# Patient Record
Sex: Male | Born: 1950 | Race: Black or African American | Hispanic: No | Marital: Married | Smoking: Never smoker
Health system: Southern US, Community
[De-identification: ages and names within clinical notes are randomized; demographics above are authoritative.]

## PROBLEM LIST (undated history)

## (undated) DIAGNOSIS — I10 Essential (primary) hypertension: Secondary | ICD-10-CM

## (undated) DIAGNOSIS — E7800 Pure hypercholesterolemia, unspecified: Secondary | ICD-10-CM

## (undated) DIAGNOSIS — R339 Retention of urine, unspecified: Secondary | ICD-10-CM

## (undated) DIAGNOSIS — Z978 Presence of other specified devices: Secondary | ICD-10-CM

---

## 2019-07-21 ENCOUNTER — Other Ambulatory Visit: Payer: Self-pay | Admitting: Family

## 2019-07-21 ENCOUNTER — Ambulatory Visit
Admission: RE | Admit: 2019-07-21 | Discharge: 2019-07-21 | Disposition: A | Discharge status: Home / Self Care | Payer: Medicare Other | Source: Ambulatory Visit | Attending: Family | Admitting: Family

## 2019-07-21 ENCOUNTER — Emergency Department (HOSPITAL_COMMUNITY)
Admission: EM | Admit: 2019-07-21 | Discharge: 2019-07-21 | Discharge status: Absent without leave | Payer: Medicare Other

## 2019-07-21 ENCOUNTER — Other Ambulatory Visit: Payer: Self-pay

## 2019-07-21 DIAGNOSIS — R29898 Other symptoms and signs involving the musculoskeletal system: Secondary | ICD-10-CM

## 2019-07-21 DIAGNOSIS — M545 Low back pain, unspecified: Secondary | ICD-10-CM

## 2019-08-01 ENCOUNTER — Other Ambulatory Visit: Payer: Self-pay

## 2019-11-04 ENCOUNTER — Inpatient Hospital Stay (HOSPITAL_COMMUNITY)
Admission: EM | Admit: 2019-11-04 | Discharge: 2019-11-10 | DRG: 948 | Disposition: A | Discharge status: Home / Self Care | Payer: Medicare Other | Attending: Internal Medicine | Admitting: Internal Medicine

## 2019-11-04 ENCOUNTER — Emergency Department (HOSPITAL_COMMUNITY): Payer: Medicare Other

## 2019-11-04 ENCOUNTER — Encounter (HOSPITAL_COMMUNITY): Payer: Self-pay | Admitting: Emergency Medicine

## 2019-11-04 ENCOUNTER — Other Ambulatory Visit: Payer: Self-pay

## 2019-11-04 DIAGNOSIS — E785 Hyperlipidemia, unspecified: Secondary | ICD-10-CM

## 2019-11-04 DIAGNOSIS — R531 Weakness: Secondary | ICD-10-CM

## 2019-11-04 DIAGNOSIS — G379 Demyelinating disease of central nervous system, unspecified: Secondary | ICD-10-CM | POA: Diagnosis present

## 2019-11-04 DIAGNOSIS — Z79899 Other long term (current) drug therapy: Secondary | ICD-10-CM

## 2019-11-04 DIAGNOSIS — N400 Enlarged prostate without lower urinary tract symptoms: Secondary | ICD-10-CM

## 2019-11-04 DIAGNOSIS — R296 Repeated falls: Secondary | ICD-10-CM | POA: Diagnosis present

## 2019-11-04 DIAGNOSIS — Z20822 Contact with and (suspected) exposure to covid-19: Secondary | ICD-10-CM | POA: Diagnosis present

## 2019-11-04 DIAGNOSIS — R29898 Other symptoms and signs involving the musculoskeletal system: Secondary | ICD-10-CM

## 2019-11-04 DIAGNOSIS — I10 Essential (primary) hypertension: Secondary | ICD-10-CM

## 2019-11-04 DIAGNOSIS — C799 Secondary malignant neoplasm of unspecified site: Secondary | ICD-10-CM

## 2019-11-04 DIAGNOSIS — R269 Unspecified abnormalities of gait and mobility: Secondary | ICD-10-CM | POA: Diagnosis present

## 2019-11-04 DIAGNOSIS — E7800 Pure hypercholesterolemia, unspecified: Secondary | ICD-10-CM | POA: Diagnosis present

## 2019-11-04 DIAGNOSIS — G35 Multiple sclerosis: Secondary | ICD-10-CM | POA: Diagnosis not present

## 2019-11-04 HISTORY — DX: Pure hypercholesterolemia, unspecified: E78.00

## 2019-11-04 HISTORY — DX: Essential (primary) hypertension: I10

## 2019-11-04 LAB — URINALYSIS, ROUTINE W REFLEX MICROSCOPIC
Bacteria, UA: NONE SEEN
Bilirubin Urine: NEGATIVE
Glucose, UA: NEGATIVE mg/dL
Hgb urine dipstick: NEGATIVE
Ketones, ur: NEGATIVE mg/dL
Leukocytes,Ua: NEGATIVE
Nitrite: NEGATIVE
Protein, ur: 30 mg/dL — AB
Specific Gravity, Urine: 1.026 (ref 1.005–1.030)
pH: 5 (ref 5.0–8.0)

## 2019-11-04 LAB — BASIC METABOLIC PANEL
Anion gap: 10 (ref 5–15)
BUN: 15 mg/dL (ref 8–23)
CO2: 27 mmol/L (ref 22–32)
Calcium: 9.8 mg/dL (ref 8.9–10.3)
Chloride: 104 mmol/L (ref 98–111)
Creatinine, Ser: 1.14 mg/dL (ref 0.61–1.24)
GFR calc Af Amer: >60 mL/min (ref >60)
GFR calc non Af Amer: >60 mL/min (ref >60)
Glucose, Bld: 89 mg/dL (ref 70–99)
Potassium: 4.9 mmol/L (ref 3.5–5.1)
Sodium: 141 mmol/L (ref 135–145)

## 2019-11-04 LAB — CBC WITH DIFFERENTIAL/PLATELET
Abs Immature Granulocytes: 0.02 10*3/uL (ref 0.00–0.07)
Basophils Absolute: 0 10*3/uL (ref 0.0–0.1)
Basophils Relative: 0 %
Eosinophils Absolute: 0.1 10*3/uL (ref 0.0–0.5)
Eosinophils Relative: 1 %
HCT: 50.9 % (ref 39.0–52.0)
Hemoglobin: 16.6 g/dL (ref 13.0–17.0)
Immature Granulocytes: 0 %
Lymphocytes Relative: 37 %
Lymphs Abs: 2.6 10*3/uL (ref 0.7–4.0)
MCH: 29 pg (ref 26.0–34.0)
MCHC: 32.6 g/dL (ref 30.0–36.0)
MCV: 88.8 fL (ref 80.0–100.0)
Monocytes Absolute: 0.4 10*3/uL (ref 0.1–1.0)
Monocytes Relative: 6 %
Neutro Abs: 4 10*3/uL (ref 1.7–7.7)
Neutrophils Relative %: 56 %
Platelets: 194 10*3/uL (ref 150–400)
RBC: 5.73 MIL/uL (ref 4.22–5.81)
RDW: 12.3 % (ref 11.5–15.5)
WBC: 7.1 10*3/uL (ref 4.0–10.5)
nRBC: 0 % (ref 0.0–0.2)

## 2019-11-04 MED ORDER — SODIUM CHLORIDE 0.9 % IV SOLN
1000.0000 mg | Freq: Once | INTRAVENOUS | Status: DC
  Filled 2019-11-04: qty 8

## 2019-11-04 MED ORDER — SODIUM CHLORIDE 0.9 % IV SOLN
2.0000 g | Freq: Once | INTRAVENOUS | Status: AC
  Administered 2019-11-04: 2 g via INTRAVENOUS
  Filled 2019-11-04: qty 20

## 2019-11-04 MED ORDER — GADOBUTROL 1 MMOL/ML IV SOLN
10.0000 mL | Freq: Once | INTRAVENOUS | Status: AC | PRN
  Administered 2019-11-04: 9 mL via INTRAVENOUS

## 2019-11-04 NOTE — H&P (Signed)
History and Physical    Bryan Robertson ZDG:[id number] DOB: 08-10-1950 DOA: 11/04/2019  PCP: Assunta Curtis, FNP Patient coming from: Home  Chief Complaint: Right leg weakness  HPI: Bryan Robertson is a 69 y.o. 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  Patient states during his hospitalization at [facility] he was treated with a steroid and felt better afterwards.  States for the past 1 week his symptoms have started again.  He is experiencing weakness in his right leg and numbness in his right hand.  States the numbness has now improved.  He is able to move his right leg but it continues to be weak and he is using a walker to ambulate.  Denies any changes in vision or difficulty with his speech.  He has been vaccinated against Covid.  Denies cough, shortness of breath, nausea, vomiting, abdominal pain, or diarrhea.  Per review of notes under Care Everywhere: Patient was admitted to [facility] in June with complaints of ataxia and difficulty walking.  He had a tick bite a month prior, was initially treated with doxycycline which he was not able to tolerate, later transitioned to amoxicillin.  MRI of the brain was concerning for wide differential including Lyme neuroborreliosis, acute demyelinating disease, vasculitis, sarcoidosis, and less likely neoplasm.  MRI of cervical and thoracic spine without lesions. Spinal tap was positive for oligoclonal bands and it showed high CSF protein 109.  CSF MD panel negative.  CMV, EBV, varicella, HSV CSF PCR is negative.  CSF bacterial and fungal cultures with no growth and Gram stain negative.  RPR negative, HIV negative.  Lyme disease antibody panel negative.  Lyme CSF serology negative.  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  He was seen by neurology clinic and his presentation was felt to be very atypical for MS.  He had further work-up scheduled with brain and cervical MRI, CT chest/abdomen/pelvis, NMO Ab, and MOG Ab.   Brain MRI done at [facility] on 07/22/2019: "1. Multiple rounded lesions within the periventricular and subcortical white matter, with the largest measuring up to 3.5 cm within the right temporal lobe as described above. Several of the lesions demonstrate peripheral restricted diffusion and heterogeneous enhancement in the bilateral frontal lobes. 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 Additional differential considerations include acute demyelinating disease, vasculitis, or sarcoidosis. Neoplasm such as lymphoma would be in the differential although the imaging appearance is not typical. Metastatic disease is also in the differential but considered less likely given that some of the lesions do not enhance and given lack of substantial surrounding edema.  2. Query slightly asymmetric smooth enhancement of the left facial nerve in the temporal bone as it courses towards the stylomastoid foramen."  ED Course: Blood pressure slightly elevated, remainder of vital signs stable.  WBC 7.1, hemoglobin 16.6, hematocrit 50.9, platelet 194K.  Sodium 141, potassium 4.9, chloride 104, bicarb 27, BUN 15, creatinine 1.1, glucose 89.  UA not suggestive of infection.  SARS-CoV-2 PCR test pending.  MRI brain and C-spine showing findings concerning for demyelinating disease. Neurology consulted. Patient was given ceftriaxone.  Review of Systems:  All systems reviewed and apart from history of presenting illness, are negative.  Past Medical History:  Diagnosis Date  . High cholesterol   . Hypertension      History reviewed. No pertinent surgical history.   reports that he has never smoked. He has never used smokeless tobacco. He reports previous alcohol use. He reports previous drug use.  Allergies  Allergen Reactions  . Doxycycline Nausea Only    History reviewed. No pertinent family history.  Prior to Admission medications   Medication Sig Start Date End Date Taking? Authorizing Provider  atorvastatin (LIPITOR) 20 MG tablet Take 20 mg by mouth daily. 10/21/19   [provider]  bethanechol (URECHOLINE) 25 MG tablet Take 25 mg by mouth 4 (four) times daily. 08/04/19   [provider]  brimonidine (ALPHAGAN) 0.2 % ophthalmic solution 1 drop 3 (three) times daily. 09/04/19   [provider]  [employer] ASPIRIN 325 MG tablet Take 325 mg by mouth daily. 10/03/19   [provider]  dorzolamide-timolol (COSOPT) 22.3-6.8 MG/ML ophthalmic solution Place 1 drop into both eyes 2 (two) times daily.  09/21/19   [provider]  hydrALAZINE (APRESOLINE) 25 MG tablet Take 25 mg by mouth 2 (two) times daily. 07/09/19   [provider]  hydrochlorothiazide (HYDRODIURIL) 12.5 MG tablet Take 12.5 mg by mouth every morning. 10/10/19   [provider]  isosorbide dinitrate (ISORDIL) 20 MG tablet Take 20 mg by mouth 2 (two) times daily. 08/04/19   [provider]  KLOR-CON M20 20 MEQ tablet Take 20 mEq by mouth daily. 10/18/19   [provider]  lisinopril (ZESTRIL) 5 MG tablet Take 5 mg by mouth daily. 10/03/19   [provider]  metoprolol succinate (TOPROL-XL) 25 MG 24 hr tablet Take 25 mg by mouth daily. 08/26/19   [provider]  RHOPRESSA 0.02 % SOLN SMARTSIG:1 Drop(s) In Eye(s) Every Evening 08/04/19   [provider]  tamsulosin (FLOMAX) 0.4 MG CAPS capsule Take 0.8 mg by mouth at bedtime. 09/29/19   [provider]    Physical Exam: Vitals:   11/04/19 1053 11/04/19 1251 11/04/19 2345  BP: (!) 162/115  (!) 144/82 (!) 149/62  Pulse: 78 63 66  Resp: 18 16 18   Temp: 98.3 F (36.8 C)    TempSrc: Oral    SpO2: 100% 98% 96%    Physical Exam Constitutional:      General: He is not in acute distress. HENT:     Head: Normocephalic and atraumatic.  Eyes:     Extraocular Movements: Extraocular movements intact.     Conjunctiva/sclera: Conjunctivae normal.  Cardiovascular:     Rate and Rhythm: Normal rate and regular rhythm.     Pulses: Normal pulses.  Pulmonary:     Effort: Pulmonary effort is normal. No respiratory distress.     Breath sounds: No wheezing or rales.  Abdominal:     General: Bowel sounds are normal. There is no distension.     Palpations: Abdomen is soft.     Tenderness: There is no abdominal tenderness.  Musculoskeletal:        General: No swelling or tenderness.     Cervical back: Normal range of motion and neck supple.  Skin:    General: Skin is warm and dry.  Neurological:     Mental Status: He is alert and oriented to person, place, and time.     Comments: Speech fluent, tongue midline, no obvious facial droop Strength 5 out of  5 in bilateral upper and lower extremities. Sensation to light touch intact throughout.     Labs on Admission: I have personally reviewed following labs and imaging studies  CBC: Recent Labs  Lab 11/04/19 1436  WBC 7.1  NEUTROABS 4.0  HGB 16.6  HCT 50.9  MCV 88.8  PLT 800   Basic Metabolic Panel: Recent Labs  Lab 11/04/19 1436  NA 141  K 4.9  CL 104  CO2 27  GLUCOSE 89  BUN 15  CREATININE 1.14  CALCIUM 9.8   GFR: CrCl cannot be calculated (Unknown ideal weight.). Liver Function Tests: No results for input(s): AST, ALT, ALKPHOS, BILITOT, PROT, ALBUMIN in the last 168 hours. No results for input(s): LIPASE, AMYLASE in the last 168 hours. No results for input(s): AMMONIA in the last 168 hours. Coagulation Profile: No results for input(s): INR, PROTIME in the last 168 hours. Cardiac Enzymes: No results for  input(s): CKTOTAL, CKMB, CKMBINDEX, TROPONINI in the last 168 hours. BNP (last 3 results) No results for input(s): PROBNP in the last 8760 hours. HbA1C: No results for input(s): HGBA1C in the last 72 hours. CBG: No results for input(s): GLUCAP in the last 168 hours. Lipid Profile: No results for input(s): CHOL, HDL, LDLCALC, TRIG, CHOLHDL, LDLDIRECT in the last 72 hours. Thyroid Function Tests: No results for input(s): TSH, T4TOTAL, FREET4, T3FREE, THYROIDAB in the last 72 hours. Anemia Panel: Recent Labs    11/04/19 2234  VITAMINB12 357   Urine analysis:    Component Value Date/Time   COLORURINE YELLOW 11/04/2019 2246   APPEARANCEUR CLEAR 11/04/2019 2246   LABSPEC 1.026 11/04/2019 2246   PHURINE 5.0 11/04/2019 2246   GLUCOSEU NEGATIVE 11/04/2019 2246   HGBUR NEGATIVE 11/04/2019 2246   [place] NEGATIVE 11/04/2019 2246   [place] 11/04/2019 2246   PROTEINUR 30 (A) 11/04/2019 2246   NITRITE NEGATIVE 11/04/2019 2246   LEUKOCYTESUR NEGATIVE 11/04/2019 2246    Radiological Exams on Admission: CT Head Wo Contrast  Result Date: 11/04/2019 CLINICAL DATA:  Right leg weakness x2 weeks EXAM: CT HEAD WITHOUT CONTRAST TECHNIQUE: Contiguous axial images were obtained from the base of the skull through the vertex without intravenous contrast. COMPARISON:  07/21/2019 FINDINGS: Brain: More conspicuous hypoattenuation in the right frontal white matter, right periventricular white matter, and a small focus in posterior left frontal white matter suggesting subacute evolving infarcts. No acute hemorrhage, midline shift, mass, mass effect, or hydrocephalus. Vascular: No hyperdense vessel or unexpected calcification. Skull: Normal. Negative for fracture or focal lesion. Sinuses/Orbits: No acute finding. Other: None IMPRESSION: Progressive cerebral white matter changes as above. No hemorrhage or other acute finding. Electronically Signed   By: Lucrezia Europe M.D.   On: 11/04/2019 14:26   MR  ANGIO HEAD WO CONTRAST  Result Date: 11/05/2019 CLINICAL DATA:  Right leg weakness and right hand numbness EXAM: MRA HEAD WITHOUT CONTRAST TECHNIQUE: Angiographic images of the Circle of Willis were obtained using MRA technique without intravenous contrast. COMPARISON:  None. FINDINGS: POSTERIOR CIRCULATION: --Vertebral arteries: Normal V4 segments. --Inferior cerebellar arteries: Normal. --Basilar artery: Normal. --Superior cerebellar arteries: Normal. --Posterior cerebral arteries: Normal. The right PCA is predominantly supplied by the posterior communicating artery. ANTERIOR CIRCULATION: --Intracranial internal carotid arteries: Normal. --Anterior cerebral arteries (ACA): Normal. Both A1 segments are present. Patent anterior communicating artery (a-comm). --Middle cerebral arteries (MCA): Normal. 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 Electronically Signed   By: Ulyses Jarred M.D.   On: 11/05/2019 00:33   MR Brain W and Wo  Contrast  Result Date: 11/04/2019 CLINICAL DATA:  Myelopathy. Acute or progressive weakness. Concern for multiple sclerosis. EXAM: MRI HEAD WITHOUT AND WITH CONTRAST TECHNIQUE: Multiplanar, multiecho pulse sequences of the brain and surrounding structures were obtained without and with intravenous contrast. CONTRAST:  39mL GADAVIST GADOBUTROL 1 MMOL/ML IV SOLN COMPARISON:  None. FINDINGS: Brain: No acute infarct. There are multiple round/ovoid T2/FLAIR hyperintense lesions in bilateral frontoparietal, right temporal, and left basal ganglia white matter. Most of these lesions are periventricular in location and many of the lesions are along the callososeptal interface. One of the larger lesions along the right periventricular white matter demonstrates restricted diffusion and irregular peripheral enhancement. An additional smaller lesion in the left corona radiata demonstrates peripheral restricted diffusion and mild peripheral enhancement. A lesion in  the right frontal periventricular white matter extends to the subcortical white matter. There are small lesions within the right middle cerebellar peduncle (for example see series 6 and 5, image 8). No substantial mass effect associated with these lesions. No midline shift. No acute hemorrhage. No hydrocephalus. Vascular: Flow voids are maintained at the skull base. Skull and upper cervical spine: Characterized on concurrent MRI of the cervical spine Sinuses/Orbits: Retention versus cyst versus polyp within the right frontal sinus. Scattered ethmoid air cell opacification. Left maxillary sinus and right sphenoid sinus retention cysts. No air-fluid levels. Other: No mastoid effusions IMPRESSION: There are numerous T2/FLAIR hyperintense infratentorial and supratentorial lesions, which are detailed above and compatible with demyelinating disease. A few of the periventricular lesions restrict diffusion and peripherally enhance, compatible with active demyelination. Electronically Signed   By: Margaretha Sheffield MD   On: 11/04/2019 16:56   MR Cervical Spine W or Wo Contrast  Result Date: 11/04/2019 CLINICAL DATA:  Myelopathy, acute or progressive. Concern for multiple sclerosis. EXAM: MRI CERVICAL SPINE WITHOUT AND WITH CONTRAST TECHNIQUE: Multiplanar and multiecho pulse sequences of the cervical spine, to include the craniocervical junction and cervicothoracic junction, were obtained without and with intravenous contrast. CONTRAST:  11mL GADAVIST GADOBUTROL 1 MMOL/ML IV SOLN COMPARISON:  None. FINDINGS: Alignment: Physiologic. Vertebrae: Vertebral body heights are maintained. No focal marrow edema to suggest acute fracture, osteomyelitis, or abnormal bone lesion. Edema associated with right C5-C6 facet arthropathy. Cord: There is short segment, focal, non expansile wT2 hyperintensity within the lateral and dorsal cord at C2-C3 and C3-C4 (see series 14, image 13 and 14) Posterior Fossa, vertebral arteries, paraspinal  tissues: No acute abnormality. Disc levels: C2-C3: No significant disc protrusion, foraminal stenosis, or canal stenosis. C3-C4: Small posterior disc osteophyte complex and left greater than right facet and uncovertebral hypertrophy. Mild left foraminal stenosis. No significant canal stenosis. C4-C5: Small posterior disc osteophyte complex and bilateral facet and uncovertebral hypertrophy. Mild canal stenosis and mild bilateral foraminal stenosis. C5-C6: Small posterior disc osteophyte complex and right greater than left facet and uncovertebral hypertrophy. Moderate right and mild left foraminal stenosis. C6-C7: Right greater than left facet and uncovertebral hypertrophy with mild bilateral foraminal stenosis. No significant canal stenosis. C7-T1: No significant canal or foraminal stenosis. Visualized upper thoracic spine: Incompletely characterized without evidence of significant canal or foraminal stenosis. IMPRESSION: 1. Focal short-segment T2 hyperintensity within the dorsolateral right cord at C2-C3 and C3-C4, which is compatible with demyelinating disease given findings on concurrent MRI head. No clear enhancement in these regions to suggest active demyelination. 2. 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 Electronically Signed   By: Margaretha Sheffield MD  On: 11/04/2019 17:07    Assessment/Plan Principal Problem:   Right leg weakness Active Problems:   Essential hypertension   Hyperlipidemia   BPH (benign prostatic hyperplasia)   Right leg weakness, right hand numbness -Patient presenting with complaints of right lower extremity weakness and right hand numbness.  Exam done by neurology notable for left hand grip weakness and left hip flexion weakness.  MRI brain and C-spine with findings concerning for a demyelinating disease. -Patient was seen by neurology and his presentation was felt to be atypical for MS.  His  presentation was felt to be either due to vasculitis or atypical infection or a paraneoplastic process.  Recommended getting infectious disease involved given borderline RMSF IgG IFA and positive Ehrlichia IgG IFA testing during infectious disease office visit in August to see if that is contributing.  If ID is not concerned about a CNS infection, recommended treating with IV methylprednisolone 1000 mg daily x5 days.  Neurology has ordered MRI head without contrast, CT chest/abdomen/pelvis for malignancy screen, Lyme C6 antibody test and serum, serum paraneoplastic antibody panel, and vitamin B12 level.  Appreciate recommendations. -Patient received ceftriaxone in the ED -discontinue.  Recommended first-line treatment for Ehrlichiosis is doxycycline.  Discussed starting the patient on doxycycline but he refuses stating he was not able to tolerate it in the past due to severe nausea.  Denies any other reaction from doxycycline such as rash or symptoms of anaphylaxis.  Explained to the patient that doxycycline could be prescribed with an antiemetic in case if he experiences nausea but he again adamantly refuses to take this drug under any circumstance.  As such, will start rifampin 300 mg every 12 hours. -Please consult ID in a.m.  Hypertension: Systolic currently in the 140s. -Resume home medications after pharmacy med rec is done  Hyperlipidemia -Resume home statin after pharmacy med rec is done  BPH -Resume home Flomax after pharmacy med rec is done  DVT prophylaxis: Lovenox Code Status: Full code Family Communication: No family available at this time. Disposition Plan: Status is: Inpatient  Remains inpatient appropriate because:IV treatments appropriate due to intensity of illness or inability to take PO and Inpatient level of care appropriate due to severity of illness   Dispo: The patient is from: Home              Anticipated d/c is to: Home              Anticipated d/c date is: 3 days               Patient currently is not medically stable to d/c.  The medical decision making on this patient was of high complexity and the patient is at high risk for clinical deterioration, therefore this is a level 3 visit.  Shela Leff MD [facility]  If 7PM-7AM, please contact night-coverage [email or web address]  11/05/2019, 1:16 AM

## 2019-11-04 NOTE — Consult Note (Addendum)
NEUROLOGY CONSULTATION NOTE   Date of service: November 04, 2019 Patient Name: Bryan Robertson MRN:  [id number] DOB:  1950-03-27 Reason for consult: "MS exacerbation"  History of Present Illness  Bryan Robertson is a 69 y.o. 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  He reports R leg weakness, gait difficult and R hand numbness. Symptom started about a week ago. He was using a walker and had to switch to a cane. Reports that ths felt very similar to prior episode in June, he was admitted ot [facility] for that. Workup with multiple periventricular and subcortical white matter lesions, several of these lesions demonstrated restricted diffusion and heterogenous enhancement in the bilateral frontal lobes. It also showed slight asymmetric smooth enhancement in the left facial nerve. There were no lesions in the cervical and in the thoracic spinal cord. The spinal tap was positive for oligoclonal bands and it showed high CSF protein 109. RPR negative, HIV negative, Lyme Ab panels on serum negative. Lyme CSF serology negative. He was on Doxycycline which he was not able to tolerate and therefore switched to Amoxicillin.  The patient received IV solumedrol for 3 days during hospitalization with some improvement of his symptoms.  I do see that ID saw him in office back in august 2021. He had IFA testing for Ehlichia and for [facility] mountain spotted fever. 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 I do not see any follow up from ID afterwards or any further testing.  He was seen by Neurology clinic too and thought that the presentation is very atypical for MS given his age, the characteristics of the noted lesions including restricted diffusion and heterogenous enhancement in BL frontal lobes and enhancement of the Left facial nerve.  He had further workup scheduled with CT of his Chest, Abdomen and Pelvis as well as a repeat MRI  Brain and C spine and serum NMO panel and MOG Ab.  [facility] given the persistence of his symptoms.     ROS   Constitutional Denies weight loss, fever and chills.  HEENT Denies changes in vision and hearing.  Respiratory Denies SOB and cough.   CV Denies palpitations and CP   GI Denies abdominal pain, nausea, vomiting and diarrhea.   GU Denies dysuria and urinary frequency.   MSK Denies myalgia and joint pain.   Skin Denies rash and pruritus.   Neurological Denies headache and syncope.   Psychiatric Denies recent changes in mood. Denies anxiety and depression.    Past History   Past Medical History:  Diagnosis Date  . High cholesterol   . Hypertension    History reviewed. No pertinent surgical history. No family history on file. Social History   Socioeconomic History  . Marital status: Married    Spouse name: Not on file  . Number of children: Not on file  . Years of education: Not on file  . Highest education level: Not on file  Occupational History  . Not on file  Tobacco Use  . Smoking status: Never Smoker  . Smokeless tobacco: Never Used  Substance and Sexual Activity  . Alcohol use: Not Currently  . Drug use: Not Currently  . Sexual activity: Not on file  Other Topics Concern  . Not on file  Social History Narrative  . Not on file   Social Determinants of Health   Financial Resource Strain:   . Difficulty  of Paying Living Expenses: Not on file  Food Insecurity:   . Worried About Charity fundraiser in the Last Year: Not on file  . Ran Out of Food in the Last Year: Not on file  Transportation Needs:   . Lack of Transportation (Medical): Not on file  . Lack of Transportation (Non-Medical): Not on file  Physical Activity:   . Days of Exercise per Week: Not on file  . Minutes of Exercise per Session: Not on file  Stress:   . Feeling of Stress : Not on file  Social Connections:   . Frequency of Communication with Friends and Family: Not on  file  . Frequency of Social Gatherings with Friends and Family: Not on file  . Attends Religious Services: Not on file  . Active Member of Clubs or Organizations: Not on file  . Attends Archivist Meetings: Not on file  . Marital Status: Not on file   Not on File  Medications  (Not in a hospital admission)    Vitals   Vitals:   11/04/19 1053 11/04/19 1251  BP: (!) 162/115 (!) 144/82  Pulse: 78 63  Resp: 18 16  Temp: 98.3 F (36.8 C)   TempSrc: Oral   SpO2: 100% 98%     There is no height or weight on file to calculate BMI.  Physical Exam   General: Laying comfortably in bed; in no acute distress.  HENT: Normal oropharynx and mucosa. Normal external appearance of ears and nose.  Neck: Supple, no pain or tenderness  CV: No JVD. No peripheral edema.  Pulmonary: Symmetric Chest rise. Normal respiratory effort.  Abdomen: Soft to touch, non-tender.  Ext: No cyanosis, edema, or deformity  Skin: No rash. Normal palpation of skin.   Musculoskeletal: Normal digits and nails by inspection. No clubbing.   Neurologic Examination  Mental status/Cognition: Alert, oriented to self, place, month and year, good attention. Speech/language: Fluent, comprehension intact, object naming intact, repetition intact. Cranial nerves:   CN II Pupils equal and reactive to light, no VF deficits   CN III,IV,VI EOM intact, no gaze preference or deviation, no nystagmus   CN V normal sensation in V1, V2, and V3 segments bilaterally   CN VII no asymmetry, no nasolabial fold flattening   CN VIII normal hearing to speech   CN IX & X normal palatal elevation, no uvular deviation   CN XI 5/5 head turn and 5/5 shoulder shrug bilaterally   CN XII midline tongue protrusion   Motor:  Muscle bulk: normal, tone increased, pronator drift LUE Mvmt Root Nerve  Muscle Right Left Comments  SA C5/6 Ax Deltoid 5 5   EF C5/6 Mc Biceps 5 5   EE C6/7/8 Rad Triceps 5 5   WF C6/7 Med FCR 5 4   WE C7/8  PIN ECU 5 4   F Ab C8/T1 U ADM/FDI 5 4   HF L1/2/3 Fem Illopsoas 5 4   KE L2/3/4 Fem Quad 5 5   DF L4/5 D Peron Tib Ant 5 5   PF S1/2 Tibial Grc/Sol 5 5    Reflexes:  Right Left Comments  Pectoralis      Biceps (C5/6) 1 1   Brachioradialis (C5/6) 1 1    Triceps (C6/7) 1 1    Patellar (L3/4) 1 1    Achilles (S1) 1 1    Hoffman      Plantar mute mute   Jaw jerk    Sensation:  Light touch Subjective decrease in sensation in L hand   Pin prick    Temperature    Vibration   Proprioception    Coordination/Complex Motor:  - Finger to Nose with mild ataxia on the left - Heel to shin unable to assess  Labs   CBC:  Recent Labs  Lab 11/04/19 1436  WBC 7.1  NEUTROABS 4.0  HGB 16.6  HCT 50.9  MCV 88.8  PLT 496    Basic Metabolic Panel:  Lab Results  Component Value Date   NA 141 11/04/2019   K 4.9 11/04/2019   CO2 27 11/04/2019   GLUCOSE 89 11/04/2019   BUN 15 11/04/2019   CREATININE 1.14 11/04/2019   CALCIUM 9.8 11/04/2019   GFRNONAA >60 11/04/2019   GFRAA >60 11/04/2019   Lipid Panel: No results found for: LDLCALC HgbA1c: No results found for: HGBA1C Urine Drug Screen: No results found for: LABOPIA, COCAINSCRNUR, LABBENZ, AMPHETMU, THCU, LABBARB  Alcohol Level No results found for: [facility]   Imaging and Diagnostic studies  CT Head Wo Contrast: Progressive cerebral white matter changes as above. No hemorrhage or other acute finding.  MR Brain W and Wo Contrast: There are numerous T2/FLAIR hyperintense infratentorial and supratentorial lesions, which are detailed above and compatible with demyelinating disease. A few of the periventricular lesions restrict diffusion and peripherally enhance, compatible with active demyelination.  MR Cervical Spine W or Wo Contrast 1. Focal short-segment T2 hyperintensity within the dorsolateral right cord at C2-C3 and C3-C4, which is compatible with demyelinating disease given findings on concurrent MRI head. No clear enhancement  in these regions to suggest active demyelination. 2. 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   Impression   Bryan Robertson is a 69 y.o. 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 His neurologic examination is notable for Left hand grip weakness and Left hip flexion weakness. MRI Brain with and without contrast and MRI C spine with peripheral enhancement of a few periventricular lesions with restricted diffusion concerning for demyelination. His overall presentation is concerning for a demyelinating process or its mimicer.  There are several atypical features on his overall presentation. He is an above 75 male and the incidence of new onset MS is very low in this population. He has elevated CSF protein which can be seen on MS relapses but it is not typically this high and there is slight asymmetric smooth enhancement of the left facial nerve which is not consistent with multiple sclerosis. Also the enhancement in some of the lesions is heterogenous which again is somewhat atypical for MS. My primary suspicion is either a vasculitis or atypical infection vs paraneoplastic process.  Recommendations  - recommend having ID weigh in on his positive Ehrlichia and RMSF IgG IFA testing to see if that could be contributory. - If ID is not particularly concerned about a CNS infection, would recommend IV Methylprednisone 1000mg  daily x 5 days. - I ordered MR Angio head without contrast - I ordered CT Chest, Abdomen and Pelvis for malignancy screen - I ordered Lyme C6 Ab test on serum and serum paraneoplastic Ab panel. - Vit B12 deficiency in a small number of cases can present with inflammatory appearing CNS disease, given his old age, I ordered Vit B12  levels. ______________________________________________________________________   Thank you for the opportunity to take part in  the care of this patient. If you have any further questions, please contact the neurology consultation attending.  Signed,  [place] Pager Number [phone]

## 2019-11-04 NOTE — ED Triage Notes (Signed)
Pt. Stated, Bryan Robertson not been able to be mobile cause of my rt. Leg being weak. This started almost 2 weeks ago.

## 2019-11-04 NOTE — ED Notes (Signed)
Bryan Robertson, daughter, [phone] would like an update when available

## 2019-11-04 NOTE — ED Provider Notes (Signed)
[facility] Provider Note   CSN: [id number] Arrival date & time: 11/04/19  1046     History Chief Complaint  Patient presents with  . Extremity Weakness    Bryan Robertson is a 69 y.o. male.  HPI    Patient presents with his daughter who assists with the HPI. Patient presents with concern of right leg weakness, gait difficulty, right hand numbness. In addition to obtaining details from the patient, his daughter, and I reviewed his chart, including documentation from hospitalization at [facility], with plan for upcoming MRI. He notes that after hospitalization in June, he was improved, following a course of steroids. However, now over the past 2 weeks patient has developed new right leg weakness, gait difficulty, right hand numbness. Patient saw his neurologist as an outpatient 1 week ago and is scheduled for MRI in 2 weeks. With worsening symptoms over the past days he presents for evaluation. No other weakness, no difficulty speaking, thinking, seeing. No recent medication change, diet change. Past Medical History:  Diagnosis Date  . High cholesterol   . Hypertension     There are no problems to display for this patient.   History reviewed. No pertinent surgical history.     No family history on file.  Social History   Tobacco Use  . Smoking status: Never Smoker  . Smokeless tobacco: Never Used  Substance Use Topics  . Alcohol use: Not Currently  . Drug use: Not Currently    Home Medications Prior to Admission medications   Not on File    Allergies    Patient has no allergy information on record.  Review of Systems   Review of Systems  Constitutional:       Per HPI, otherwise negative  HENT:       Per HPI, otherwise negative  Respiratory:       Per HPI, otherwise negative  Cardiovascular:       Per HPI, otherwise negative  Gastrointestinal: Negative for vomiting.  Endocrine:       Negative  aside from HPI  Genitourinary:       Neg aside from HPI   Musculoskeletal:       Per HPI, otherwise negative  Skin: Negative.   Neurological: Positive for weakness and numbness. Negative for syncope.    Physical Exam Updated Vital Signs BP (!) 144/82 (BP Location: Left Arm)   Pulse 63   Temp 98.3 F (36.8 C) (Oral)   Resp 16   SpO2 98%   Physical Exam Vitals and nursing note reviewed.  Constitutional:      General: He is not in acute distress.    Appearance: He is well-developed.  HENT:     Head: Normocephalic and atraumatic.  Eyes:     Conjunctiva/sclera: Conjunctivae normal.  Cardiovascular:     Rate and Rhythm: Normal rate and regular rhythm.  Pulmonary:     Effort: Pulmonary effort is normal. No respiratory distress.     Breath sounds: No stridor.  Abdominal:     General: There is no distension.  Skin:    General: Skin is warm and dry.  Neurological:     Mental Status: He is alert and oriented to person, place, and time.     Comments: Right lower extremity weakness proximal, distal, 4/5.  Right hand sensory loss, subjective only.  No facial asymmetry, speech is clear, brief, appropriate.     ED Results / Procedures / Treatments   Labs (  all labs ordered are listed, but only abnormal results are displayed) Labs Reviewed  CBC WITH DIFFERENTIAL/PLATELET  BASIC METABOLIC PANEL  URINALYSIS, ROUTINE W REFLEX MICROSCOPIC    Radiology CT Head Wo Contrast  Result Date: 11/04/2019 CLINICAL DATA:  Right leg weakness x2 weeks EXAM: CT HEAD WITHOUT CONTRAST TECHNIQUE: Contiguous axial images were obtained from the base of the skull through the vertex without intravenous contrast. COMPARISON:  07/21/2019 FINDINGS: Brain: More conspicuous hypoattenuation in the right frontal white matter, right periventricular white matter, and a small focus in posterior left frontal white matter suggesting subacute evolving infarcts. No acute hemorrhage, midline shift, mass, mass effect, or  hydrocephalus. Vascular: No hyperdense vessel or unexpected calcification. Skull: Normal. Negative for fracture or focal lesion. Sinuses/Orbits: No acute finding. Other: None IMPRESSION: Progressive cerebral white matter changes as above. No hemorrhage or other acute finding. Electronically Signed   By: Lucrezia Europe M.D.   On: 11/04/2019 14:26    Procedures Procedures (including critical care time)  Medications Ordered in ED Medications - No data to display   EMR REVIEWED, neuro note from last month below:  Assessment: 69 year old male with hx of HTN and hyperlipidemia who comes to the clinic to be evaluated for possible multiple sclerosis. The patient reports that on 07/2019 he developed weakness in the right leg and problems with ambulation. He was evaluated at the hospital and he was found to have multiple periventricular and subcortical white matter lesions, several of these lesions demonstrated restricted diffusion and heterogenous enhancement in the bilateral frontal lobes. It also showed slight asymmetric smooth enhancement in the left facial nerve. There were no lesions in the cervical and in the thoracic spinal cord. The spinal tap was positive for oligoclonal bands and it showed high CSF protein 109. The CSF cytology was negative.  The patient received IV solumedrol for 3 days during hospitalization with some improvement of his symptoms, however in the last 3 weeks he has noticed worsening weakness in the right arm and leg and new numbness in the left hand.  The patient has seen ID and they have ruled out and infectious etiology. Although the lesions on the brain MRI look demyelinating there are atypical features on his overall presentation. He has elevated CSF protein which can be seen on MS relapses but it is not typically this high and there is slight asymmetric smooth enhancement of the left facial nerve which is not consistent with multiple sclerosis.  Since he has new neurological  symptoms I would like to obtain follow up imaging: brain and cervical MRI w/wo.     ED Course  I have reviewed the triage vital signs and the nursing notes.  Pertinent labs & imaging results that were available during my care of the patient were reviewed by me and considered in my medical decision making (see chart for details).    MDM Rules/Calculators/A&P  Adult male with recent neurologic phenomenon now presents with likely recurrence, right leg weakness, right hand numbness. Patient's initial head CT was abnormal suggesting CNS lesion. Some suggestion of demyelination versus other phenomena such as MS, with stroke considered. Patient is awake, alert, hemodynamically unremarkable. Patient's evaluation started, as above, with broad differential, requiring MR, labs, CT performed in triage, notable as above. Patient will require repeat evaluation, possible neuro consult pending results.   Dr. Kathrynn Humble is aware of the patient.   Final Clinical Impression(s) / ED Diagnoses Final diagnoses:  Weakness     Carmin Muskrat, MD 11/04/19 1540

## 2019-11-05 ENCOUNTER — Other Ambulatory Visit: Payer: Self-pay

## 2019-11-05 ENCOUNTER — Inpatient Hospital Stay (HOSPITAL_COMMUNITY): Payer: Medicare Other

## 2019-11-05 DIAGNOSIS — R29898 Other symptoms and signs involving the musculoskeletal system: Secondary | ICD-10-CM

## 2019-11-05 DIAGNOSIS — I10 Essential (primary) hypertension: Secondary | ICD-10-CM

## 2019-11-05 DIAGNOSIS — N400 Enlarged prostate without lower urinary tract symptoms: Secondary | ICD-10-CM

## 2019-11-05 DIAGNOSIS — E785 Hyperlipidemia, unspecified: Secondary | ICD-10-CM

## 2019-11-05 LAB — VITAMIN B12: Vitamin B-12: 357 pg/mL (ref 180–914)

## 2019-11-05 LAB — RESPIRATORY PANEL BY RT PCR (FLU A&B, COVID)
Influenza A by PCR: NEGATIVE
Influenza B by PCR: NEGATIVE
SARS Coronavirus 2 by RT PCR: NEGATIVE

## 2019-11-05 LAB — HIV ANTIBODY (ROUTINE TESTING W REFLEX): HIV Screen 4th Generation wRfx: NONREACTIVE

## 2019-11-05 MED ORDER — POLYETHYLENE GLYCOL 3350 17 G PO PACK
17.0000 g | PACK | Freq: Every day | ORAL | Status: DC | PRN
  Administered 2019-11-05 – 2019-11-06 (×2): 17 g via ORAL
  Filled 2019-11-05 (×2): qty 1

## 2019-11-05 MED ORDER — DORZOLAMIDE HCL-TIMOLOL MAL 2-0.5 % OP SOLN
1.0000 [drp] | Freq: Two times a day (BID) | OPHTHALMIC | Status: DC
  Administered 2019-11-05 – 2019-11-10 (×10): 1 [drp] via OPHTHALMIC
  Filled 2019-11-05: qty 10

## 2019-11-05 MED ORDER — ENOXAPARIN SODIUM 40 MG/0.4ML ~~LOC~~ SOLN
40.0000 mg | SUBCUTANEOUS | Status: DC
  Administered 2019-11-06 – 2019-11-10 (×5): 40 mg via SUBCUTANEOUS
  Filled 2019-11-05 (×9): qty 0.4

## 2019-11-05 MED ORDER — IOHEXOL 300 MG/ML  SOLN
100.0000 mL | Freq: Once | INTRAMUSCULAR | Status: AC | PRN
  Administered 2019-11-05: 100 mL via INTRAVENOUS

## 2019-11-05 MED ORDER — METOPROLOL SUCCINATE ER 25 MG PO TB24
25.0000 mg | ORAL_TABLET | Freq: Every day | ORAL | Status: DC
  Administered 2019-11-06 – 2019-11-10 (×5): 25 mg via ORAL
  Filled 2019-11-05 (×6): qty 1

## 2019-11-05 MED ORDER — ATORVASTATIN CALCIUM 10 MG PO TABS
20.0000 mg | ORAL_TABLET | Freq: Every day | ORAL | Status: DC
  Administered 2019-11-05 – 2019-11-09 (×5): 20 mg via ORAL
  Filled 2019-11-05 (×5): qty 2

## 2019-11-05 MED ORDER — CHLORHEXIDINE GLUCONATE CLOTH 2 % EX PADS
6.0000 | MEDICATED_PAD | Freq: Every day | CUTANEOUS | Status: DC
  Administered 2019-11-07 – 2019-11-10 (×2): 6 via TOPICAL

## 2019-11-05 MED ORDER — LISINOPRIL 10 MG PO TABS
10.0000 mg | ORAL_TABLET | Freq: Every day | ORAL | Status: DC
  Administered 2019-11-06 – 2019-11-10 (×5): 10 mg via ORAL
  Filled 2019-11-05 (×6): qty 1

## 2019-11-05 MED ORDER — SODIUM CHLORIDE 0.9 % IV SOLN
300.0000 mg | Freq: Two times a day (BID) | INTRAVENOUS | Status: DC
  Administered 2019-11-05 (×2): 300 mg via INTRAVENOUS
  Filled 2019-11-05 (×3): qty 300

## 2019-11-05 MED ORDER — TAMSULOSIN HCL 0.4 MG PO CAPS
0.8000 mg | ORAL_CAPSULE | Freq: Every day | ORAL | Status: DC
  Administered 2019-11-05 – 2019-11-09 (×5): 0.8 mg via ORAL
  Filled 2019-11-05 (×5): qty 2

## 2019-11-05 MED ORDER — ASPIRIN 325 MG PO TABS
325.0000 mg | ORAL_TABLET | Freq: Every day | ORAL | Status: DC
  Administered 2019-11-05 – 2019-11-09 (×5): 325 mg via ORAL
  Filled 2019-11-05 (×5): qty 1

## 2019-11-05 MED ORDER — IOHEXOL 9 MG/ML PO SOLN
500.0000 mL | ORAL | Status: AC
  Administered 2019-11-05: 500 mL via ORAL

## 2019-11-05 MED ORDER — BRIMONIDINE TARTRATE 0.2 % OP SOLN
1.0000 [drp] | Freq: Three times a day (TID) | OPHTHALMIC | Status: DC
  Administered 2019-11-05 – 2019-11-10 (×14): 1 [drp] via OPHTHALMIC
  Filled 2019-11-05: qty 5

## 2019-11-05 MED ORDER — NETARSUDIL DIMESYLATE 0.02 % OP SOLN
1.0000 [drp] | Freq: Every day | OPHTHALMIC | Status: DC
  Administered 2019-11-07 – 2019-11-09 (×3): 1 [drp] via OPHTHALMIC

## 2019-11-05 NOTE — ED Notes (Signed)
  Patient called out and went to see what was needed.  Patient upset saying that he is uncomfortable in the hospital bed and wants to sit in the chair.  I explained to the patient that its not safe for him to sit in the chair because he is a high fall risk and hooked up to the cardiac monitor.  Patient was irritated and stated he wanted to go home.  Tried to find a solution to appease the patient but he told me to leave him alone.

## 2019-11-05 NOTE — ED Notes (Signed)
Tele  Breakfast Ordered 

## 2019-11-05 NOTE — Progress Notes (Signed)
Patient c/o unable to void, pt attempted to in bathroom without success, bladder scan of 500cc. MD paged. MD [place]on Gully with verbal order to place foley catheter. Camera operator, [place], witness.

## 2019-11-05 NOTE — Consult Note (Signed)
[facility]  Total days of antibiotics 2               Reason for Consult: weakness    Referring Physician: [place]  Principal Problem:   Right leg weakness Active Problems:   Essential hypertension   Hyperlipidemia   BPH (benign prostatic hyperplasia)    HPI: Bryan Robertson is a 69 y.o. male with hx of HTN, HLD, BPH, who reports having difficulty with his gait in June 2021, found to have right leg weakness. 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 MRI of brain showing lesions with LP with WBC 9, gram stain negative, protein 109, glu 66. Kappa +, lyme CSF serology negative as well as negative CMV, EBV, varicella, HSV, RPR negative, and HIV negative. He was treated with both abtx and steroids over the summer but had more relief with steroids. abtx discontinued due to negative tests. He has followed up at [facility] to reinsure that this was not tickborne illness. Serologies negative in care everywhere  Patient returns with worsening right leg weakness though exam appears normal. ID asked to weigh in on need for antibiotics   Past Medical History:  Diagnosis Date  . High cholesterol   . Hypertension     Allergies:  Allergies  Allergen Reactions  . Doxycycline Nausea Only    Current antibiotics:   MEDICATIONS: . enoxaparin (LOVENOX) injection  40 mg Subcutaneous Q24H    Social History   Tobacco Use  . Smoking status: Never Smoker  . Smokeless tobacco: Never Used  Substance Use Topics  . Alcohol use: Not Currently  . Drug use: Not Currently    History reviewed. No pertinent family history.  Review of Systems  Constitutional: Negative for fever, chills, diaphoresis, activity change, appetite change, fatigue and unexpected weight change.  HENT: Negative for congestion, sore throat, rhinorrhea, sneezing, trouble swallowing and sinus pressure.  Eyes:  Negative for photophobia and visual disturbance.  Respiratory: Negative for cough, chest tightness, shortness of breath, wheezing and stridor.  Cardiovascular: Negative for chest pain, palpitations and leg swelling.  Gastrointestinal: Negative for nausea, vomiting, abdominal pain, diarrhea, constipation, blood in stool, abdominal distention and anal bleeding.  Genitourinary: Negative for dysuria, hematuria, flank pain and difficulty urinating.  Musculoskeletal: +right leg weakness Negative for myalgias, back pain, joint swelling, arthralgias and gait problem.  Skin: Negative for color change, pallor, rash and wound.  Neurological: Negative for dizziness, tremors, weakness and light-headedness.  Hematological: Negative for adenopathy. Does not bruise/bleed easily.  Psychiatric/Behavioral: Negative for behavioral problems, confusion, sleep disturbance, dysphoric mood, decreased concentration and agitation.      OBJECTIVE: Pulse Rate:  [55-85] 72 (10/03 0700) Resp:  [11-21] 20 (10/03 1400) BP: (127-180)/(62-136) 170/98 (10/03 1400) SpO2:  [92 %-99 %] 99 % (10/03 0700) Weight:  [93 kg] 93 kg (10/02 1617) Physical Exam  Constitutional: He is oriented to person, place, and time. He appears well-developed and well-nourished. No distress.  HENT:  Mouth/Throat: Oropharynx is clear and moist. No oropharyngeal exudate.  Cardiovascular: Normal rate, regular rhythm and normal heart sounds. Exam reveals no gallop and no friction rub.  No murmur heard.  Pulmonary/Chest: Effort normal and breath sounds normal. No respiratory distress. He has no wheezes.  Abdominal: Soft. Bowel sounds are normal. He exhibits no distension. There is no tenderness.  Lymphadenopathy:  He has no cervical adenopathy.  Neurological: He is alert  and oriented to person, place, and time.  Skin: Skin is warm and dry. No rash noted. No erythema.  Psychiatric: He has a normal mood and affect. His behavior is normal.      LABS: Results for orders placed or performed during the hospital encounter of 11/04/19 (from the past 48 hour(s))  CBC with Differential     Status: None   Collection Time: 11/04/19  2:36 PM  Result Value Ref Range   WBC 7.1 4.0 - 10.5 K/uL   RBC 5.73 4.22 - 5.81 MIL/uL   Hemoglobin 16.6 13.0 - 17.0 g/dL   HCT 50.9 39 - 52 %   MCV 88.8 80.0 - 100.0 fL   MCH 29.0 26.0 - 34.0 pg   MCHC 32.6 30.0 - 36.0 g/dL   RDW 12.3 11.5 - 15.5 %   Platelets 194 150 - 400 K/uL    Comment: REPEATED TO VERIFY PLATELET COUNT CONFIRMED BY SMEAR    nRBC 0.0 0.0 - 0.2 %   Neutrophils Relative % 56 %   Neutro Abs 4.0 1.7 - 7.7 K/uL   Lymphocytes Relative 37 %   Lymphs Abs 2.6 0.7 - 4.0 K/uL   Monocytes Relative 6 %   Monocytes Absolute 0.4 0 - 1 K/uL   Eosinophils Relative 1 %   Eosinophils Absolute 0.1 0 - 0 K/uL   Basophils Relative 0 %   Basophils Absolute 0.0 0 - 0 K/uL   Immature Granulocytes 0 %   Abs Immature Granulocytes 0.02 0.00 - 0.07 K/uL    Comment: Performed at [facility], [address] [place], [place] [address]  Basic metabolic panel     Status: None   Collection Time: 11/04/19  2:36 PM  Result Value Ref Range   Sodium 141 135 - 145 mmol/L   Potassium 4.9 3.5 - 5.1 mmol/L    Comment: HEMOLYSIS AT THIS LEVEL MAY AFFECT RESULT   Chloride 104 98 - 111 mmol/L   CO2 27 22 - 32 mmol/L   Glucose, Bld 89 70 - 99 mg/dL    Comment: Glucose reference range applies only to samples taken after fasting for at least 8 hours.   BUN 15 8 - 23 mg/dL   Creatinine, Ser 1.14 0.61 - 1.24 mg/dL   Calcium 9.8 8.9 - 10.3 mg/dL   GFR calc non Af Amer >60 >60 mL/min   GFR calc Af Amer >60 >60 mL/min   Anion gap 10 5 - 15    Comment: Performed at [facility] [address], [place], [place] [address]  Vitamin B12     Status: None   Collection Time: 11/04/19 10:34 PM  Result Value Ref Range   Vitamin B-12 357 180 - 914 pg/mL    Comment: (NOTE) This assay is not validated for  testing neonatal or myeloproliferative syndrome specimens for Vitamin B12 levels. Performed at [facility], [place] [address] [place], [place] [address]   Urinalysis, Routine w reflex microscopic Urine, Random     Status: Abnormal   Collection Time: 11/04/19 10:46 PM  Result Value Ref Range   Color, Urine YELLOW YELLOW   APPearance CLEAR CLEAR   Specific Gravity, Urine 1.026 1.005 - 1.030   pH 5.0 5.0 - 8.0   Glucose, UA NEGATIVE NEGATIVE mg/dL   Hgb urine dipstick NEGATIVE NEGATIVE   Bilirubin Urine NEGATIVE NEGATIVE   Ketones, ur NEGATIVE NEGATIVE mg/dL   Protein, ur 30 (A) NEGATIVE mg/dL   Nitrite  NEGATIVE NEGATIVE   Leukocytes,Ua NEGATIVE NEGATIVE   RBC / HPF 0-5 0 - 5 RBC/hpf   WBC, UA 0-5 0 - 5 WBC/hpf   Bacteria, UA NONE SEEN NONE SEEN    Comment: Performed at [facility], [address], [place], [place] [address]  Miscellaneous [facility] test (send-out)     Status: None   Collection Time: 11/04/19 11:54 PM  Result Value Ref Range   [facility] test code [id number]    [facility] test name LYME DISEASE, ANTIBODY TOTAL W/ REFLEX    Source ([facility]) SERUM RED TOP     Comment: Performed at [facility], [place] [address], [place], [place] [address]   Misc [facility] result PENDING   Miscellaneous [facility] test (send-out)     Status: None   Collection Time: 11/04/19 11:55 PM  Result Value Ref Range   [facility] test code [id number]    [facility] test name TO MAYO PARANEOPLASTIC PANEL ANTIBODY    Source ([facility]) SERUM RED TOP     Comment: Performed at [facility], [place] [address] [place], [place] [address]   Misc [facility] result PENDING   Respiratory Panel by RT PCR (Flu A&B, Covid) - Vein     Status: None   Collection Time: 11/04/19 11:55 PM   Specimen: Vein; Nasopharyngeal  Result Value Ref Range   SARS Coronavirus 2 by RT PCR NEGATIVE NEGATIVE    Comment: (NOTE) SARS-CoV-2 target nucleic acids are NOT DETECTED.  The SARS-CoV-2 RNA is generally detectable in upper  respiratoy specimens during the acute phase of infection. The lowest concentration of SARS-CoV-2 viral copies this assay can detect is 131 copies/mL. A negative result does not preclude SARS-Cov-2 infection and should not be used as the sole basis for treatment or other patient management decisions. A negative result may occur with  improper specimen collection/handling, submission of specimen other than nasopharyngeal swab, presence of viral mutation(s) within the areas targeted by this assay, and inadequate number of viral copies (<131 copies/mL). A negative result must be combined with clinical observations, patient history, and epidemiological information. The expected result is Negative.  Fact Sheet for Patients:  PinkCheek.be  Fact Sheet for Healthcare Providers:  GravelBags.it  This test is no t yet approved or cleared by the [place] FDA and  has been authorized for detection and/or diagnosis of SARS-CoV-2 by FDA under an Emergency Use Authorization (EUA). This EUA will remain  in effect (meaning this test can be used) for the duration of the COVID-19 declaration under Section 564(b)(1) of the Act, 21 U.S.C. section 360bbb-3(b)(1), unless the authorization is terminated or revoked sooner.     Influenza A by PCR NEGATIVE NEGATIVE   Influenza B by PCR NEGATIVE NEGATIVE    Comment: (NOTE) The Xpert Xpress SARS-CoV-2/FLU/RSV assay is intended as an aid in  the diagnosis of influenza from Nasopharyngeal swab specimens and  should not be used as a sole basis for treatment. Nasal washings and  aspirates are unacceptable for Xpert Xpress SARS-CoV-2/FLU/RSV  testing.  Fact Sheet for Patients: PinkCheek.be  Fact Sheet for Healthcare Providers: GravelBags.it  This test is not yet approved or cleared by the [place] FDA and  has been authorized for  detection and/or diagnosis of SARS-CoV-2 by  FDA under an Emergency Use Authorization (EUA). This EUA will remain  in effect (meaning this test can be used) for the duration of the  Covid-19 declaration under Section 564(b)(1) of the Act, 21  U.S.C. section 360bbb-3(b)(1),  unless the authorization is  terminated or revoked. Performed at [facility], [place] [address] [place], [place] [address]   HIV Antibody (routine testing w rflx)     Status: None   Collection Time: 11/05/19  1:13 AM  Result Value Ref Range   HIV Screen 4th Generation wRfx Non Reactive Non Reactive    Comment: Performed at [facility], [place] [address], [place], [place] [address]    MICRO: Reviewed outside records IMAGING: CT Head Wo Contrast  Result Date: 11/04/2019 CLINICAL DATA:  Right leg weakness x2 weeks EXAM: CT HEAD WITHOUT CONTRAST TECHNIQUE: Contiguous axial images were obtained from the base of the skull through the vertex without intravenous contrast. COMPARISON:  07/21/2019 FINDINGS: Brain: More conspicuous hypoattenuation in the right frontal white matter, right periventricular white matter, and a small focus in posterior left frontal white matter suggesting subacute evolving infarcts. No acute hemorrhage, midline shift, mass, mass effect, or hydrocephalus. Vascular: No hyperdense vessel or unexpected calcification. Skull: Normal. Negative for fracture or focal lesion. Sinuses/Orbits: No acute finding. Other: None IMPRESSION: Progressive cerebral white matter changes as above. No hemorrhage or other acute finding. Electronically Signed   By: Lucrezia Europe M.D.   On: 11/04/2019 14:26   MR ANGIO HEAD WO CONTRAST  Result Date: 11/05/2019 CLINICAL DATA:  Right leg weakness and right hand numbness EXAM: MRA HEAD WITHOUT CONTRAST TECHNIQUE: Angiographic images of the Circle of Willis were obtained using MRA technique without intravenous contrast. COMPARISON:  None. FINDINGS: POSTERIOR CIRCULATION:  --Vertebral arteries: Normal V4 segments. --Inferior cerebellar arteries: Normal. --Basilar artery: Normal. --Superior cerebellar arteries: Normal. --Posterior cerebral arteries: Normal. The right PCA is predominantly supplied by the posterior communicating artery. ANTERIOR CIRCULATION: --Intracranial internal carotid arteries: Normal. --Anterior cerebral arteries (ACA): Normal. Both A1 segments are present. Patent anterior communicating artery (a-comm). --Middle cerebral arteries (MCA): Normal. 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 Electronically Signed   By: Ulyses Jarred M.D.   On: 11/05/2019 00:33   MR Brain W and Wo Contrast  Result Date: 11/04/2019 CLINICAL DATA:  Myelopathy. Acute or progressive weakness. Concern for multiple sclerosis. EXAM: MRI HEAD WITHOUT AND WITH CONTRAST TECHNIQUE: Multiplanar, multiecho pulse sequences of the brain and surrounding structures were obtained without and with intravenous contrast. CONTRAST:  29mL GADAVIST GADOBUTROL 1 MMOL/ML IV SOLN COMPARISON:  None. FINDINGS: Brain: No acute infarct. There are multiple round/ovoid T2/FLAIR hyperintense lesions in bilateral frontoparietal, right temporal, and left basal ganglia white matter. Most of these lesions are periventricular in location and many of the lesions are along the callososeptal interface. One of the larger lesions along the right periventricular white matter demonstrates restricted diffusion and irregular peripheral enhancement. An additional smaller lesion in the left corona radiata demonstrates peripheral restricted diffusion and mild peripheral enhancement. A lesion in the right frontal periventricular white matter extends to the subcortical white matter. There are small lesions within the right middle cerebellar peduncle (for example see series 6 and 5, image 8). No substantial mass effect associated with these lesions. No midline shift. No acute hemorrhage. No hydrocephalus.  Vascular: Flow voids are maintained at the skull base. Skull and upper cervical spine: Characterized on concurrent MRI of the cervical spine Sinuses/Orbits: Retention versus cyst versus polyp within the right frontal sinus. Scattered ethmoid air cell opacification. Left maxillary sinus and right sphenoid sinus retention cysts. No air-fluid levels. Other: No mastoid effusions IMPRESSION: There are numerous T2/FLAIR hyperintense infratentorial and supratentorial lesions, which are detailed above and  compatible with demyelinating disease. A few of the periventricular lesions restrict diffusion and peripherally enhance, compatible with active demyelination. Electronically Signed   By: Margaretha Sheffield MD   On: 11/04/2019 16:56   MR Cervical Spine W or Wo Contrast  Result Date: 11/04/2019 CLINICAL DATA:  Myelopathy, acute or progressive. Concern for multiple sclerosis. EXAM: MRI CERVICAL SPINE WITHOUT AND WITH CONTRAST TECHNIQUE: Multiplanar and multiecho pulse sequences of the cervical spine, to include the craniocervical junction and cervicothoracic junction, were obtained without and with intravenous contrast. CONTRAST:  67mL GADAVIST GADOBUTROL 1 MMOL/ML IV SOLN COMPARISON:  None. FINDINGS: Alignment: Physiologic. Vertebrae: Vertebral body heights are maintained. No focal marrow edema to suggest acute fracture, osteomyelitis, or abnormal bone lesion. Edema associated with right C5-C6 facet arthropathy. Cord: There is short segment, focal, non expansile wT2 hyperintensity within the lateral and dorsal cord at C2-C3 and C3-C4 (see series 14, image 13 and 14) Posterior Fossa, vertebral arteries, paraspinal tissues: No acute abnormality. Disc levels: C2-C3: No significant disc protrusion, foraminal stenosis, or canal stenosis. C3-C4: Small posterior disc osteophyte complex and left greater than right facet and uncovertebral hypertrophy. Mild left foraminal stenosis. No significant canal stenosis. C4-C5: Small  posterior disc osteophyte complex and bilateral facet and uncovertebral hypertrophy. Mild canal stenosis and mild bilateral foraminal stenosis. C5-C6: Small posterior disc osteophyte complex and right greater than left facet and uncovertebral hypertrophy. Moderate right and mild left foraminal stenosis. C6-C7: Right greater than left facet and uncovertebral hypertrophy with mild bilateral foraminal stenosis. No significant canal stenosis. C7-T1: No significant canal or foraminal stenosis. Visualized upper thoracic spine: Incompletely characterized without evidence of significant canal or foraminal stenosis. IMPRESSION: 1. Focal short-segment T2 hyperintensity within the dorsolateral right cord at C2-C3 and C3-C4, which is compatible with demyelinating disease given findings on concurrent MRI head. No clear enhancement in these regions to suggest active demyelination. 2. 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 Electronically Signed   By: Margaretha Sheffield MD   On: 11/04/2019 17:07   CT CHEST ABDOMEN PELVIS W CONTRAST  Result Date: 11/05/2019 CLINICAL DATA:  Demyelinating disorder suspected, malignancy search EXAM: CT CHEST, ABDOMEN, AND PELVIS WITH CONTRAST TECHNIQUE: Multidetector CT imaging of the chest, abdomen and pelvis was performed following the standard protocol during bolus administration of intravenous contrast. CONTRAST:  184mL OMNIPAQUE IOHEXOL 300 MG/ML SOLN, additional oral enteric contrast COMPARISON:  None. FINDINGS: CT CHEST FINDINGS Cardiovascular: No significant vascular findings. Normal heart size. No pericardial effusion. Mediastinum/Nodes: No enlarged mediastinal, hilar, or axillary lymph nodes. Thyroid gland, trachea, and esophagus demonstrate no significant findings. Lungs/Pleura: Lungs are clear. No pleural effusion or pneumothorax. Musculoskeletal: No chest wall mass or suspicious bone lesions identified.  CT ABDOMEN PELVIS FINDINGS Hepatobiliary: No solid liver abnormality is seen. No gallstones, gallbladder wall thickening, or biliary dilatation. Pancreas: Unremarkable. No pancreatic ductal dilatation or surrounding inflammatory changes. Spleen: Normal in size without significant abnormality. Adrenals/Urinary Tract: Adrenal glands are unremarkable. There is a large exophytic cyst of the superior pole of the right kidney measuring 12.8 cm. Kidneys are otherwise normal, without renal calculi, solid lesion, or hydronephrosis. Mild thickening of the urinary bladder. Stomach/Bowel: There is a submucosal hypodensity of the gastric fundus measuring approximately 2.7 x 2.6 cm (series 3, image 50). Appendix appears normal. No evidence of bowel wall thickening, distention, or inflammatory changes. Generally large burden of stool throughout the colon and rectum. Vascular/Lymphatic: Scattered aortic atherosclerosis. No enlarged abdominal or pelvic  lymph nodes. Reproductive: Mild prostatomegaly. Urolift implants in the prostate. Other: No abdominal wall hernia or abnormality. No abdominopelvic ascites. Musculoskeletal: No acute or significant osseous findings. IMPRESSION: 1. No definite evidence of mass or lymphadenopathy in the chest, abdomen, or pelvis. 2. There is a submucosal hypodensity of the gastric fundus measuring approximately 2.7 x 2.6 cm, of uncertain nature. Gastric malignancy difficult to exclude. Consider endoscopy to further evaluate. 3. Large benign cyst of the superior pole of the right kidney measuring approximately 12.8 cm, which may be symptomatic due to size. 4. Mild prostatomegaly. Mild thickening of the urinary bladder, likely due to chronic outlet obstruction. 5. Aortic Atherosclerosis (ICD10-I70.0). Electronically Signed   By: Eddie Candle M.D.   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 -recommend to stop antibiotics - suspect that subtle right leg weakness is due to demyelinating process that is undergoing evaluation - recommend to touch base with his [facility] neurologist to see what scans have been done/need to be done - and need for burst of steroids?  - will sign off  Spent 55 min reviewing old records and counseling with patient

## 2019-11-05 NOTE — Plan of Care (Signed)
  Problem: Education: Goal: Knowledge of General Education information will improve Description: Including pain rating scale, medication(s)/side effects and non-pharmacologic comfort measures Reactivated   

## 2019-11-05 NOTE — ED Notes (Addendum)
Pt has 550ml on bladder scan. Dr is notified

## 2019-11-05 NOTE — Progress Notes (Signed)
PROGRESS NOTE    Bryan Robertson  ZHY:[id number] DOB: 11-Jul-1950 DOA: 11/04/2019 PCP: Assunta Curtis, FNP   Brief Narrative:  Bryan Robertson is a fairly unpleasant 69 y.o. 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  Patient states during his hospitalization at [facility] he was treated with a steroid and felt better afterwards.  Worsening weakness in his right leg and numbness in his right hand x1 week now resolving numbness - using walker to ambulate safely.  Denies any changes in vision or difficulty with his speech.  He has been vaccinated against Covid.  Denies cough, shortness of breath, nausea, vomiting, abdominal pain, or diarrhea.  Under Care Everywhere: Patient was admitted to [facility] in June with complaints of ataxia and difficulty walking.  He had a tick bite a month prior, was initially treated with doxycycline which he was not able to tolerate, later transitioned to amoxicillin.  MRI of the brain was concerning for wide differential including Lyme neuroborreliosis, acute demyelinating disease, vasculitis, sarcoidosis, and less likely neoplasm.  MRI of cervical and thoracic spine without lesions. Spinal tap was positive for oligoclonal bands and it showed high CSF protein 109.  CSF MD panel negative.  CMV, EBV, varicella, HSV CSF PCR is negative.  CSF bacterial and fungal cultures with no growth and Gram stain negative.  RPR negative, HIV negative.  Lyme disease antibody panel negative.  Lyme CSF serology negative.  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  He had further work-up scheduled with brain and cervical MRI, CT chest/abdomen/pelvis, NMO Ab, and MOG Ab.   Brain MRI done at [facility] on 07/22/2019: "1. Multiple rounded lesions within the periventricular and subcortical white matter, with the largest measuring up to 3.5 cm within the right temporal lobe as described above. Several of the lesions demonstrate peripheral restricted diffusion and heterogeneous enhancement in the bilateral frontal lobes. 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 Additional differential considerations include acute demyelinating disease, vasculitis, or sarcoidosis. Neoplasm such as lymphoma would be in the differential although the imaging appearance is not typical. Metastatic disease is also in the differential but considered less likely given that some of the lesions do not enhance and given lack of substantial surrounding edema.  2. Query slightly asymmetric smooth enhancement of the left facial nerve in the temporal bone as it courses towards the stylomastoid foramen."  ED Course: Blood pressure slightly elevated, remainder of vital signs stable.  WBC 7.1, hemoglobin 16.6, hematocrit 50.9, platelet 194K.  Sodium 141, potassium 4.9, chloride 104, bicarb 27, BUN 15, creatinine 1.1, glucose 89.  UA not suggestive of infection.  SARS-CoV-2 PCR test pending.  MRI brain and C-spine showing findings concerning for demyelinating disease. Neurology consulted. Patient was given ceftriaxone.  Assessment & Plan:   Principal Problem:   Right leg weakness Active Problems:   Essential hypertension   Hyperlipidemia   BPH (benign prostatic hyperplasia)  Acute on chronic ambulatory dysfunction with recurrent falls in the setting of right leg weakness, right hand numbness,  POA - Previous MRI brain and C-spine with findings concerning for a demyelinating disease. -Neurology  following, indicates his presentation was felt to be atypical for MS.   - His presentation was felt to be either due to vasculitis or atypical infection or a paraneoplastic process.   - Infectious disease asked to evaluate given history of RMSF IgG IFA and positive Ehrlichia IgG IFA testing during infectious disease office visit in August to see if that is contributing.  - If infectious work-up is negative would consider high-dose steroids given previous improvement at [facility] on steroids, patient was also treated with antibiotics which confounds his already somewhat complex history. - Repeat MRI head without contrast, CT chest/abdomen/pelvis for malignancy screen. Follow Lyme C6 antibody test and serum, serum paraneoplastic antibody panel, and vitamin B12 level. - Patient received single dose ceftriaxone in the ED - discontinued at admission.  Continue rifampin -patient at right refused doxycycline.  Hypertension: Systolic currently in the 140s. -Continue home medications  Hyperlipidemia -Continue home medications  BPH, with lower urinary tract syndrome -Patient reports stopping his home Flomax, when asked why he reports "because I wanted to" -Resume Flomax, patient required in and out catheter this morning with greater than 500 cc of urine   DVT prophylaxis: Lovenox Code Status: Full code Family Communication: No family available at this time  Status is: Inpatient  Dispo: The patient is from: Home              Anticipated d/c is to: To be determined              Anticipated d/c date is: Likely 61 to 72 hours pending clinical course              Patient currently not medically stable for discharge  Consultants:   Neurology, infectious disease  Procedures:   None planned  Antimicrobials:  Rifampin  Subjective: No acute issues or events overnight, patient markedly noncompliant with history, review of systems or physical exam this morning, somewhat belligerent verbally to  myself nursing staff and family in the room.  Objective: Vitals:   11/05/19 0445 11/05/19 0500 11/05/19 0530 11/05/19 0545  BP: (!) 152/80 (!) 157/76 (!) 147/75 (!) 148/72  Pulse: 61 61 60 64  Resp: 13 11 13 14   Temp:      TempSrc:      SpO2: 98% 97% 97% 96%   No intake or output data in the 24 hours ending 11/05/19 0802 There were no vitals filed for this visit.  Examination:  General: Resting comfortably in bed, No acute distress. HEENT:  Normocephalic atraumatic.  Sclerae nonicteric, noninjected.  Extraocular movements intact bilaterally. Neck:  Without mass or deformity.  Trachea is midline. Lungs:  Clear to auscultate bilaterally without rhonchi, wheeze, or rales. Heart:  Regular rate and rhythm.  Without murmurs, rubs, or gallops. Abdomen:  Soft, nontender, nondistended.  Without guarding or rebound. Extremities: Without cyanosis, clubbing, edema, or obvious deformity. Vascular:  Dorsalis pedis and posterior tibial pulses palpable bilaterally. Skin:  Warm and dry, no erythema, no ulcerations.  Data Reviewed: I have personally reviewed following labs and imaging studies  CBC: Recent Labs  Lab 11/04/19 1436  WBC 7.1  NEUTROABS 4.0  HGB 16.6  HCT 50.9  MCV 88.8  PLT 811   Basic Metabolic Panel: Recent Labs  Lab 11/04/19 1436  NA 141  K 4.9  CL 104  CO2 27  GLUCOSE 89  BUN 15  CREATININE 1.14  CALCIUM 9.8  GFR: CrCl cannot be calculated (Unknown ideal weight.). Liver Function Tests: No results for input(s): AST, ALT, ALKPHOS, BILITOT, PROT, ALBUMIN in the last 168 hours. No results for input(s): LIPASE, AMYLASE in the last 168 hours. No results for input(s): AMMONIA in the last 168 hours. Coagulation Profile: No results for input(s): INR, PROTIME in the last 168 hours. Cardiac Enzymes: No results for input(s): CKTOTAL, CKMB, CKMBINDEX, TROPONINI in the last 168 hours. BNP (last 3 results) No results for input(s): PROBNP in the last 8760  hours. HbA1C: No results for input(s): HGBA1C in the last 72 hours. CBG: No results for input(s): GLUCAP in the last 168 hours. Lipid Profile: No results for input(s): CHOL, HDL, LDLCALC, TRIG, CHOLHDL, LDLDIRECT in the last 72 hours. Thyroid Function Tests: No results for input(s): TSH, T4TOTAL, FREET4, T3FREE, THYROIDAB in the last 72 hours. Anemia Panel: Recent Labs    11/04/19 2234  VITAMINB12 357   Sepsis Labs: No results for input(s): PROCALCITON, LATICACIDVEN in the last 168 hours.  Recent Results (from the past 240 hour(s))  Respiratory Panel by RT PCR (Flu A&B, Covid) - Vein     Status: None   Collection Time: 11/04/19 11:55 PM   Specimen: Vein; Nasopharyngeal  Result Value Ref Range Status   SARS Coronavirus 2 by RT PCR NEGATIVE NEGATIVE Final    Comment: (NOTE) SARS-CoV-2 target nucleic acids are NOT DETECTED.  The SARS-CoV-2 RNA is generally detectable in upper respiratoy specimens during the acute phase of infection. The lowest concentration of SARS-CoV-2 viral copies this assay can detect is 131 copies/mL. A negative result does not preclude SARS-Cov-2 infection and should not be used as the sole basis for treatment or other patient management decisions. A negative result may occur with  improper specimen collection/handling, submission of specimen other than nasopharyngeal swab, presence of viral mutation(s) within the areas targeted by this assay, and inadequate number of viral copies (<131 copies/mL). A negative result must be combined with clinical observations, patient history, and epidemiological information. The expected result is Negative.  Fact Sheet for Patients:  PinkCheek.be  Fact Sheet for Healthcare Providers:  GravelBags.it  This test is no t yet approved or cleared by the [place] FDA and  has been authorized for detection and/or diagnosis of SARS-CoV-2 by FDA under an Emergency  Use Authorization (EUA). This EUA will remain  in effect (meaning this test can be used) for the duration of the COVID-19 declaration under Section 564(b)(1) of the Act, 21 U.S.C. section 360bbb-3(b)(1), unless the authorization is terminated or revoked sooner.     Influenza A by PCR NEGATIVE NEGATIVE Final   Influenza B by PCR NEGATIVE NEGATIVE Final    Comment: (NOTE) The Xpert Xpress SARS-CoV-2/FLU/RSV assay is intended as an aid in  the diagnosis of influenza from Nasopharyngeal swab specimens and  should not be used as a sole basis for treatment. Nasal washings and  aspirates are unacceptable for Xpert Xpress SARS-CoV-2/FLU/RSV  testing.  Fact Sheet for Patients: PinkCheek.be  Fact Sheet for Healthcare Providers: GravelBags.it  This test is not yet approved or cleared by the [place] FDA and  has been authorized for detection and/or diagnosis of SARS-CoV-2 by  FDA under an Emergency Use Authorization (EUA). This EUA will remain  in effect (meaning this test can be used) for the duration of the  Covid-19 declaration under Section 564(b)(1) of the Act, 21  U.S.C. section 360bbb-3(b)(1), unless the authorization is  terminated or revoked. Performed at [facility],  [address] [place], [place] [address]          Radiology Studies: CT Head Wo Contrast  Result Date: 11/04/2019 CLINICAL DATA:  Right leg weakness x2 weeks EXAM: CT HEAD WITHOUT CONTRAST TECHNIQUE: Contiguous axial images were obtained from the base of the skull through the vertex without intravenous contrast. COMPARISON:  07/21/2019 FINDINGS: Brain: More conspicuous hypoattenuation in the right frontal white matter, right periventricular white matter, and a small focus in posterior left frontal white matter suggesting subacute evolving infarcts. No acute hemorrhage, midline shift, mass, mass effect, or hydrocephalus. Vascular: No hyperdense  vessel or unexpected calcification. Skull: Normal. Negative for fracture or focal lesion. Sinuses/Orbits: No acute finding. Other: None IMPRESSION: Progressive cerebral white matter changes as above. No hemorrhage or other acute finding. Electronically Signed   By: Lucrezia Europe M.D.   On: 11/04/2019 14:26   MR ANGIO HEAD WO CONTRAST  Result Date: 11/05/2019 CLINICAL DATA:  Right leg weakness and right hand numbness EXAM: MRA HEAD WITHOUT CONTRAST TECHNIQUE: Angiographic images of the Circle of Willis were obtained using MRA technique without intravenous contrast. COMPARISON:  None. FINDINGS: POSTERIOR CIRCULATION: --Vertebral arteries: Normal V4 segments. --Inferior cerebellar arteries: Normal. --Basilar artery: Normal. --Superior cerebellar arteries: Normal. --Posterior cerebral arteries: Normal. The right PCA is predominantly supplied by the posterior communicating artery. ANTERIOR CIRCULATION: --Intracranial internal carotid arteries: Normal. --Anterior cerebral arteries (ACA): Normal. Both A1 segments are present. Patent anterior communicating artery (a-comm). --Middle cerebral arteries (MCA): Normal. 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 Electronically Signed   By: Ulyses Jarred M.D.   On: 11/05/2019 00:33   MR Brain W and Wo Contrast  Result Date: 11/04/2019 CLINICAL DATA:  Myelopathy. Acute or progressive weakness. Concern for multiple sclerosis. EXAM: MRI HEAD WITHOUT AND WITH CONTRAST TECHNIQUE: Multiplanar, multiecho pulse sequences of the brain and surrounding structures were obtained without and with intravenous contrast. CONTRAST:  61mL GADAVIST GADOBUTROL 1 MMOL/ML IV SOLN COMPARISON:  None. FINDINGS: Brain: No acute infarct. There are multiple round/ovoid T2/FLAIR hyperintense lesions in bilateral frontoparietal, right temporal, and left basal ganglia white matter. Most of these lesions are periventricular in location and many of the lesions are along the  callososeptal interface. One of the larger lesions along the right periventricular white matter demonstrates restricted diffusion and irregular peripheral enhancement. An additional smaller lesion in the left corona radiata demonstrates peripheral restricted diffusion and mild peripheral enhancement. A lesion in the right frontal periventricular white matter extends to the subcortical white matter. There are small lesions within the right middle cerebellar peduncle (for example see series 6 and 5, image 8). No substantial mass effect associated with these lesions. No midline shift. No acute hemorrhage. No hydrocephalus. Vascular: Flow voids are maintained at the skull base. Skull and upper cervical spine: Characterized on concurrent MRI of the cervical spine Sinuses/Orbits: Retention versus cyst versus polyp within the right frontal sinus. Scattered ethmoid air cell opacification. Left maxillary sinus and right sphenoid sinus retention cysts. No air-fluid levels. Other: No mastoid effusions IMPRESSION: There are numerous T2/FLAIR hyperintense infratentorial and supratentorial lesions, which are detailed above and compatible with demyelinating disease. A few of the periventricular lesions restrict diffusion and peripherally enhance, compatible with active demyelination. Electronically Signed   By: Margaretha Sheffield MD   On: 11/04/2019 16:56   MR Cervical Spine W or Wo Contrast  Result Date: 11/04/2019 CLINICAL DATA:  Myelopathy, acute or progressive. Concern for multiple sclerosis. EXAM: MRI CERVICAL SPINE WITHOUT AND WITH CONTRAST TECHNIQUE:  Multiplanar and multiecho pulse sequences of the cervical spine, to include the craniocervical junction and cervicothoracic junction, were obtained without and with intravenous contrast. CONTRAST:  61mL GADAVIST GADOBUTROL 1 MMOL/ML IV SOLN COMPARISON:  None. FINDINGS: Alignment: Physiologic. Vertebrae: Vertebral body heights are maintained. No focal marrow edema to suggest  acute fracture, osteomyelitis, or abnormal bone lesion. Edema associated with right C5-C6 facet arthropathy. Cord: There is short segment, focal, non expansile wT2 hyperintensity within the lateral and dorsal cord at C2-C3 and C3-C4 (see series 14, image 13 and 14) Posterior Fossa, vertebral arteries, paraspinal tissues: No acute abnormality. Disc levels: C2-C3: No significant disc protrusion, foraminal stenosis, or canal stenosis. C3-C4: Small posterior disc osteophyte complex and left greater than right facet and uncovertebral hypertrophy. Mild left foraminal stenosis. No significant canal stenosis. C4-C5: Small posterior disc osteophyte complex and bilateral facet and uncovertebral hypertrophy. Mild canal stenosis and mild bilateral foraminal stenosis. C5-C6: Small posterior disc osteophyte complex and right greater than left facet and uncovertebral hypertrophy. Moderate right and mild left foraminal stenosis. C6-C7: Right greater than left facet and uncovertebral hypertrophy with mild bilateral foraminal stenosis. No significant canal stenosis. C7-T1: No significant canal or foraminal stenosis. Visualized upper thoracic spine: Incompletely characterized without evidence of significant canal or foraminal stenosis. IMPRESSION: 1. Focal short-segment T2 hyperintensity within the dorsolateral right cord at C2-C3 and C3-C4, which is compatible with demyelinating disease given findings on concurrent MRI head. No clear enhancement in these regions to suggest active demyelination. 2. 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 Electronically Signed   By: Margaretha Sheffield MD   On: 11/04/2019 17:07   Scheduled Meds: . enoxaparin (LOVENOX) injection  40 mg Subcutaneous Q24H  . iohexol  500 mL Oral Q1H   Continuous Infusions: . rifampin (RIFADIN) IVPB Stopped (11/05/19 0512)     LOS: 1 day   Time spent: 26min  Javares Kaufhold C Karalyne Nusser,  DO [facility]  If 7PM-7AM, please contact night-coverage [email or web address]  11/05/2019, 8:02 AM

## 2019-11-06 DIAGNOSIS — G35 Multiple sclerosis: Secondary | ICD-10-CM

## 2019-11-06 DIAGNOSIS — R29898 Other symptoms and signs involving the musculoskeletal system: Secondary | ICD-10-CM | POA: Diagnosis not present

## 2019-11-06 LAB — CBC
HCT: 46.7 % (ref 39.0–52.0)
Hemoglobin: 15.3 g/dL (ref 13.0–17.0)
MCH: 28.9 pg (ref 26.0–34.0)
MCHC: 32.8 g/dL (ref 30.0–36.0)
MCV: 88.3 fL (ref 80.0–100.0)
Platelets: 236 10*3/uL (ref 150–400)
RBC: 5.29 MIL/uL (ref 4.22–5.81)
RDW: 12.2 % (ref 11.5–15.5)
WBC: 6.2 10*3/uL (ref 4.0–10.5)
nRBC: 0 % (ref 0.0–0.2)

## 2019-11-06 LAB — COMPREHENSIVE METABOLIC PANEL
ALT: 13 U/L (ref 0–44)
AST: 18 U/L (ref 15–41)
Albumin: 3.6 g/dL (ref 3.5–5.0)
Alkaline Phosphatase: 47 U/L (ref 38–126)
Anion gap: 9 (ref 5–15)
BUN: 14 mg/dL (ref 8–23)
CO2: 28 mmol/L (ref 22–32)
Calcium: 9.2 mg/dL (ref 8.9–10.3)
Chloride: 103 mmol/L (ref 98–111)
Creatinine, Ser: 1.16 mg/dL (ref 0.61–1.24)
GFR calc Af Amer: >60 mL/min (ref >60)
GFR calc non Af Amer: >60 mL/min (ref >60)
Glucose, Bld: 134 mg/dL — ABNORMAL HIGH (ref 70–99)
Potassium: 3.3 mmol/L — ABNORMAL LOW (ref 3.5–5.1)
Sodium: 140 mmol/L (ref 135–145)
Total Bilirubin: 1.2 mg/dL (ref 0.3–1.2)
Total Protein: 6.6 g/dL (ref 6.5–8.1)

## 2019-11-06 LAB — MISC LABCORP TEST (SEND OUT): Labcorp test code: 160325

## 2019-11-06 MED ORDER — SODIUM CHLORIDE 0.9 % IV SOLN
1000.0000 mg | Freq: Every day | INTRAVENOUS | Status: AC
  Administered 2019-11-06 – 2019-11-10 (×5): 1000 mg via INTRAVENOUS
  Filled 2019-11-06 (×5): qty 8

## 2019-11-06 MED ORDER — PANTOPRAZOLE SODIUM 40 MG IV SOLR
40.0000 mg | INTRAVENOUS | Status: DC
  Administered 2019-11-06 – 2019-11-08 (×3): 40 mg via INTRAVENOUS
  Filled 2019-11-06 (×4): qty 40

## 2019-11-06 NOTE — Progress Notes (Signed)
PROGRESS NOTE    Bryan Robertson  WCB:[id number] DOB: 09/14/1950 DOA: 11/04/2019 PCP: Assunta Curtis, FNP   Brief Narrative:  Bryan Robertson is a fairly unpleasant 69 y.o. 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  Patient states during his hospitalization at [facility] he was treated with a steroid and felt better afterwards.  Worsening weakness in his right leg and numbness in his right hand x1 week now resolving numbness - using walker to ambulate safely.  Denies any changes in vision or difficulty with his speech.  He has been vaccinated against Covid.  Denies cough, shortness of breath, nausea, vomiting, abdominal pain, or diarrhea.  Under Care Everywhere: Patient was admitted to [facility] in June with complaints of ataxia and difficulty walking.  He had a tick bite a month prior, was initially treated with doxycycline which he was not able to tolerate, later transitioned to amoxicillin.  MRI of the brain was concerning for wide differential including Lyme neuroborreliosis, acute demyelinating disease, vasculitis, sarcoidosis, and less likely neoplasm.  MRI of cervical and thoracic spine without lesions. Spinal tap was positive for oligoclonal bands and it showed high CSF protein 109.  CSF MD panel negative.  CMV, EBV, varicella, HSV CSF PCR is negative.  CSF bacterial and fungal cultures with no growth and Gram stain negative.  RPR negative, HIV negative.  Lyme disease antibody panel negative.  Lyme CSF serology negative.  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  He had further work-up scheduled with brain and cervical MRI, CT chest/abdomen/pelvis, NMO Ab, and MOG Ab.   Brain MRI done at [facility] on 07/22/2019: "1. Multiple rounded lesions within the periventricular and subcortical white matter, with the largest measuring up to 3.5 cm within the right temporal lobe as described above. Several of the lesions demonstrate peripheral restricted diffusion and heterogeneous enhancement in the bilateral frontal lobes. 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 Additional differential considerations include acute demyelinating disease, vasculitis, or sarcoidosis. Neoplasm such as lymphoma would be in the differential although the imaging appearance is not typical. Metastatic disease is also in the differential but considered less likely given that some of the lesions do not enhance and given lack of substantial surrounding edema.  2. Query slightly asymmetric smooth enhancement of the left facial nerve in the temporal bone as it courses towards the stylomastoid foramen."  ED Course: Blood pressure slightly elevated, remainder of vital signs stable.  WBC 7.1, hemoglobin 16.6, hematocrit 50.9, platelet 194K.  Sodium 141, potassium 4.9, chloride 104, bicarb 27, BUN 15, creatinine 1.1, glucose 89.  UA not suggestive of infection.  SARS-CoV-2 PCR test pending.  MRI brain and C-spine showing findings concerning for demyelinating disease. Neurology consulted. Patient was given ceftriaxone.  Assessment & Plan:   Principal Problem:   Right leg weakness Active Problems:   Essential hypertension   Hyperlipidemia   BPH (benign prostatic hyperplasia)  Acute on chronic ambulatory dysfunction with recurrent falls in the setting of right leg weakness, right hand numbness,  POA - Previous MRI brain and C-spine with findings concerning for a demyelinating disease. - Neurology  following, indicates his presentation was felt to be atypical for MS.   - His presentation was felt to be either due to vasculitis or atypical infection or a paraneoplastic process.   - Infectious disease asked to evaluate given his complex history and previous testing, ID recommends to stop antibiotics given no current infectious process ongoing and previous testing outpatient ID was essentially resolved/negative. - Repeat MRI head, CT chest abdomen pelvis as below, no overt infectious process ongoing, small 2.7 x 2.6 cm hypodensity of the gastric fundus, follow screening images per protocol with PCP in the next 3 to 6 months.  Hypertension: Systolic currently in the 140s. -Continue home medications  Hyperlipidemia -Continue home medications  BPH, with lower urinary tract syndrome -Patient reports stopping his home Flomax, when asked why he reports "because I wanted to" -Resume Flomax, patient required in and out catheter this morning with greater than 500 cc of urine   DVT prophylaxis: Lovenox Code Status: Full code Family Communication:  Updated daughter over the phone  Status is: Inpatient  Dispo: The patient is from: Home              Anticipated d/c is to: Likely home              Anticipated d/c date is: 48 to 72 hours pending clinical course              Patient currently not medically stable for discharge given ongoing need for further evaluation and IV steroids  Consultants:   Neurology, infectious disease  Procedures:   None planned  Antimicrobials:  Rifampin  Subjective: No acute issues or events overnight, patient in much better spirits today, denies nausea, vomiting, diarrhea, constipation, headache, fevers, chills.  Indicates his right lower extremity still feels somewhat weak compared to the left but paresthesias have resolved.  Objective: Vitals:   11/05/19 1835 11/05/19 2040 11/06/19 0048 11/06/19 0608  BP: 139/74  (!) 143/89 (!) 141/75  Pulse: 92  64  67  Resp: 16  18 18   Temp: 98.6 F (37 C)  97.9 F (36.6 C) 98.4 F (36.9 C)  TempSrc: Oral  Oral Oral  SpO2: 95%  95% 94%  Weight:  90.3 kg    Height:  6' (1.829 m)      Intake/Output Summary (Last 24 hours) at 11/06/2019 0802 Last data filed at 11/06/2019 0609 Gross per 24 hour  Intake 240 ml  Output 1450 ml  Net -1210 ml   Filed Weights   11/05/19 2040  Weight: 90.3 kg    Examination:  General: Resting comfortably in bed, No acute distress. HEENT:  Normocephalic atraumatic.  Sclerae nonicteric, noninjected.  Extraocular movements intact bilaterally. Neck:  Without mass or deformity.  Trachea is midline. Lungs:  Clear to auscultate bilaterally without rhonchi, wheeze, or rales. Heart:  Regular rate and rhythm.  Without murmurs, rubs, or gallops. Abdomen:  Soft, nontender, nondistended.  Without guarding or rebound. Extremities: Without cyanosis, clubbing, edema, or obvious deformity.  No overt weakness on physical exam, bilateral lower extremities and upper extremities 5 out of 5 strength. Vascular:  Dorsalis pedis and posterior tibial pulses palpable bilaterally. Skin:  Warm and dry, no erythema, no ulcerations.  Data Reviewed: I have personally reviewed following labs and imaging studies  CBC: Recent Labs  Lab 11/04/19 1436  WBC 7.1  NEUTROABS 4.0  HGB 16.6  HCT 50.9  MCV 88.8  PLT 527   Basic Metabolic Panel: Recent Labs  Lab 11/04/19 1436  NA 141  K 4.9  CL 104  CO2 27  GLUCOSE 89  BUN 15  CREATININE 1.14  CALCIUM 9.8   GFR: Estimated Creatinine Clearance: 68.1 mL/min (by C-G formula based on SCr of 1.14 mg/dL). Liver Function Tests: No results for input(s): AST, ALT, ALKPHOS, BILITOT, PROT, ALBUMIN in the last 168 hours. No results for input(s): LIPASE, AMYLASE in the last 168 hours. No results for input(s): AMMONIA in the last 168 hours. Coagulation Profile: No results for input(s): INR, PROTIME in the last 168 hours. Cardiac Enzymes: No  results for input(s): CKTOTAL, CKMB, CKMBINDEX, TROPONINI in the last 168 hours. BNP (last 3 results) No results for input(s): PROBNP in the last 8760 hours. HbA1C: No results for input(s): HGBA1C in the last 72 hours. CBG: No results for input(s): GLUCAP in the last 168 hours. Lipid Profile: No results for input(s): CHOL, HDL, LDLCALC, TRIG, CHOLHDL, LDLDIRECT in the last 72 hours. Thyroid Function Tests: No results for input(s): TSH, T4TOTAL, FREET4, T3FREE, THYROIDAB in the last 72 hours. Anemia Panel: Recent Labs    11/04/19 2234  VITAMINB12 357   Sepsis Labs: No results for input(s): PROCALCITON, LATICACIDVEN in the last 168 hours.  Recent Results (from the past 240 hour(s))  Respiratory Panel by RT PCR (Flu A&B, Covid) - Vein     Status: None   Collection Time: 11/04/19 11:55 PM   Specimen: Vein; Nasopharyngeal  Result Value Ref Range Status   SARS Coronavirus 2 by RT PCR NEGATIVE NEGATIVE Final    Comment: (NOTE) SARS-CoV-2 target nucleic acids are NOT DETECTED.  The SARS-CoV-2 RNA is generally detectable in upper respiratoy specimens during the acute phase of infection. The lowest concentration of SARS-CoV-2 viral copies this assay can detect is 131 copies/mL. A negative result does not preclude SARS-Cov-2 infection and should not be used as the sole basis for treatment or other patient management decisions. A negative result may occur with  improper specimen collection/handling, submission of specimen other than nasopharyngeal swab, presence of viral mutation(s) within the areas targeted by this assay, and inadequate number of viral copies (<131 copies/mL). A negative result must be combined with clinical observations, patient history, and epidemiological information. The expected result is Negative.  Fact Sheet for Patients:  PinkCheek.be  Fact Sheet for Healthcare Providers:  GravelBags.it  This test  is no t yet approved or cleared by the [place] FDA and  has been authorized for detection and/or diagnosis of SARS-CoV-2 by FDA under an Emergency Use Authorization (EUA). This EUA will remain  in effect (meaning this test can be used) for the duration of the COVID-19 declaration under Section 564(b)(1) of the Act, 21 U.S.C. section 360bbb-3(b)(1), unless the authorization is terminated or revoked sooner.     Influenza A by PCR NEGATIVE NEGATIVE Final   Influenza B by PCR NEGATIVE NEGATIVE Final    Comment: (NOTE) The Xpert Xpress SARS-CoV-2/FLU/RSV assay is intended as an aid in  the diagnosis of influenza from Nasopharyngeal swab specimens and  should not be used as a sole basis for treatment. Nasal washings and  aspirates are unacceptable for Xpert Xpress SARS-CoV-2/FLU/RSV  testing.  Fact Sheet for Patients: PinkCheek.be  Fact Sheet for Healthcare Providers: GravelBags.it  This test is not yet approved or cleared by the [place] FDA and  has been authorized for detection and/or diagnosis of SARS-CoV-2 by  FDA under an Emergency  Use Authorization (EUA). This EUA will remain  in effect (meaning this test can be used) for the duration of the  Covid-19 declaration under Section 564(b)(1) of the Act, 21  U.S.C. section 360bbb-3(b)(1), unless the authorization is  terminated or revoked. Performed at [facility], [place] [address] [place], [place] [address]          Radiology Studies: CT Head Wo Contrast  Result Date: 11/04/2019 CLINICAL DATA:  Right leg weakness x2 weeks EXAM: CT HEAD WITHOUT CONTRAST TECHNIQUE: Contiguous axial images were obtained from the base of the skull through the vertex without intravenous contrast. COMPARISON:  07/21/2019 FINDINGS: Brain: More conspicuous hypoattenuation in the right frontal white matter, right periventricular white matter, and a small focus in posterior left  frontal white matter suggesting subacute evolving infarcts. No acute hemorrhage, midline shift, mass, mass effect, or hydrocephalus. Vascular: No hyperdense vessel or unexpected calcification. Skull: Normal. Negative for fracture or focal lesion. Sinuses/Orbits: No acute finding. Other: None IMPRESSION: Progressive cerebral white matter changes as above. No hemorrhage or other acute finding. Electronically Signed   By: Lucrezia Europe M.D.   On: 11/04/2019 14:26   MR ANGIO HEAD WO CONTRAST  Result Date: 11/05/2019 CLINICAL DATA:  Right leg weakness and right hand numbness EXAM: MRA HEAD WITHOUT CONTRAST TECHNIQUE: Angiographic images of the Circle of Willis were obtained using MRA technique without intravenous contrast. COMPARISON:  None. FINDINGS: POSTERIOR CIRCULATION: --Vertebral arteries: Normal V4 segments. --Inferior cerebellar arteries: Normal. --Basilar artery: Normal. --Superior cerebellar arteries: Normal. --Posterior cerebral arteries: Normal. The right PCA is predominantly supplied by the posterior communicating artery. ANTERIOR CIRCULATION: --Intracranial internal carotid arteries: Normal. --Anterior cerebral arteries (ACA): Normal. Both A1 segments are present. Patent anterior communicating artery (a-comm). --Middle cerebral arteries (MCA): Normal. 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 Electronically Signed   By: Ulyses Jarred M.D.   On: 11/05/2019 00:33   MR Brain W and Wo Contrast  Result Date: 11/04/2019 CLINICAL DATA:  Myelopathy. Acute or progressive weakness. Concern for multiple sclerosis. EXAM: MRI HEAD WITHOUT AND WITH CONTRAST TECHNIQUE: Multiplanar, multiecho pulse sequences of the brain and surrounding structures were obtained without and with intravenous contrast. CONTRAST:  68mL GADAVIST GADOBUTROL 1 MMOL/ML IV SOLN COMPARISON:  None. FINDINGS: Brain: No acute infarct. There are multiple round/ovoid T2/FLAIR hyperintense lesions in bilateral  frontoparietal, right temporal, and left basal ganglia white matter. Most of these lesions are periventricular in location and many of the lesions are along the callososeptal interface. One of the larger lesions along the right periventricular white matter demonstrates restricted diffusion and irregular peripheral enhancement. An additional smaller lesion in the left corona radiata demonstrates peripheral restricted diffusion and mild peripheral enhancement. A lesion in the right frontal periventricular white matter extends to the subcortical white matter. There are small lesions within the right middle cerebellar peduncle (for example see series 6 and 5, image 8). No substantial mass effect associated with these lesions. No midline shift. No acute hemorrhage. No hydrocephalus. Vascular: Flow voids are maintained at the skull base. Skull and upper cervical spine: Characterized on concurrent MRI of the cervical spine Sinuses/Orbits: Retention versus cyst versus polyp within the right frontal sinus. Scattered ethmoid air cell opacification. Left maxillary sinus and right sphenoid sinus retention cysts. No air-fluid levels. Other: No mastoid effusions IMPRESSION: There are numerous T2/FLAIR hyperintense infratentorial and supratentorial lesions, which are detailed above and compatible with demyelinating disease. A few of the periventricular lesions restrict diffusion and peripherally enhance, compatible with  active demyelination. Electronically Signed   By: Margaretha Sheffield MD   On: 11/04/2019 16:56   MR Cervical Spine W or Wo Contrast  Result Date: 11/04/2019 CLINICAL DATA:  Myelopathy, acute or progressive. Concern for multiple sclerosis. EXAM: MRI CERVICAL SPINE WITHOUT AND WITH CONTRAST TECHNIQUE: Multiplanar and multiecho pulse sequences of the cervical spine, to include the craniocervical junction and cervicothoracic junction, were obtained without and with intravenous contrast. CONTRAST:  11mL GADAVIST  GADOBUTROL 1 MMOL/ML IV SOLN COMPARISON:  None. FINDINGS: Alignment: Physiologic. Vertebrae: Vertebral body heights are maintained. No focal marrow edema to suggest acute fracture, osteomyelitis, or abnormal bone lesion. Edema associated with right C5-C6 facet arthropathy. Cord: There is short segment, focal, non expansile wT2 hyperintensity within the lateral and dorsal cord at C2-C3 and C3-C4 (see series 14, image 13 and 14) Posterior Fossa, vertebral arteries, paraspinal tissues: No acute abnormality. Disc levels: C2-C3: No significant disc protrusion, foraminal stenosis, or canal stenosis. C3-C4: Small posterior disc osteophyte complex and left greater than right facet and uncovertebral hypertrophy. Mild left foraminal stenosis. No significant canal stenosis. C4-C5: Small posterior disc osteophyte complex and bilateral facet and uncovertebral hypertrophy. Mild canal stenosis and mild bilateral foraminal stenosis. C5-C6: Small posterior disc osteophyte complex and right greater than left facet and uncovertebral hypertrophy. Moderate right and mild left foraminal stenosis. C6-C7: Right greater than left facet and uncovertebral hypertrophy with mild bilateral foraminal stenosis. No significant canal stenosis. C7-T1: No significant canal or foraminal stenosis. Visualized upper thoracic spine: Incompletely characterized without evidence of significant canal or foraminal stenosis. IMPRESSION: 1. Focal short-segment T2 hyperintensity within the dorsolateral right cord at C2-C3 and C3-C4, which is compatible with demyelinating disease given findings on concurrent MRI head. No clear enhancement in these regions to suggest active demyelination. 2. 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 Electronically Signed   By: Margaretha Sheffield MD   On: 11/04/2019 17:07   CT CHEST ABDOMEN PELVIS W CONTRAST  Result Date: 11/05/2019 CLINICAL DATA:   Demyelinating disorder suspected, malignancy search EXAM: CT CHEST, ABDOMEN, AND PELVIS WITH CONTRAST TECHNIQUE: Multidetector CT imaging of the chest, abdomen and pelvis was performed following the standard protocol during bolus administration of intravenous contrast. CONTRAST:  169mL OMNIPAQUE IOHEXOL 300 MG/ML SOLN, additional oral enteric contrast COMPARISON:  None. FINDINGS: CT CHEST FINDINGS Cardiovascular: No significant vascular findings. Normal heart size. No pericardial effusion. Mediastinum/Nodes: No enlarged mediastinal, hilar, or axillary lymph nodes. Thyroid gland, trachea, and esophagus demonstrate no significant findings. Lungs/Pleura: Lungs are clear. No pleural effusion or pneumothorax. Musculoskeletal: No chest wall mass or suspicious bone lesions identified. CT ABDOMEN PELVIS FINDINGS Hepatobiliary: No solid liver abnormality is seen. No gallstones, gallbladder wall thickening, or biliary dilatation. Pancreas: Unremarkable. No pancreatic ductal dilatation or surrounding inflammatory changes. Spleen: Normal in size without significant abnormality. Adrenals/Urinary Tract: Adrenal glands are unremarkable. There is a large exophytic cyst of the superior pole of the right kidney measuring 12.8 cm. Kidneys are otherwise normal, without renal calculi, solid lesion, or hydronephrosis. Mild thickening of the urinary bladder. Stomach/Bowel: There is a submucosal hypodensity of the gastric fundus measuring approximately 2.7 x 2.6 cm (series 3, image 50). Appendix appears normal. No evidence of bowel wall thickening, distention, or inflammatory changes. Generally large burden of stool throughout the colon and rectum. Vascular/Lymphatic: Scattered aortic atherosclerosis. No enlarged abdominal or pelvic lymph nodes. Reproductive: Mild prostatomegaly. Urolift implants in the prostate. Other: No abdominal wall hernia or abnormality.  No abdominopelvic ascites. Musculoskeletal: No acute or significant osseous  findings. IMPRESSION: 1. No definite evidence of mass or lymphadenopathy in the chest, abdomen, or pelvis. 2. There is a submucosal hypodensity of the gastric fundus measuring approximately 2.7 x 2.6 cm, of uncertain nature. Gastric malignancy difficult to exclude. Consider endoscopy to further evaluate. 3. Large benign cyst of the superior pole of the right kidney measuring approximately 12.8 cm, which may be symptomatic due to size. 4. Mild prostatomegaly. Mild thickening of the urinary bladder, likely due to chronic outlet obstruction. 5. Aortic Atherosclerosis (ICD10-I70.0). Electronically Signed   By: Eddie Candle M.D.   On: 11/05/2019 12:26   Scheduled Meds: . aspirin  325 mg Oral QHS  . atorvastatin  20 mg Oral QHS  . brimonidine  1 drop Both Eyes TID  . Chlorhexidine Gluconate Cloth  6 each Topical Daily  . dorzolamide-timolol  1 drop Both Eyes BID  . enoxaparin (LOVENOX) injection  40 mg Subcutaneous Q24H  . lisinopril  10 mg Oral Daily  . metoprolol succinate  25 mg Oral Daily  . Netarsudil Dimesylate  1 drop Both Eyes QHS  . tamsulosin  0.8 mg Oral QHS   Continuous Infusions:    LOS: 2 days   Time spent: 13min  Leni Pankonin C Layton Naves, DO [facility]  If 7PM-7AM, please contact night-coverage [email or web address]  11/06/2019, 8:02 AM

## 2019-11-06 NOTE — Progress Notes (Addendum)
NEUROLOGY PROGRESS NOTE  Subjective: Currently still feels weak on the right side.  No other complaints.  States he does have a primary neurologist to follow-up with at [facility].  Exam: Vitals:   11/06/19 0949 11/06/19 0950  BP: (!) 159/83 (!) 159/83  Pulse: 71   Resp:    Temp:    SpO2: 100%     Neuro:  Mental Status: Alert, oriented, thought content appropriate.  Speech fluent without evidence of aphasia.  Able to follow 3 step commands without difficulty. Cranial Nerves: II:  Visual fields grossly normal,  III,IV, VI: ptosis not present, extra-ocular motions intact bilaterally pupils equal, round, reactive to light and accommodation V,VII: smile symmetric, facial light touch sensation normal bilaterally XII: midline tongue extension Motor: Right : Upper extremity   5/5    Left:     Upper extremity   5/5  Lower extremity   5/5     Lower extremity   5/5 Tone and bulk:normal tone throughout; no atrophy noted Sensory: Pinprick and light touch intact throughout, bilaterally Deep Tendon Reflexes: 2+ and symmetric throughout   Medications:  Scheduled: . aspirin  325 mg Oral QHS  . atorvastatin  20 mg Oral QHS  . brimonidine  1 drop Both Eyes TID  . Chlorhexidine Gluconate Cloth  6 each Topical Daily  . dorzolamide-timolol  1 drop Both Eyes BID  . enoxaparin (LOVENOX) injection  40 mg Subcutaneous Q24H  . lisinopril  10 mg Oral Daily  . metoprolol succinate  25 mg Oral Daily  . Netarsudil Dimesylate  1 drop Both Eyes QHS  . pantoprazole (PROTONIX) IV  40 mg Intravenous Q24H  . tamsulosin  0.8 mg Oral QHS    Pertinent Labs/Diagnostics: MR Brain W and Wo Contrast: There are numerous T2/FLAIR hyperintense infratentorial and supratentorial lesions, which are detailed above and compatible with demyelinating disease. A few of the periventricular lesions restrict diffusion and peripherally enhance, compatible with active demyelination.  MR Cervical Spine W or Wo Contrast 1.  Focal short-segment T2 hyperintensity within the dorsolateral right cord at C2-C3 and C3-C4, which is compatible with demyelinating disease given findings on concurrent MRI head. No clear enhancement in these regions to suggest active demyelination. 2. 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   Etta Quill PA-C [facility] [phone]  Assessment: 69 y.o. 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 His neurologic examination is notable for Left hand grip weakness and Left hip flexion weakness.  1. 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 MRI C spine reveals cord lesions with morphologies and locations suggestive of MS.  2. However, there are several atypical features of his overall presentation. He is an above 74 male and the incidence of new onset MS is very low in this population. He has elevated CSF protein which can be seen on MS relapses but it is not typically this high and there is slight asymmetric smooth enhancement of the left facial nerve which is also atypical for multiple sclerosis. The enhancement in some of the lesions is heterogenous, which is possible in MS, especially with subacute lesions. 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  He was treated in the past with amoxicillin.  They do not feel that he needs any further antibiotics at this time.  The question of methylprednisolone was brought up and they agree with administering methylprednisolone. 3. A vasculitis is on the DDx, but the  locations, morphologies and signal characteristics of the lesions would be quite atypical for this.  4. A slowly progressive cerebral infection such as can be seen with Lyme disease, ehrlichiosis or RMSF has been considered.  However, the imaging findings would be atypical and ID also does not feel that his presentation is due to infection (see #2 above).  5. The lesion distribution and discrete morphologies would be highly atypical for a paraneoplastic process.   Recommendations: 1.  Start IV pulsed dose steroids. Order for methylprednisolone 1000 mg daily for 5 days has been placed. Monitor CBG, CBC and chem7 while on IV steroids.  We will also administer Protonix 40 mg daily for gut protection. 2.  Will need outpatient follow-up with his  primary Neurologist.   Electronically signed: Dr. Kerney Elbe 11/06/2019, 10:34 AM

## 2019-11-06 NOTE — Evaluation (Signed)
Physical Therapy Evaluation Patient Details Name: Carnelius Hammitt MRN: [id number] DOB: 1951/01/11 Today's Date: 11/06/2019   History of Present Illness  Tyrone Pautsch is a 69 y.o. 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 No clear enhancement in these regions to suggest active demyelination. 2. 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  Clinical Impression  Patient presents with decreased mobility due to weakness and decreased coordination in LE's, decreased coordination in UE's and decreased balance.  He functioned at home with a cane until recently, then needing RW again.  Has had previous therapies following other episodes and was able to progress to a cane.  Feel he will benefit from skilled PT in the acute setting and follow up HHPT at d/c.      Follow Up Recommendations Home health PT;Supervision/Assistance - 24 hour    Equipment Recommendations  Wheelchair cushion (measurements PT);Wheelchair (measurements PT)    Recommendations for Other Services       Precautions / Restrictions Precautions Precautions: Fall      Mobility  Bed Mobility Overal bed mobility: Needs Assistance Bed Mobility: Supine to Sit     Supine to sit: HOB elevated;Min assist     General bed mobility comments: increased time, use of rail and struggling to lift trunk and scoot to EOB so provided assistance for eah  Transfers Overall transfer level: Needs assistance Equipment used: Rolling walker (2 wheeled) Transfers: Sit to/from Stand Sit to Stand: From elevated surface;Min assist;Mod assist         General transfer comment: pulls up on RW,  assist to keep walker steady and for balance, some lifting  Ambulation/Gait Ambulation/Gait assistance: Min assist Gait Distance (Feet): 20 Feet (x 2) Assistive device: Rolling walker (2 wheeled) Gait Pattern/deviations: Step-through pattern;Decreased stride length;Shuffle;Trunk flexed;Wide base of support     General Gait Details: slow pace, lifting feet up for clearance, but no heel strike or toe off, increased time for all movement and reports legs feel heavy  Stairs            Wheelchair Mobility    Modified Rankin (Stroke Patients Only)       Balance Overall balance assessment: Needs assistance Sitting-balance support: Feet supported;Bilateral upper extremity supported Sitting balance-Leahy Scale: Poor Sitting balance - Comments: able to stabilize with UE support initially, but fatigues falling back and to R Postural control: Posterior lean;Right lateral lean Standing balance support: Bilateral upper extremity supported Standing balance-Leahy Scale: Poor Standing balance comment: UE support for balance, asisst for hygiene in standing due to unable to stabilize with 1 UE support                             Pertinent Vitals/Pain Pain Assessment: No/denies pain    Home Living Family/patient expects to be discharged to:: Private residence Living Arrangements: Spouse/significant other Available Help at Discharge: Family;Available PRN/intermittently Type of Home: House Home Access: Stairs to enter Entrance Stairs-Rails: Right Entrance Stairs-Number of Steps: 2 Home Layout: One level Home Equipment: Walker - 2 wheels;Shower seat;Bedside commode      Prior Function Level of Independence: Independent with assistive device(s)  Comments: had progressed to a cane, but last couple weeks needing walker again; no falls at home, one at [facility] in bathroom, but no injury; wife does IADL's, but pt still drives.     Hand Dominance         Extremity/Trunk Assessment   Upper Extremity Assessment Upper Extremity Assessment: RUE deficits/detail;LUE deficits/detail RUE Deficits / Details: AROM and strength grossly WFL, but some decreased coordination with some muscle impersistence, difficulty grading, etc RUE Sensation: decreased light touch (thumb and index area) RUE Coordination: decreased gross motor;decreased fine motor LUE Deficits / Details: AROM and strength grossly WFL, but some decreased coordination with some muscle impersistence, difficulty grading, etc LUE Sensation: decreased light touch (lateral aspect of pinky and hypothenar area) LUE Coordination: decreased gross motor;decreased fine motor    Lower Extremity Assessment Lower Extremity Assessment: RLE deficits/detail;LLE deficits/detail RLE Deficits / Details: AROM WFL, strength hip flexion 4-/5, knee extension 4+/5, flexion 4-/5, ankle DF 4+/5 RLE Coordination: decreased gross motor (decreased with toe taps bilatera) LLE Deficits / Details: AROM WFL, strength hip flexion 4-/5, knee extension 4+/5, flexion 4-/5, ankle DF 4+/5 LLE Coordination: decreased gross motor (decreased with toe taps bilateral)    Cervical / Trunk Assessment Cervical / Trunk Assessment: Kyphotic  Communication   Communication: No difficulties  Cognition Arousal/Alertness: Awake/alert Behavior During Therapy: WFL for tasks assessed/performed Overall Cognitive Status: Within Functional Limits for tasks assessed                                        General Comments General comments (skin integrity, edema, etc.): Daughter arrived during session and brought him a seat cushoin; reports he may stay with daughter(s) here in [place] before returning to [place] as his wife works.  PAtient well known to agency in [place], but will have to choose agency here.    Exercises     Assessment/Plan    PT Assessment Patient needs continued PT services  PT Problem List Decreased  mobility;Decreased safety awareness;Decreased balance;Decreased coordination;Decreased knowledge of use of DME;Decreased activity tolerance       PT Treatment Interventions DME instruction;Therapeutic activities;Patient/family education;Gait training;Stair training;Balance training;Functional mobility training    PT Goals (Current goals can be found in the Care Plan section)  Acute Rehab PT Goals Patient Stated Goal: to go home and do therapy there PT Goal Formulation: With patient/family Time For Goal Achievement: 11/20/19 Potential to Achieve Goals: Good    Frequency Min 3X/week   Barriers to discharge        Co-evaluation               AM-PAC PT "6 Clicks" Mobility  Outcome Measure Help needed turning from your back to your side while in a flat bed without using bedrails?: A Little Help needed moving from lying on your back to sitting on the side of a flat bed without using bedrails?: A Little Help needed moving to and from a bed to a chair (including a wheelchair)?: A Little Help needed standing up from a chair using your arms (e.g., wheelchair or bedside chair)?: A Lot Help needed to walk in hospital room?: A Little Help needed climbing 3-5 steps with a railing? : A Lot 6 Click Score: 16    End of Session   Activity Tolerance: Patient tolerated treatment well Patient left: in chair;with call bell/phone within reach;with family/visitor present   PT Visit Diagnosis: Other abnormalities  of gait and mobility (R26.89);Difficulty in walking, not elsewhere classified (R26.2);Other symptoms and signs involving the nervous system (R29.898)    Time: 5848-3507 PT Time Calculation (min) (ACUTE ONLY): 37 min   Charges:   PT Evaluation $PT Eval Moderate Complexity: 1 Mod PT Treatments $Gait Training: 8-22 mins        Magda Kiel, PT Acute Rehabilitation Services Pager:[phone] [phone] 11/06/2019   Reginia Naas 11/06/2019, 5:23 PM

## 2019-11-06 NOTE — Progress Notes (Signed)
    [place] for Infectious Disease   Reason for visit: Follow up on demylinating disease   Interval History: no acute events; called by neurology to review the case in regards to tick-related infection as cause.  He has a progressive neurologic disorder with difficulty of his gait in June 2021 and has been seen at [facility], [facility] with significant MRI findings.  He was seen in an urgent care and has Erlichia and RMSF serology done and minimally possitive IgG.  I do not see an IgM.  He was treated with amoxicillin.   He has been seen by ID at [facility] and recommended further neurology work up and the findings and labs not c/w tick-related illness.  He was seen by my partner yesterday and did not feel antibiotics are indicated and not related to tick-borne illness.  He is being followed by neurology at [facility] and Lyme in the CSF was negative as well.    Physical Exam: Constitutional:  Vitals:   11/06/19 0048 11/06/19 0608  BP: (!) 143/89 (!) 141/75  Pulse: 64 67  Resp: 18 18  Temp: 97.9 F (36.6 C) 98.4 F (36.9 C)  SpO2: 95% 94%   patient appears in NAD  Impression: I agree with previous findings, no indication for antibiotics or concern for tick-related pathology.    Plan: 1.  No changes  I will sign off, thanks

## 2019-11-07 LAB — CBC
HCT: 48.1 % (ref 39.0–52.0)
Hemoglobin: 15.9 g/dL (ref 13.0–17.0)
MCH: 28.8 pg (ref 26.0–34.0)
MCHC: 33.1 g/dL (ref 30.0–36.0)
MCV: 87 fL (ref 80.0–100.0)
Platelets: 248 10*3/uL (ref 150–400)
RBC: 5.53 MIL/uL (ref 4.22–5.81)
RDW: 12 % (ref 11.5–15.5)
WBC: 12.1 10*3/uL — ABNORMAL HIGH (ref 4.0–10.5)
nRBC: 0 % (ref 0.0–0.2)

## 2019-11-07 LAB — COMPREHENSIVE METABOLIC PANEL
ALT: 15 U/L (ref 0–44)
AST: 18 U/L (ref 15–41)
Albumin: 3.6 g/dL (ref 3.5–5.0)
Alkaline Phosphatase: 48 U/L (ref 38–126)
Anion gap: 11 (ref 5–15)
BUN: 15 mg/dL (ref 8–23)
CO2: 24 mmol/L (ref 22–32)
Calcium: 9.4 mg/dL (ref 8.9–10.3)
Chloride: 104 mmol/L (ref 98–111)
Creatinine, Ser: 1.04 mg/dL (ref 0.61–1.24)
GFR calc Af Amer: >60 mL/min (ref >60)
GFR calc non Af Amer: >60 mL/min (ref >60)
Glucose, Bld: 136 mg/dL — ABNORMAL HIGH (ref 70–99)
Potassium: 3.7 mmol/L (ref 3.5–5.1)
Sodium: 139 mmol/L (ref 135–145)
Total Bilirubin: 0.9 mg/dL (ref 0.3–1.2)
Total Protein: 6.5 g/dL (ref 6.5–8.1)

## 2019-11-07 NOTE — Progress Notes (Signed)
Physical Therapy Treatment Patient Details Name: Bryan Robertson MRN: [id number] DOB: 06-Aug-1950 Today's Date: 11/07/2019    History of Present Illness Bryan Robertson is a 69 y.o. 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 No clear enhancement in these regions to suggest active demyelination. 2. 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    PT Comments    Pt reports increased fatigue today and requires increased assist for safe mobility. Pt is currently limited in safe mobility by decreased balance, strength and endurance. Pt requires min A for bed mobility, modAx2 for transfers and min A for ambulation of 5 feet with RW. Have asked CW to confirm level of assist available at home. Pt reports his wife works during the day. Pt will require assist for safety with transfers and will need 24 hour physical assist. PT will continue to follow acutely.   Follow Up Recommendations  Home health PT;Supervision/Assistance - 24 hour (will require 24 hour physical assist for safety with mobilit)     Equipment Recommendations  Wheelchair cushion (measurements PT);Wheelchair (measurements PT)       Precautions / Restrictions Precautions Precautions: Fall Restrictions Weight Bearing Restrictions: No    Mobility  Bed Mobility Overal bed mobility: Needs Assistance Bed Mobility: Supine to Sit     Supine to sit: HOB elevated;Min assist     General bed mobility comments: min Ax2 for posterior support as he falls backward and to R easily with UE elevated   Transfers Overall transfer level: Needs assistance Equipment used: Rolling walker (2  wheeled) Transfers: Sit to/from Stand Sit to Stand: Mod assist;Min assist         General transfer comment: modAx2 for initial power up to RW, min A for subsequent sit<>stand for pericare, modA for descent into recliner due to decreased eccentric control   Ambulation/Gait Ambulation/Gait assistance: Min assist Gait Distance (Feet): 5 Feet Assistive device: None Gait Pattern/deviations: Decreased stride length;Shuffle;Trunk flexed;Wide base of support;Step-through pattern Gait velocity: slowed   General Gait Details: minA for steadying with ambulation to chair        Balance Overall balance assessment: Needs assistance Sitting-balance support: Feet supported;Bilateral upper extremity supported Sitting balance-Leahy Scale: Poor Sitting balance - Comments: able to stabilize with UE support initially, but fatigues falling back and to R Postural control: Posterior lean;Right lateral lean Standing balance support: Bilateral upper extremity supported Standing balance-Leahy Scale: Poor Standing balance comment: UE support for balance, asisst for hygiene in standing due to unable to stabilize with 1 UE support                            Cognition Arousal/Alertness: Awake/alert Behavior During Therapy: WFL for tasks assessed/performed Overall Cognitive Status: Impaired/Different from baseline Area of Impairment: Awareness;Problem solving                           Awareness: Emergent Problem Solving: Slow processing;Difficulty sequencing;Requires verbal cues;Requires tactile cues General Comments: pt requires increased cuing for anterior lean to keep from falling backward in sitting, and is unaware he is incontinent of stool  General Comments General comments (skin integrity, edema, etc.): VSS on RA       Pertinent Vitals/Pain Pain Assessment: No/denies pain    Home Living Family/patient expects to be discharged to:: Private residence Living  Arrangements: Spouse/significant other Available Help at Discharge: Family;Available PRN/intermittently Type of Home: House Home Access: Stairs to enter Entrance Stairs-Rails: Right Home Layout: One level Home Equipment: Walker - 2 wheels;Shower seat;Bedside commode;Grab bars - tub/shower;Grab bars - toilet;Cane - quad      Prior Function Level of Independence: Independent with assistive device(s)      Comments: had progressed to a cane, but last couple weeks needing walker again; no falls at home, one at [facility] in bathroom, but no injury; wife does IADL's, but pt still drives.   PT Goals (current goals can now be found in the care plan section) Acute Rehab PT Goals Patient Stated Goal: to go home and do therapy there PT Goal Formulation: With patient/family Time For Goal Achievement: 11/20/19 Potential to Achieve Goals: Good Progress towards PT goals: Not progressing toward goals - comment (appears to have increased fatigue today, requires increased )    Frequency    Min 3X/week      PT Plan Current plan remains appropriate    Co-evaluation PT/OT/SLP Co-Evaluation/Treatment: Yes Reason for Co-Treatment: For patient/therapist safety PT goals addressed during session: Mobility/safety with mobility OT goals addressed during session: ADL's and self-care      AM-PAC PT "6 Clicks" Mobility   Outcome Measure  Help needed turning from your back to your side while in a flat bed without using bedrails?: A Little Help needed moving from lying on your back to sitting on the side of a flat bed without using bedrails?: A Lot Help needed moving to and from a bed to a chair (including a wheelchair)?: A Lot Help needed standing up from a chair using your arms (e.g., wheelchair or bedside chair)?: A Lot Help needed to walk in hospital room?: A Little Help needed climbing 3-5 steps with a railing? : A Lot 6 Click Score: 14    End of Session Equipment Utilized During Treatment: Gait  belt Activity Tolerance: Patient tolerated treatment well Patient left: in chair;with call bell/phone within reach;with family/visitor present Nurse Communication: Mobility status PT Visit Diagnosis: Other abnormalities of gait and mobility (R26.89);Difficulty in walking, not elsewhere classified (R26.2);Other symptoms and signs involving the nervous system (W54.627)     Time: 0350-0938 PT Time Calculation (min) (ACUTE ONLY): 10 min  Charges:  $Therapeutic Activity: 8-22 mins                     Shawn Carattini B. Migdalia Dk PT, DPT Acute Rehabilitation Services Pager [phone] Office [phone]    [place] 11/07/2019, 10:20 AM

## 2019-11-07 NOTE — Care Management (Signed)
    Durable Medical Equipment  (From admission, onward)         Start     Ordered   11/07/19 0829  For home use only DME standard manual wheelchair with seat cushion  Once       Comments: Patient suffers from Acute on chronic ambulatory dysfunction with recurrent falls in the setting of right leg weakness, right hand numbness, which impairs their ability to perform daily activities like ambulating  in the home.  A cane will not resolve issue with performing activities of daily living. A wheelchair will allow patient to safely perform daily activities. Patient can safely propel the wheelchair in the home or has a caregiver who can provide assistance. Length of need lifetime . Accessories: elevating leg rests (ELRs), wheel locks, extensions and anti-tippers.  Seat and back cushion   11/07/19 0829

## 2019-11-07 NOTE — TOC Initial Note (Addendum)
Transition of Care [facility]) - Initial/Assessment Note    Patient Details  Name: Bryan Robertson MRN: [id number] Date of Birth: [birth year]  Transition of Care [facility]) CM/SW Contact:    Marilu Favre, RN Phone Number: 11/07/2019, 10:36 AM  Clinical Narrative:                 Confirmed face sheet information with patient. Patient from home with wife.   Discussed PT/OT recommendations for HHPT/OT with 24 hour supervision. Patient states he his wife works part time, and he has support from other family members while she is working. Or he may stay in [place] with his daughter at discharge.  NCM asked to call wife and / or daughter patient declined stated he will call and asked NCM to return shortly and he will provide address he will be going to at discharge.   NCM provided medicare.gov list of home health agencies. Patient has had [facility] in past and would like them again.   Once address confirmed will make referral.   Chelsea with [facility] checking on wheel chair. [facility] will deliver wheel chair to patient's room today or tomorrow   1058 Update. Patient has spoken to his daughter and he will discharge to her home. Daughters address is [address], [place], [place] [address].  Malachy Mood with [facility] accepted referral.  1 Chelsea with [facility] called back , they are unable to supply wheelchair due to when patient returns to [facility] if there are any issues with the wheel chair the closest [place] office is a hour away.   Called [place] at [facility] they currently do not have any wheel chairs in [place].  Left voicemail with Melene Muller with [facility], awaiting a call back. Jermain with [employer] returned call and will check to see if he has the correct size wheel chair. Awaiting call back.   Jermaine with [employer] will provide wheel chair.  Expected Discharge Plan: [place] Barriers to Discharge: Continued Medical Work up   Patient Goals and CMS  Choice Patient states their goals for this hospitalization and ongoing recovery are:: to return to home CMS Medicare.gov Compare Post Acute Care list provided to:: Patient Choice offered to / list presented to : Patient  Expected Discharge Plan and Services Expected Discharge Plan: [place]   Discharge Planning Services: CM Consult Post Acute Care Choice: Home Health, Durable Medical Equipment Living arrangements for the past 2 months: Single Family Home                 DME Arranged: Wheelchair manual DME Agency: [facility] Date DME Agency Contacted: 11/07/19 Time DME Agency Contacted: 0800 Representative spoke with at DME Agency: [place] Arranged: PT, OT          Prior Living Arrangements/Services Living arrangements for the past 2 months: [place] Lives with:: Spouse Patient language and need for interpreter reviewed:: Yes Do you feel safe going back to the place where you live?: Yes      Need for Family Participation in Patient Care: Yes (Comment) Care giver support system in place?: Yes (comment)   Criminal Activity/Legal Involvement Pertinent to Current Situation/Hospitalization: No - Comment as needed  Activities of Daily Living Home Assistive Devices/Equipment: Cane (specify quad or straight) ADL Screening (condition at time of admission) Patient's cognitive ability adequate to safely complete daily activities?: No Is the patient deaf or have difficulty hearing?: No Does the patient have difficulty seeing, even when wearing glasses/contacts?: No  Does the patient have difficulty concentrating, remembering, or making decisions?: No Patient able to express need for assistance with ADLs?: Yes Does the patient have difficulty dressing or bathing?: Yes Independently performs ADLs?: Yes (appropriate for developmental age) Does the patient have difficulty walking or climbing stairs?: No Weakness of Legs: Both Weakness of Arms/Hands:  None  Permission Sought/Granted   Permission granted to share information with : No              Emotional Assessment Appearance:: Appears stated age Attitude/Demeanor/Rapport: Engaged Affect (typically observed): Accepting Orientation: : Oriented to Self, Oriented to Place, Oriented to  Time, Oriented to Situation Alcohol / Substance Use: Not Applicable Psych Involvement: No (comment)  Admission diagnosis:  Weakness [R53.1] Neurological deficit present [R29.818] 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 Right leg weakness 11/05/2019  . Essential hypertension 11/05/2019  . Hyperlipidemia 11/05/2019  . BPH (benign prostatic hyperplasia) 11/05/2019   PCP:  Assunta Curtis, FNP Pharmacy:   [id number] [place] [address] [place] [address] Phone: [phone] Fax: [phone]  [id number] [place] [place] [address] Phone: [phone] Fax: [phone]     Social Determinants of Health (SDOH) Interventions    Readmission Risk Interventions No flowsheet data found.

## 2019-11-07 NOTE — Evaluation (Signed)
Occupational Therapy Evaluation Patient Details Name: Bryan Robertson MRN: [id number] DOB: 1950-12-16 Today's Date: 11/07/2019    History of Present Illness Bryan Robertson is a 69 y.o. 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 No clear enhancement in these regions to suggest active demyelination. 2. 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   Clinical Impression   PTA patient independent with ADLs, mobility using RW and driving. Admitted for above and limited by problem list below, including impaired balance, L sided weakness, decreased activity tolerance and impaired cognition.  Patient presenting with decreased safety awareness, awareness to deficits, and problem solving.  He requires min assist for bed mobility, mod assist +2 for initial transfer (fading to min assist for transfer), and min to total assist +2 for ADls.  Pt incontinent of BM EOB, requiring total assist for hygiene and support of 2nd person to maintain balance. Patient with poor sitting balance with posterior and R lateral lean when engaging in ADL tasks.  Based on performance today, patient will benefit from further OT services while admitted and after dc at [facility] level, IF he has 24/7 physical support at home.  Will follow acutely.     Follow Up Recommendations  Home health OT;Supervision/Assistance - 24 hour    Equipment Recommendations  3 in 1 bedside commode    Recommendations for Other Services       Precautions / Restrictions Precautions Precautions: Fall Restrictions Weight Bearing Restrictions: No      Mobility Bed Mobility Overal bed mobility: Needs  Assistance Bed Mobility: Supine to Sit     Supine to sit: HOB elevated;Min assist     General bed mobility comments: min Ax2 for posterior support as he falls backward and to R easily with UE elevated   Transfers Overall transfer level: Needs assistance Equipment used: Rolling walker (2 wheeled) Transfers: Sit to/from Stand Sit to Stand: Mod assist;Min assist         General transfer comment: modAx2 for initial power up to RW, min A for subsequent sit<>stand for pericare, modA for descent into recliner due to decreased eccentric control     Balance Overall balance assessment: Needs assistance Sitting-balance support: Feet supported;Bilateral upper extremity supported Sitting balance-Leahy Scale: Poor Sitting balance - Comments: able to stabilize with UE support initially, but fatigues falling back and to R Postural control: Posterior lean;Right lateral lean Standing balance support: Bilateral upper extremity supported Standing balance-Leahy Scale: Poor Standing balance comment: UE support for balance, asisst for hygiene in standing due to unable to stabilize with 1 UE support                           ADL either performed or assessed with clinical judgement   ADL Overall ADL's : Needs assistance/impaired     Grooming: Minimal assistance;Sitting   Upper Body Bathing: Minimal assistance;Sitting   Lower Body Bathing: Maximal assistance;+2 for physical assistance;+2 for safety/equipment;Sit to/from stand   Upper Body Dressing : Minimal assistance;Sitting   Lower Body Dressing: Total assistance;+2 for physical assistance;+2 for safety/equipment;Sit to/from stand   Toilet Transfer: Moderate assistance;+2 for physical assistance;+2 for safety/equipment;Ambulation;RW Toilet Transfer Details (indicate cue  type and reason): simulated to recliner  Toileting- Clothing Manipulation and Hygiene: Total assistance;+2 for physical assistance;+2 for safety/equipment;Sit  to/from stand Toileting - Clothing Manipulation Details (indicate cue type and reason): incontinent BM EOB     Functional mobility during ADLs: Moderate assistance;+2 for physical assistance;+2 for safety/equipment;Rolling walker;Cueing for sequencing;Cueing for safety General ADL Comments: pt limited by impaired balance, weakness, decreased activity tolerance and cognition     Vision Baseline Vision/History: Wears glasses Wears Glasses: At all times Patient Visual Report: No change from baseline Vision Assessment?: No apparent visual deficits     Perception     Praxis      Pertinent Vitals/Pain Pain Assessment: No/denies pain     Hand Dominance Right   Extremity/Trunk Assessment Upper Extremity Assessment Upper Extremity Assessment: RUE deficits/detail;LUE deficits/detail RUE Deficits / Details: grossly 3+/5 MMT  LUE Deficits / Details: grossly 3-/5 MMT    Lower Extremity Assessment Lower Extremity Assessment: Defer to PT evaluation   Cervical / Trunk Assessment Cervical / Trunk Assessment: Kyphotic   Communication Communication Communication: No difficulties   Cognition Arousal/Alertness: Awake/alert Behavior During Therapy: WFL for tasks assessed/performed Overall Cognitive Status: Impaired/Different from baseline Area of Impairment: Awareness;Problem solving;Safety/judgement                         Safety/Judgement: Decreased awareness of safety;Decreased awareness of deficits Awareness: Emergent Problem Solving: Slow processing;Difficulty sequencing;Requires verbal cues;Requires tactile cues General Comments: patient incontient of bowel and unaware upon exiting bed, decreased problem solving and awareness to deficits    General Comments  VSS on RA     Exercises     Shoulder Instructions      Home Living Family/patient expects to be discharged to:: Private residence Living Arrangements: Spouse/significant other Available Help at Discharge:  Family;Available PRN/intermittently Type of Home: House Home Access: Stairs to enter [employer] of Steps: 2 Entrance Stairs-Rails: Right Home Layout: One level     Bathroom Shower/Tub: Teacher, early years/pre: Standard     Home Equipment: Environmental consultant - 2 wheels;Shower seat;Bedside commode;Grab bars - tub/shower;Grab bars - toilet;Cane - quad          Prior Functioning/Environment Level of Independence: Independent with assistive device(s)        Comments: had progressed to a cane, but last couple weeks needing walker again; no falls at home, one at [facility] in bathroom, but no injury; wife does IADL's, but pt still drives.        OT Problem List: Decreased strength;Decreased activity tolerance;Impaired balance (sitting and/or standing);Pain;Decreased coordination;Decreased cognition;Decreased safety awareness;Decreased knowledge of precautions      OT Treatment/Interventions: Self-care/ADL training;DME and/or AE instruction;Therapeutic exercise;Therapeutic activities;Cognitive remediation/compensation;Balance training;Patient/family education    OT Goals(Current goals can be found in the care plan section) Acute Rehab OT Goals Patient Stated Goal: to go home and do therapy there OT Goal Formulation: With patient Time For Goal Achievement: 11/21/19 Potential to Achieve Goals: Good  OT Frequency: Min 2X/week   Barriers to D/C:            Co-evaluation PT/OT/SLP Co-Evaluation/Treatment: Yes Reason for Co-Treatment: For patient/therapist safety;To address functional/ADL transfers PT goals addressed during session: Mobility/safety with mobility OT goals addressed during session: ADL's and self-care      AM-PAC OT "6 Clicks" Daily Activity     Outcome Measure Help from another person eating meals?: None Help from another person taking care of personal grooming?: A Little Help from another person toileting, which  includes using toliet, bedpan, or  urinal?: Total Help from another person bathing (including washing, rinsing, drying)?: A Lot Help from another person to put on and taking off regular upper body clothing?: A Little Help from another person to put on and taking off regular lower body clothing?: A Lot 6 Click Score: 15   End of Session Equipment Utilized During Treatment: Gait belt;Rolling walker Nurse Communication: Mobility status  Activity Tolerance: Patient tolerated treatment well Patient left: in chair;with call bell/phone within reach;with chair alarm set  OT Visit Diagnosis: Other abnormalities of gait and mobility (R26.89);Muscle weakness (generalized) (M62.81);Other symptoms and signs involving cognitive function                Time: 9842-1031 OT Time Calculation (min): 27 min Charges:  OT General Charges $OT Visit: 1 Visit OT Evaluation $OT Eval Moderate Complexity: 1 Mod  Jolaine Artist, OT Acute Rehabilitation Services Pager [phone] Office [phone]   Bryan Robertson 11/07/2019, 12:33 PM

## 2019-11-07 NOTE — Care Management (Signed)
Ordered wheel chair for home. [facility] checking to see if they have any wheel chairs.   Magdalen Spatz RN

## 2019-11-07 NOTE — Progress Notes (Signed)
PROGRESS NOTE    Bryan Robertson  FTD:[id number] DOB: Jan 25, 1951 DOA: 11/04/2019 PCP: Assunta Curtis, FNP   Brief Narrative:  Bryan Robertson is a fairly unpleasant 69 y.o. 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  Patient states during his hospitalization at [facility] he was treated with a steroid and felt better afterwards.  Worsening weakness in his right leg and numbness in his right hand x1 week now resolving numbness - using walker to ambulate safely.  Denies any changes in vision or difficulty with his speech.  He has been vaccinated against Covid.  Denies cough, shortness of breath, nausea, vomiting, abdominal pain, or diarrhea.  Under Care Everywhere: Patient was admitted to [facility] in June with complaints of ataxia and difficulty walking.  He had a tick bite a month prior, was initially treated with doxycycline which he was not able to tolerate, later transitioned to amoxicillin.  MRI of the brain was concerning for wide differential including Lyme neuroborreliosis, acute demyelinating disease, vasculitis, sarcoidosis, and less likely neoplasm.  MRI of cervical and thoracic spine without lesions. Spinal tap was positive for oligoclonal bands and it showed high CSF protein 109.  CSF MD panel negative.  CMV, EBV, varicella, HSV CSF PCR is negative.  CSF bacterial and fungal cultures with no growth and Gram stain negative.  RPR negative, HIV negative.  Lyme disease antibody panel negative.  Lyme CSF serology negative.  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  He had further work-up scheduled with brain and cervical MRI, CT chest/abdomen/pelvis, NMO Ab, and MOG Ab.   Brain MRI done at [facility] on 07/22/2019: "1. Multiple rounded lesions within the periventricular and subcortical white matter, with the largest measuring up to 3.5 cm within the right temporal lobe as described above. Several of the lesions demonstrate peripheral restricted diffusion and heterogeneous enhancement in the bilateral frontal lobes. 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 Additional differential considerations include acute demyelinating disease, vasculitis, or sarcoidosis. Neoplasm such as lymphoma would be in the differential although the imaging appearance is not typical. Metastatic disease is also in the differential but considered less likely given that some of the lesions do not enhance and given lack of substantial surrounding edema.  2. Query slightly asymmetric smooth enhancement of the left facial nerve in the temporal bone as it courses towards the stylomastoid foramen."  ED Course: Blood pressure slightly elevated, remainder of vital signs stable.  WBC 7.1, hemoglobin 16.6, hematocrit 50.9, platelet 194K.  Sodium 141, potassium 4.9, chloride 104, bicarb 27, BUN 15, creatinine 1.1, glucose 89.  UA not suggestive of infection.  SARS-CoV-2 PCR test pending.  MRI brain and C-spine showing findings concerning for demyelinating disease. Neurology consulted. Patient was given ceftriaxone.  Assessment & Plan:   Principal Problem:   Right leg weakness Active Problems:   Essential hypertension   Hyperlipidemia   BPH (benign prostatic hyperplasia)  Acute on chronic ambulatory dysfunction with recurrent falls in the setting of right leg weakness, right hand numbness,  POA - Previous MRI brain and C-spine with findings concerning for a demyelinating disease. - Neurology  following, indicates his presentation was felt to be atypical for MS.   - His presentation was felt to be either due to vasculitis or atypical infection or a paraneoplastic process.   - Infectious disease asked to evaluate given his complex history and previous testing, ID recommends to stop antibiotics given no current infectious process ongoing and previous testing outpatient ID was essentially resolved/negative. - Repeat MRI head, CT chest abdomen pelvis as below, no overt infectious process ongoing, small 2.7 x 2.6 cm hypodensity of the gastric fundus, follow screening images per protocol with PCP in the next 3 to 6 months.  Hypertension: Systolic currently in the 140s. -Continue home medications  Hyperlipidemia -Continue home medications  BPH, with lower urinary tract syndrome -Patient reports stopping his home Flomax, when asked why he reports "because I wanted to" -Resume Flomax, patient required in and out catheter at admission with greater than 500 cc of urine - foley placed given ongoing symptoms   DVT prophylaxis: Lovenox Code Status: Full code Family Communication:  Updated daughter over the phone  Status is: Inpatient  Dispo: The patient is from: Home              Anticipated d/c is to: Likely home              Anticipated d/c date is: 48 to 72 hours pending clinical course              Patient currently not medically stable for discharge given ongoing need for further evaluation and IV steroids  Consultants:   Neurology, infectious disease  Procedures:   None planned  Antimicrobials:  Rifampin  Subjective: No acute issues or events overnight, he denies nausea, vomiting, diarrhea, constipation, headache, fevers, chills.  Indicates his right lower extremity still feels somewhat weak compared to the left but paresthesias have resolved and weakness is slightly improving. Looking forward to PT.  Objective: Vitals:   11/06/19 0950 11/06/19 1238 11/07/19 0800  11/07/19 1246  BP: (!) 159/83 (!) 147/79  (!) 142/80  Pulse:  73  68  Resp:  16 16 16   Temp:  98.8 F (37.1 C)  98.6 F (37 C)  TempSrc:  Oral  Oral  SpO2:  99%  95%  Weight:      Height:        Intake/Output Summary (Last 24 hours) at 11/07/2019 1437 Last data filed at 11/06/2019 2300 Gross per 24 hour  Intake 240 ml  Output 300 ml  Net -60 ml   Filed Weights   11/05/19 2040  Weight: 90.3 kg    Examination:  General: Resting comfortably in bed, No acute distress. HEENT:  Normocephalic atraumatic.  Sclerae nonicteric, noninjected.  Extraocular movements intact bilaterally. Neck:  Without mass or deformity.  Trachea is midline. Lungs:  Clear to auscultate bilaterally without rhonchi, wheeze, or rales. Heart:  Regular rate and rhythm.  Without murmurs, rubs, or gallops. Abdomen:  Soft, nontender, nondistended.  Without guarding or rebound. Extremities: Without cyanosis, clubbing, edema, or obvious deformity.  No overt weakness on physical exam, bilateral lower extremities and upper extremities 5 out of 5 strength. Vascular:  Dorsalis pedis and posterior tibial pulses palpable bilaterally. Skin:  Warm and dry, no erythema, no ulcerations.  Data Reviewed: I have personally reviewed following labs and imaging studies  CBC: Recent Labs  Lab 11/04/19 1436 11/06/19 0735 11/07/19 0307  WBC 7.1 6.2  12.1*  NEUTROABS 4.0  --   --   HGB 16.6 15.3 15.9  HCT 50.9 46.7 48.1  MCV 88.8 88.3 87.0  PLT 194 236 161   Basic Metabolic Panel: Recent Labs  Lab 11/04/19 1436 11/06/19 0735 11/07/19 0307  NA 141 140 139  K 4.9 3.3* 3.7  CL 104 103 104  CO2 27 28 24   GLUCOSE 89 134* 136*  BUN 15 14 15   CREATININE 1.14 1.16 1.04  CALCIUM 9.8 9.2 9.4   GFR: Estimated Creatinine Clearance: 74.6 mL/min (by C-G formula based on SCr of 1.04 mg/dL). Liver Function Tests: Recent Labs  Lab 11/06/19 0735 11/07/19 0307  AST 18 18  ALT 13 15  ALKPHOS 47 48  BILITOT 1.2 0.9  PROT  6.6 6.5  ALBUMIN 3.6 3.6   No results for input(s): LIPASE, AMYLASE in the last 168 hours. No results for input(s): AMMONIA in the last 168 hours. Coagulation Profile: No results for input(s): INR, PROTIME in the last 168 hours. Cardiac Enzymes: No results for input(s): CKTOTAL, CKMB, CKMBINDEX, TROPONINI in the last 168 hours. BNP (last 3 results) No results for input(s): PROBNP in the last 8760 hours. HbA1C: No results for input(s): HGBA1C in the last 72 hours. CBG: No results for input(s): GLUCAP in the last 168 hours. Lipid Profile: No results for input(s): CHOL, HDL, LDLCALC, TRIG, CHOLHDL, LDLDIRECT in the last 72 hours. Thyroid Function Tests: No results for input(s): TSH, T4TOTAL, FREET4, T3FREE, THYROIDAB in the last 72 hours. Anemia Panel: Recent Labs    11/04/19 2234  VITAMINB12 357   Sepsis Labs: No results for input(s): PROCALCITON, LATICACIDVEN in the last 168 hours.  Recent Results (from the past 240 hour(s))  Respiratory Panel by RT PCR (Flu A&B, Covid) - Vein     Status: None   Collection Time: 11/04/19 11:55 PM   Specimen: Vein; Nasopharyngeal  Result Value Ref Range Status   SARS Coronavirus 2 by RT PCR NEGATIVE NEGATIVE Final    Comment: (NOTE) SARS-CoV-2 target nucleic acids are NOT DETECTED.  The SARS-CoV-2 RNA is generally detectable in upper respiratoy specimens during the acute phase of infection. The lowest concentration of SARS-CoV-2 viral copies this assay can detect is 131 copies/mL. A negative result does not preclude SARS-Cov-2 infection and should not be used as the sole basis for treatment or other patient management decisions. A negative result may occur with  improper specimen collection/handling, submission of specimen other than nasopharyngeal swab, presence of viral mutation(s) within the areas targeted by this assay, and inadequate number of viral copies (<131 copies/mL). A negative result must be combined with  clinical observations, patient history, and epidemiological information. The expected result is Negative.  Fact Sheet for Patients:  PinkCheek.be  Fact Sheet for Healthcare Providers:  GravelBags.it  This test is no t yet approved or cleared by the [place] FDA and  has been authorized for detection and/or diagnosis of SARS-CoV-2 by FDA under an Emergency Use Authorization (EUA). This EUA will remain  in effect (meaning this test can be used) for the duration of the COVID-19 declaration under Section 564(b)(1) of the Act, 21 U.S.C. section 360bbb-3(b)(1), unless the authorization is terminated or revoked sooner.     Influenza A by PCR NEGATIVE NEGATIVE Final   Influenza B by PCR NEGATIVE NEGATIVE Final    Comment: (NOTE) The Xpert Xpress SARS-CoV-2/FLU/RSV assay is intended as an aid in  the diagnosis of influenza from Nasopharyngeal swab specimens and  should not be  used as a sole basis for treatment. Nasal washings and  aspirates are unacceptable for Xpert Xpress SARS-CoV-2/FLU/RSV  testing.  Fact Sheet for Patients: PinkCheek.be  Fact Sheet for Healthcare Providers: GravelBags.it  This test is not yet approved or cleared by the [place] FDA and  has been authorized for detection and/or diagnosis of SARS-CoV-2 by  FDA under an Emergency Use Authorization (EUA). This EUA will remain  in effect (meaning this test can be used) for the duration of the  Covid-19 declaration under Section 564(b)(1) of the Act, 21  U.S.C. section 360bbb-3(b)(1), unless the authorization is  terminated or revoked. Performed at [facility], [place] [address], [place], [place] [address]          Radiology Studies: No results found. Scheduled Meds: . aspirin  325 mg Oral QHS  . atorvastatin  20 mg Oral QHS  . brimonidine  1 drop Both Eyes TID  . Chlorhexidine  Gluconate Cloth  6 each Topical Daily  . dorzolamide-timolol  1 drop Both Eyes BID  . enoxaparin (LOVENOX) injection  40 mg Subcutaneous Q24H  . lisinopril  10 mg Oral Daily  . metoprolol succinate  25 mg Oral Daily  . Netarsudil Dimesylate  1 drop Both Eyes QHS  . pantoprazole (PROTONIX) IV  40 mg Intravenous Q24H  . tamsulosin  0.8 mg Oral QHS   Continuous Infusions: . methylPREDNISolone (SOLU-MEDROL) injection 1,000 mg (11/07/19 0940)     LOS: 3 days   Time spent: 74min  Decarlo Rivet C Kaitrin Seybold, DO [facility]  If 7PM-7AM, please contact night-coverage [email or web address]  11/07/2019, 2:37 PM

## 2019-11-08 LAB — CBC
HCT: 47 % (ref 39.0–52.0)
Hemoglobin: 16 g/dL (ref 13.0–17.0)
MCH: 30 pg (ref 26.0–34.0)
MCHC: 34 g/dL (ref 30.0–36.0)
MCV: 88.2 fL (ref 80.0–100.0)
Platelets: 241 10*3/uL (ref 150–400)
RBC: 5.33 MIL/uL (ref 4.22–5.81)
RDW: 12.3 % (ref 11.5–15.5)
WBC: 17.1 10*3/uL — ABNORMAL HIGH (ref 4.0–10.5)
nRBC: 0 % (ref 0.0–0.2)

## 2019-11-08 LAB — COMPREHENSIVE METABOLIC PANEL
ALT: 14 U/L (ref 0–44)
AST: 15 U/L (ref 15–41)
Albumin: 3.5 g/dL (ref 3.5–5.0)
Alkaline Phosphatase: 51 U/L (ref 38–126)
Anion gap: 10 (ref 5–15)
BUN: 20 mg/dL (ref 8–23)
CO2: 26 mmol/L (ref 22–32)
Calcium: 9.1 mg/dL (ref 8.9–10.3)
Chloride: 104 mmol/L (ref 98–111)
Creatinine, Ser: 1.06 mg/dL (ref 0.61–1.24)
GFR calc non Af Amer: >60 mL/min (ref >60)
Glucose, Bld: 115 mg/dL — ABNORMAL HIGH (ref 70–99)
Potassium: 3.8 mmol/L (ref 3.5–5.1)
Sodium: 140 mmol/L (ref 135–145)
Total Bilirubin: 0.6 mg/dL (ref 0.3–1.2)
Total Protein: 6.3 g/dL — ABNORMAL LOW (ref 6.5–8.1)

## 2019-11-08 NOTE — Progress Notes (Signed)
Physical Therapy Treatment Patient Details Name: Bryan Robertson MRN: [id number] DOB: 04-Feb-1950 Today's Date: 11/08/2019    History of Present Illness Bryan Robertson is a 69 y.o. 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 No clear enhancement in these regions to suggest active demyelination. 2. 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    PT Comments    Pt supine in bed on arrival.  He was lying in stool and unaware of this.  He required multiple reps of rolling to clean peri-area.  Pt performed all aspects of mobility with min assistance.  He is improving slowly and continues to benefit from skilled rehab in a home setting.  Pt has support from his family at d/c.     Follow Up Recommendations  Home health PT;Supervision/Assistance - 24 hour (will require 24 hr assistance for safety with mobility)     Equipment Recommendations  Wheelchair cushion (measurements PT);Wheelchair (measurements PT)    Recommendations for Other Services       Precautions / Restrictions Precautions Precautions: Fall    Mobility  Bed Mobility Overal bed mobility: Needs Assistance Bed Mobility: Supine to Sit;Sit to Supine     Supine to sit: HOB elevated;Min assist Sit to supine: Min guard   General bed mobility comments: Pt require light min with HOB elevated and heavy use of rails.  Limited assistance to return back to bed.  Transfers Overall transfer level: Needs assistance Equipment used: Rolling walker (2 wheeled) Transfers: Sit to/from [facility] Sit to Stand: Min assist;From elevated surface         General transfer  comment: min assist to power up and steady from EOB with cueing for anterior weight shift, and hand placement.  Ambulation/Gait Ambulation/Gait assistance: Min assist Gait Distance (Feet): 40 Feet Assistive device: Rolling walker (2 wheeled) Gait Pattern/deviations: Step-through pattern;Trunk flexed;Shuffle;Decreased stride length Gait velocity: slowed   General Gait Details: Min assistance for steadying and assistance with turning device.  Constant cues for forward gaze, head control and scapular retraction. Pt fatigues quickly with shorter steps noted at end of gt training.   Stairs             Wheelchair Mobility    Modified Rankin (Stroke Patients Only)       Balance Overall balance assessment: Needs assistance Sitting-balance support: No upper extremity supported;Feet supported;Single extremity supported Sitting balance-Leahy Scale: Fair       Standing balance-Leahy Scale: Poor                              Cognition Arousal/Alertness: Awake/alert Behavior During Therapy: WFL for tasks assessed/performed Overall Cognitive Status: Impaired/Different from baseline Area of Impairment: Awareness;Problem solving;Safety/judgement                         Safety/Judgement: Decreased awareness of safety;Decreased awareness of deficits Awareness: Emergent Problem Solving: Slow processing;Difficulty sequencing;Requires verbal cues;Requires tactile cues General Comments: Pt incontinent of bowel movement and unaware this had occured.      Exercises      General Comments        Pertinent Vitals/Pain  Pain Assessment: No/denies pain    Home Living                      Prior Function            PT Goals (current goals can now be found in the care plan section) Acute Rehab PT Goals Patient Stated Goal: to go home and do therapy there Potential to Achieve Goals: Good Progress towards PT goals: Progressing toward goals     Frequency    Min 3X/week      PT Plan Current plan remains appropriate    Co-evaluation              AM-PAC PT "6 Clicks" Mobility   Outcome Measure  Help needed turning from your back to your side while in a flat bed without using bedrails?: A Little Help needed moving from lying on your back to sitting on the side of a flat bed without using bedrails?: A Little Help needed moving to and from a bed to a chair (including a wheelchair)?: A Little Help needed standing up from a chair using your arms (e.g., wheelchair or bedside chair)?: A Little Help needed to walk in hospital room?: A Little Help needed climbing 3-5 steps with a railing? : A Little 6 Click Score: 18    End of Session Equipment Utilized During Treatment: Gait belt Activity Tolerance: Patient tolerated treatment well Patient left: in bed;with call bell/phone within reach;with bed alarm set Nurse Communication: Mobility status PT Visit Diagnosis: Other abnormalities of gait and mobility (R26.89);Difficulty in walking, not elsewhere classified (R26.2);Other symptoms and signs involving the nervous system (R29.898)     Time: 1478-2956 PT Time Calculation (min) (ACUTE ONLY): 34 min  Charges:  $Gait Training: 8-22 mins $Therapeutic Activity: 8-22 mins                     Bryan Robertson , PTA Acute Rehabilitation Services Pager [phone] Office [phone]     Bryan Robertson 11/08/2019, 5:04 PM

## 2019-11-08 NOTE — Progress Notes (Signed)
PROGRESS NOTE    Bryan Robertson  NWG:[id number] DOB: 12/03/1950 DOA: 11/04/2019 PCP: Assunta Curtis, FNP   Brief Narrative:  Bryan Robertson is a fairly unpleasant 69 y.o. 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  Patient states during his hospitalization at [facility] he was treated with a steroid and felt better afterwards.  Worsening weakness in his right leg and numbness in his right hand x1 week now resolving numbness - using walker to ambulate safely.  Denies any changes in vision or difficulty with his speech.  He has been vaccinated against Covid.  Denies cough, shortness of breath, nausea, vomiting, abdominal pain, or diarrhea.   Assessment & Plan:   Principal Problem:   Right leg weakness Active Problems:   Essential hypertension   Hyperlipidemia   BPH (benign prostatic hyperplasia)  Acute on chronic ambulatory dysfunction with recurrent falls in the setting of right leg weakness, right hand numbness, POA - Previous MRI brain and C-spine with findings concerning for a demyelinating disease. - Neurology following, indicates his presentation was felt to be atypical for MS.   - His presentation was felt to be either due to vasculitis or atypical infection or a paraneoplastic process.   - Infectious disease asked to evaluate given his complex history and previous testing, ID recommends to stop antibiotics given no current infectious process ongoing and previous testing outpatient ID was essentially resolved/negative. - Repeat MRI head, CT chest abdomen pelvis as below, no overt infectious process ongoing, small 2.7 x 2.6 cm hypodensity of the gastric fundus, follow screening images per protocol with PCP in the next 3 to 6 months.  Hypertension, essential -Continue home medications; borderline controlled, patient  requesting to follow with PCP for blood pressure medication changes which is certainly reasonable, systolic blood pressure ranging 120s to 140 at this point.  Hyperlipidemia -Continue home medications  BPH, with lower urinary tract syndrome -Patient reports stopping his home Flomax, when asked why he reports "because I wanted to" -Resume Flomax, patient required in and out catheter at admission with greater than 500 cc of urine - foley placed given ongoing symptoms   DVT prophylaxis: Lovenox Code Status: Full code Family Communication:  Updated daughter over the phone  Status is: Inpatient  Dispo: The patient is from: Home              Anticipated d/c is to: Likely home              Anticipated d/c date is: 24-48 hours pending clinical course              Patient currently not medically stable for discharge given ongoing need for further evaluation and IV steroids  Consultants:   Neurology, infectious disease  Procedures:   None planned  Antimicrobials:  Rifampin -11/04/2019-11/05/2019  Subjective: No acute issues or events overnight, denies nausea, vomiting, diarrhea, constipation, headache, fevers, chills.  Patient indicates his right lower extremity is markedly improved from previous but not yet back to baseline.  He still feels somewhat weak with ambulation but otherwise denies paresthesias.  Objective: Vitals:   11/07/19 0800 11/07/19 1246 11/07/19 1710 11/08/19 0508  BP:  (!) 142/80 134/80 129/73  Pulse:  68 79 72  Resp: 16 16 16 19   Temp:  98.6 F (37 C) 98.5 F (36.9 C) 98.9 F (37.2 C)  TempSrc:  Oral Oral Oral  SpO2:  95% 95% 95%  Weight:      Height:        Intake/Output Summary (Last 24 hours) at 11/08/2019 0810 Last data filed at 11/07/2019 1500 Gross per 24 hour  Intake 112.45 ml  Output -  Net 112.45 ml   Filed Weights   11/05/19 2040  Weight: 90.3 kg    Examination:  General:  Pleasantly resting in bed, No acute distress. HEENT:   Normocephalic atraumatic.  Sclerae nonicteric, noninjected.  Extraocular movements intact bilaterally. Neck:  Without mass or deformity.  Trachea is midline. Lungs:  Clear to auscultate bilaterally without rhonchi, wheeze, or rales. Heart:  Regular rate and rhythm.  Without murmurs, rubs, or gallops. Abdomen:  Soft, nontender, nondistended.  Without guarding or rebound. Extremities: Without cyanosis, clubbing, edema, or obvious deformity. Vascular:  Dorsalis pedis and posterior tibial pulses palpable bilaterally. Skin:  Warm and dry, no erythema, no ulcerations.   Data Reviewed: I have personally reviewed following labs and imaging studies  CBC: Recent Labs  Lab 11/04/19 1436 11/06/19 0735 11/07/19 0307 11/08/19 0255  WBC 7.1 6.2 12.1* 17.1*  NEUTROABS 4.0  --   --   --   HGB 16.6 15.3 15.9 16.0  HCT 50.9 46.7 48.1 47.0  MCV 88.8 88.3 87.0 88.2  PLT 194 236 248 253   Basic Metabolic Panel: Recent Labs  Lab 11/04/19 1436 11/06/19 0735 11/07/19 0307 11/08/19 0255  NA 141 140 139 140  K 4.9 3.3* 3.7 3.8  CL 104 103 104 104  CO2 27 28 24 26   GLUCOSE 89 134* 136* 115*  BUN 15 14 15 20   CREATININE 1.14 1.16 1.04 1.06  CALCIUM 9.8 9.2 9.4 9.1   GFR: Estimated Creatinine Clearance: 73.2 mL/min (by C-G formula based on SCr of 1.06 mg/dL). Liver Function Tests: Recent Labs  Lab 11/06/19 0735 11/07/19 0307 11/08/19 0255  AST 18 18 15   ALT 13 15 14   ALKPHOS 47 48 51  BILITOT 1.2 0.9 0.6  PROT 6.6 6.5 6.3*  ALBUMIN 3.6 3.6 3.5   No results for input(s): LIPASE, AMYLASE in the last 168 hours. No results for input(s): AMMONIA in the last 168 hours. Coagulation Profile: No results for input(s): INR, PROTIME in the last 168 hours. Cardiac Enzymes: No results for input(s): CKTOTAL, CKMB, CKMBINDEX, TROPONINI in the last 168 hours. BNP (last 3 results) No results for input(s): PROBNP in the last 8760 hours. HbA1C: No results for input(s): HGBA1C in the last 72  hours. CBG: No results for input(s): GLUCAP in the last 168 hours. Lipid Profile: No results for input(s): CHOL, HDL, LDLCALC, TRIG, CHOLHDL, LDLDIRECT in the last 72 hours. Thyroid Function Tests: No results for input(s): TSH, T4TOTAL, FREET4, T3FREE, THYROIDAB in the last 72 hours. Anemia Panel: No results for input(s): VITAMINB12, FOLATE, FERRITIN, TIBC, IRON, RETICCTPCT in the last 72 hours. Sepsis Labs: No results for input(s): PROCALCITON, LATICACIDVEN in the last 168 hours.  Recent Results (from the past 240 hour(s))  Respiratory Panel by RT PCR (Flu A&B, Covid) - Vein     Status: None   Collection Time: 11/04/19 11:55 PM   Specimen: Vein; Nasopharyngeal  Result Value Ref Range Status   SARS Coronavirus 2 by RT PCR NEGATIVE NEGATIVE Final    Comment: (NOTE) SARS-CoV-2 target nucleic acids are NOT DETECTED.  The SARS-CoV-2 RNA is generally detectable in upper respiratoy specimens during the acute phase of infection. The lowest concentration of SARS-CoV-2 viral copies this assay can detect is  131 copies/mL. A negative result does not preclude SARS-Cov-2 infection and should not be used as the sole basis for treatment or other patient management decisions. A negative result may occur with  improper specimen collection/handling, submission of specimen other than nasopharyngeal swab, presence of viral mutation(s) within the areas targeted by this assay, and inadequate number of viral copies (<131 copies/mL). A negative result must be combined with clinical observations, patient history, and epidemiological information. The expected result is Negative.  Fact Sheet for Patients:  PinkCheek.be  Fact Sheet for Healthcare Providers:  GravelBags.it  This test is no t yet approved or cleared by the [place] FDA and  has been authorized for detection and/or diagnosis of SARS-CoV-2 by FDA under an Emergency Use  Authorization (EUA). This EUA will remain  in effect (meaning this test can be used) for the duration of the COVID-19 declaration under Section 564(b)(1) of the Act, 21 U.S.C. section 360bbb-3(b)(1), unless the authorization is terminated or revoked sooner.     Influenza A by PCR NEGATIVE NEGATIVE Final   Influenza B by PCR NEGATIVE NEGATIVE Final    Comment: (NOTE) The Xpert Xpress SARS-CoV-2/FLU/RSV assay is intended as an aid in  the diagnosis of influenza from Nasopharyngeal swab specimens and  should not be used as a sole basis for treatment. Nasal washings and  aspirates are unacceptable for Xpert Xpress SARS-CoV-2/FLU/RSV  testing.  Fact Sheet for Patients: PinkCheek.be  Fact Sheet for Healthcare Providers: GravelBags.it  This test is not yet approved or cleared by the [place] FDA and  has been authorized for detection and/or diagnosis of SARS-CoV-2 by  FDA under an Emergency Use Authorization (EUA). This EUA will remain  in effect (meaning this test can be used) for the duration of the  Covid-19 declaration under Section 564(b)(1) of the Act, 21  U.S.C. section 360bbb-3(b)(1), unless the authorization is  terminated or revoked. Performed at [facility], [place] [address] [place], [place] [address]          Radiology Studies: No results found. Scheduled Meds: . aspirin  325 mg Oral QHS  . atorvastatin  20 mg Oral QHS  . brimonidine  1 drop Both Eyes TID  . Chlorhexidine Gluconate Cloth  6 each Topical Daily  . dorzolamide-timolol  1 drop Both Eyes BID  . enoxaparin (LOVENOX) injection  40 mg Subcutaneous Q24H  . lisinopril  10 mg Oral Daily  . metoprolol succinate  25 mg Oral Daily  . Netarsudil Dimesylate  1 drop Both Eyes QHS  . pantoprazole (PROTONIX) IV  40 mg Intravenous Q24H  . tamsulosin  0.8 mg Oral QHS   Continuous Infusions: . methylPREDNISolone (SOLU-MEDROL) injection Stopped  (11/07/19 1042)     LOS: 4 days   Time spent: 70min  William C Lancaster, DO [facility]  If 7PM-7AM, please contact night-coverage [email or web address]  11/08/2019, 8:10 AM

## 2019-11-08 NOTE — Progress Notes (Signed)
Occupational Therapy Treatment Patient Details Name: Bryan Robertson MRN: [id number] DOB: Jul 17, 1950 Today's Date: 11/08/2019    History of present illness Bryan Robertson is a 69 y.o. 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 No clear enhancement in these regions to suggest active demyelination. 2. 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   OT comments  Patient supine in bed and agreeable to OT session.  Min assist for bed mobility given cueing for technique and safety, seated EOB dynamic balance during ADL engagement with min assist fading to min guard due to posterior R lateral lean.  Min assist for UB bathing/changing gown, LB bathing with mod assist.  Transfer to recliner with min assist using RW.  Discussed need for 24/7 support at home, pt reports he will have assist from daughter, son in law and spouse.  Will follow acutely.    Follow Up Recommendations  Home health OT;Supervision/Assistance - 24 hour    Equipment Recommendations  3 in 1 bedside commode    Recommendations for Other Services      Precautions / Restrictions Precautions Precautions: Fall Restrictions Weight Bearing Restrictions: No       Mobility Bed Mobility Overal bed mobility: Needs Assistance Bed Mobility: Supine to Sit     Supine to sit: HOB elevated;Min assist     General bed mobility comments: cueing for technique, min assist for trunk control and scooting foward   Transfers Overall transfer level: Needs assistance Equipment used: Rolling walker (2 wheeled) Transfers: Sit to/from [facility] Sit to Stand: Min assist Stand pivot  transfers: Min assist       General transfer comment: min assist to power up and steady from EOB with cueing for anterior weight shift, min assist to pivot to recliner; verbal cueing throughout for safety     Balance Overall balance assessment: Needs assistance Sitting-balance support: No upper extremity supported;Feet supported;Single extremity supported Sitting balance-Leahy Scale: Fair Sitting balance - Comments: preference to 1 UE support fading to min guard dynamically; posterior lean to R  Postural control: Posterior lean;Right lateral lean Standing balance support: Bilateral upper extremity supported;During functional activity Standing balance-Leahy Scale: Poor Standing balance comment: relies on UE and external support                           ADL either performed or assessed with clinical judgement   ADL Overall ADL's : Needs assistance/impaired     Grooming: Min guard;Sitting   Upper Body Bathing: Sitting;Min guard   Lower Body Bathing: Moderate assistance;Sit to/from stand Lower Body Bathing Details (indicate cue type and reason): washing B LEs to promote anterior lean, needs min assist sit to stand and assist to wash feet/buttocks  Upper Body Dressing : Minimal assistance;Sitting       Toilet Transfer: Minimal assistance;Stand-pivot;RW Toilet Transfer Details (indicate cue type and reason): simulated to recliner          Functional mobility during ADLs: Minimal assistance;Rolling walker General ADL Comments: pt limited by impaired balance, decreased activity toelrance and weakness      Vision   Vision Assessment?: No apparent visual deficits   Perception  Praxis      Cognition Arousal/Alertness: Awake/alert Behavior During Therapy: WFL for tasks assessed/performed Overall Cognitive Status: Impaired/Different from baseline Area of Impairment: Awareness;Problem solving;Safety/judgement                          Safety/Judgement: Decreased awareness of safety;Decreased awareness of deficits Awareness: Emergent Problem Solving: Slow processing;Difficulty sequencing;Requires verbal cues;Requires tactile cues          Exercises     Shoulder Instructions       General Comments VSS    Pertinent Vitals/ Pain       Pain Assessment: No/denies pain  Home Living                                          Prior Functioning/Environment              Frequency  Min 2X/week        Progress Toward Goals  OT Goals(current goals can now be found in the care plan section)  Progress towards OT goals: Progressing toward goals  Acute Rehab OT Goals Patient Stated Goal: to go home and do therapy there OT Goal Formulation: With patient  Plan Discharge plan remains appropriate;Frequency remains appropriate    Co-evaluation                 AM-PAC OT "6 Clicks" Daily Activity     Outcome Measure   Help from another person eating meals?: None Help from another person taking care of personal grooming?: A Little Help from another person toileting, which includes using toliet, bedpan, or urinal?: A Lot Help from another person bathing (including washing, rinsing, drying)?: A Lot Help from another person to put on and taking off regular upper body clothing?: A Little Help from another person to put on and taking off regular lower body clothing?: A Lot 6 Click Score: 16    End of Session Equipment Utilized During Treatment: Gait belt;Rolling walker  OT Visit Diagnosis: Other abnormalities of gait and mobility (R26.89);Muscle weakness (generalized) (M62.81);Other symptoms and signs involving cognitive function   Activity Tolerance Patient tolerated treatment well   Patient Left in chair;with call bell/phone within reach;with chair alarm set;with nursing/sitter in room   Nurse Communication Mobility status        Time: 1131-1149 OT Time Calculation (min): 18  min  Charges: OT General Charges $OT Visit: 1 Visit OT Treatments $Self Care/Home Management : 8-22 mins  Jolaine Artist, OT [place] Pager [phone] Office [phone]    Bryan Robertson 11/08/2019, 12:06 PM

## 2019-11-08 NOTE — Progress Notes (Addendum)
NEUROLOGY PROGRESS NOTE  Subjective: Feels that his strength has improved but now feels the left side seems weaker than the right. He was having tingling in his thumb and index finger since August, but this is also improving.Otherwise no numbness, tingling, no visual changes.    Exam: Vitals:   11/07/19 1710 11/08/19 0508  BP:  129/73  Pulse: 79 72  Resp: 16 19  Temp: 98.5 F (36.9 C) 98.9 F (37.2 C)  SpO2: 95% 95%    Neuro:  Mental Status: Alert, oriented, thought content appropriate, pleasant.  Speech fluent without evidence of aphasia.  Able to follow 3 step commands without difficulty. Cranial Nerves: II:  Visual fields normal III,IV, VI: No ptosis, EOM intact, PERRLA, + left and right beating horizontal nystagmus  V,VII: smile symmetric, decreased sensation left V2, V3 distribution   XII: midline tongue extension Motor: Right :  Upper extremity grip, finger separation, elbow flexion/extension, arm flexion/abduction strength 5/5 Lower extremity leg flexion/extension strength 4/5, knee flexion/extension, plantar/dorsiflexion 5/5   Left:     Upper extremity  grip, finger separation, elbow flexion/extension, arm flexion/abduction strength 5/5, +dysmetria  Lower extremity  Leg extension/flexion 3/5; knee flexion/extension, plantar/dorsiflexion 5/5   Tone and bulk:normal tone throughout; no atrophy noted Sensory: Pinprick and light touch intact throughout, bilaterally Deep Tendon Reflexes: 2+ and symmetric throughout   Medications:  Scheduled: . aspirin  325 mg Oral QHS  . atorvastatin  20 mg Oral QHS  . brimonidine  1 drop Both Eyes TID  . Chlorhexidine Gluconate Cloth  6 each Topical Daily  . dorzolamide-timolol  1 drop Both Eyes BID  . enoxaparin (LOVENOX) injection  40 mg Subcutaneous Q24H  . lisinopril  10 mg Oral Daily  . metoprolol succinate  25 mg Oral Daily  . Netarsudil Dimesylate  1 drop Both Eyes QHS  . pantoprazole (PROTONIX) IV  40 mg Intravenous Q24H  .  tamsulosin  0.8 mg Oral QHS    Pertinent Labs/Diagnostics: MR Brain W and Wo Contrast: There are numerous T2/FLAIR hyperintense infratentorial and supratentorial lesions, which are detailed above and compatible with demyelinating disease. A few of the periventricular lesions restrict diffusion and peripherally enhance, compatible with active demyelination.  MR Cervical Spine W or Wo Contrast 1. Focal short-segment T2 hyperintensity within the dorsolateral right cord at C2-C3 and C3-C4, which is compatible with demyelinating disease given findings on concurrent MRI head. No clear enhancement in these regions to suggest active demyelination. 2. 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   Assessment: 69 y.o. male with PMH significant for CNS Demyelination on MRI, recently treated with IV steroids at [facility], OSH, HTN and HLD who presents with reported R sided weakness but weak on the left on exam. His neurologic examination was initially notable for Left hand grip weakness and Left hip flexion weakness.  1. 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 MRI C spine reveals cord lesions with morphologies and locations suggestive of MS.  2. However, there are several atypical features of his overall presentation. He is an above 65 male and the incidence of new onset MS is very low in this population. He has elevated CSF protein which can be seen on MS relapses but it is not typically this high and there is slight asymmetric smooth enhancement of the left facial  nerve which is also atypical for multiple sclerosis. The enhancement in some of the lesions is heterogenous, which is possible in MS, especially with subacute lesions. 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  He was treated in the past with amoxicillin.  They do not feel that he needs any further antibiotics at this time. They agree with administering methylprednisolone. 3. A vasculitis is on the DDx, but the locations, morphologies and signal characteristics of the lesions would be quite atypical for this.  4. A slowly progressive cerebral infection such as can be seen with Lyme disease, ehrlichiosis or RMSF has been considered.  However, the imaging findings would be atypical and ID also does not feel that his presentation is due to infection (see #2 above).  5. The lesion distribution and discrete morphologies would be highly atypical for a paraneoplastic process.   Recommendations: 1.  Continue IV pulsed-dose steroids: 1000 mg methylprednisolone for 5 days.  2. Monitor CBG, CBC and chem7 while on IV steroids.  We will also administer Protonix 40 mg daily for gut protection. 3.  Will need outpatient follow-up with his  primary Neurologist.   Marty Heck, DO 11/08/2019, 10:47 AM Pager: [phone]  Electronically signed: Dr. Kerney Elbe

## 2019-11-09 LAB — CBC
HCT: 46.4 % (ref 39.0–52.0)
Hemoglobin: 15.3 g/dL (ref 13.0–17.0)
MCH: 29.1 pg (ref 26.0–34.0)
MCHC: 33 g/dL (ref 30.0–36.0)
MCV: 88.4 fL (ref 80.0–100.0)
Platelets: 238 10*3/uL (ref 150–400)
RBC: 5.25 MIL/uL (ref 4.22–5.81)
RDW: 12.2 % (ref 11.5–15.5)
WBC: 16.4 10*3/uL — ABNORMAL HIGH (ref 4.0–10.5)
nRBC: 0 % (ref 0.0–0.2)

## 2019-11-09 LAB — COMPREHENSIVE METABOLIC PANEL
ALT: 15 U/L (ref 0–44)
AST: 16 U/L (ref 15–41)
Albumin: 3.5 g/dL (ref 3.5–5.0)
Alkaline Phosphatase: 52 U/L (ref 38–126)
Anion gap: 12 (ref 5–15)
BUN: 22 mg/dL (ref 8–23)
CO2: 25 mmol/L (ref 22–32)
Calcium: 8.9 mg/dL (ref 8.9–10.3)
Chloride: 105 mmol/L (ref 98–111)
Creatinine, Ser: 1.24 mg/dL (ref 0.61–1.24)
GFR calc non Af Amer: 59 mL/min — ABNORMAL LOW (ref >60)
Glucose, Bld: 118 mg/dL — ABNORMAL HIGH (ref 70–99)
Potassium: 3.7 mmol/L (ref 3.5–5.1)
Sodium: 142 mmol/L (ref 135–145)
Total Bilirubin: 0.6 mg/dL (ref 0.3–1.2)
Total Protein: 6.2 g/dL — ABNORMAL LOW (ref 6.5–8.1)

## 2019-11-09 MED ORDER — PANTOPRAZOLE SODIUM 40 MG PO TBEC
40.0000 mg | DELAYED_RELEASE_TABLET | Freq: Every day | ORAL | Status: DC
  Administered 2019-11-09 – 2019-11-10 (×2): 40 mg via ORAL
  Filled 2019-11-09 (×2): qty 1

## 2019-11-09 NOTE — Care Management Important Message (Signed)
Important Message  Patient Details  Name: Bryan Robertson MRN: [id number] Date of Birth: 1950/12/23   Medicare Important Message Given:  Yes - Important Message mailed due to current National Emergency  Verbal consent obtained due to current National Emergency  Relationship to patient: Self Contact Name: Moishy Laday Call Date: 11/09/19  Time: 65 Phone: [phone] Outcome: No Answer/Busy Important Message mailed to: Patient address on file    Delorse Lek 11/09/2019, 10:19 AM

## 2019-11-09 NOTE — Progress Notes (Signed)
PROGRESS NOTE    Bryan Robertson  NFA:[id number] DOB: 10/31/1950 DOA: 11/04/2019 PCP: Assunta Curtis, FNP   Brief Narrative:  Bryan Robertson is a fairly unpleasant 69 y.o. 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  Patient states during his hospitalization at [facility] he was treated with a steroid and felt better afterwards.  Worsening weakness in his right leg and numbness in his right hand x1 week now resolving numbness - using walker to ambulate safely.  Denies any changes in vision or difficulty with his speech.  He has been vaccinated against Covid.  Denies cough, shortness of breath, nausea, vomiting, abdominal pain, or diarrhea.   Assessment & Plan:   Principal Problem:   Right leg weakness Active Problems:   Essential hypertension   Hyperlipidemia   BPH (benign prostatic hyperplasia)  Acute on chronic ambulatory dysfunction with recurrent falls in the setting of right leg weakness, right hand numbness, POA - Previous MRI brain and C-spine with findings concerning for a demyelinating disease. - Neurology following, indicates his presentation was felt to be atypical for MS.   - His presentation was felt to be either due to vasculitis or atypical infection or a paraneoplastic process.   - Infectious disease asked to evaluate given his complex history and previous testing, ID recommends to stop antibiotics given no current infectious process ongoing and previous testing outpatient ID was essentially resolved/negative. - Repeat MRI head, CT chest abdomen pelvis as below, no overt infectious process ongoing, small 2.7 x 2.6 cm hypodensity of the gastric fundus, follow screening images per protocol with PCP in the next 3 to 6 months.  Hypertension, essential -Continue home medications; borderline controlled, patient  requesting to follow with PCP for blood pressure medication changes which is certainly reasonable, systolic blood pressure ranging 120s to 140 at this point.  Hyperlipidemia -Continue home medications  BPH, with lower urinary tract syndrome -Patient reports stopping his home Flomax, when asked why he reports "because I wanted to" -Resume Flomax, patient required in and out catheter at admission with greater than 500 cc of urine - foley placed given ongoing symptoms   DVT prophylaxis: Lovenox Code Status: Full code Family Communication:  Updated daughter over the phone  Status is: Inpatient  Dispo: The patient is from: Home              Anticipated d/c is to: Likely home              Anticipated d/c date is: 24-48 hours pending clinical course              Patient currently not medically stable for discharge given ongoing need for further evaluation and IV steroids  Consultants:   Neurology, infectious disease  Procedures:   None planned  Antimicrobials:  Rifampin -11/04/2019-11/05/2019  Subjective: No acute issues or events overnight, denies nausea, vomiting, diarrhea, constipation, headache, fevers, chills.  Patient indicates his right lower extremity is markedly improved from previous - indicates his left leg now feels weak  Objective: Vitals:   11/08/19 2326 11/09/19 0605 11/09/19 0915 11/09/19 1212  BP: (!) 143/69 (!) 145/72  (!) 156/80  Pulse: (!) 58 71 85 63  Resp: 14 16  17   Temp: 97.6 F (36.4 C) 97.6 F (36.4 C)  98.4 F (36.9 C)  TempSrc:  Oral  Oral  SpO2: 95% 95%  95%  Weight:      Height:       No intake or output data in the 24 hours ending 11/09/19 1552 Filed Weights   11/05/19 2040  Weight: 90.3 kg    Examination:  General:  Pleasantly resting in bed, No acute distress. HEENT:  Normocephalic atraumatic.  Sclerae nonicteric, noninjected.  Extraocular movements intact bilaterally. Neck:  Without mass or deformity.  Trachea is midline. Lungs:   Clear to auscultate bilaterally without rhonchi, wheeze, or rales. Heart:  Regular rate and rhythm.  Without murmurs, rubs, or gallops. Abdomen:  Soft, nontender, nondistended.  Without guarding or rebound. Extremities: Without cyanosis, clubbing, edema, or obvious deformity. Vascular:  Dorsalis pedis and posterior tibial pulses palpable bilaterally. Skin:  Warm and dry, no erythema, no ulcerations.   Data Reviewed: I have personally reviewed following labs and imaging studies  CBC: Recent Labs  Lab 11/04/19 1436 11/06/19 0735 11/07/19 0307 11/08/19 0255 11/09/19 0506  WBC 7.1 6.2 12.1* 17.1* 16.4*  NEUTROABS 4.0  --   --   --   --   HGB 16.6 15.3 15.9 16.0 15.3  HCT 50.9 46.7 48.1 47.0 46.4  MCV 88.8 88.3 87.0 88.2 88.4  PLT 194 236 248 241 419   Basic Metabolic Panel: Recent Labs  Lab 11/04/19 1436 11/06/19 0735 11/07/19 0307 11/08/19 0255 11/09/19 0506  NA 141 140 139 140 142  K 4.9 3.3* 3.7 3.8 3.7  CL 104 103 104 104 105  CO2 27 28 24 26 25   GLUCOSE 89 134* 136* 115* 118*  BUN 15 14 15 20 22   CREATININE 1.14 1.16 1.04 1.06 1.24  CALCIUM 9.8 9.2 9.4 9.1 8.9   GFR: Estimated Creatinine Clearance: 62.6 mL/min (by C-G formula based on SCr of 1.24 mg/dL). Liver Function Tests: Recent Labs  Lab 11/06/19 0735 11/07/19 0307 11/08/19 0255 11/09/19 0506  AST 18 18 15 16   ALT 13 15 14 15   ALKPHOS 47 48 51 52  BILITOT 1.2 0.9 0.6 0.6  PROT 6.6 6.5 6.3* 6.2*  ALBUMIN 3.6 3.6 3.5 3.5   No results for input(s): LIPASE, AMYLASE in the last 168 hours. No results for input(s): AMMONIA in the last 168 hours. Coagulation Profile: No results for input(s): INR, PROTIME in the last 168 hours. Cardiac Enzymes: No results for input(s): CKTOTAL, CKMB, CKMBINDEX, TROPONINI in the last 168 hours. BNP (last 3 results) No results for input(s): PROBNP in the last 8760 hours. HbA1C: No results for input(s): HGBA1C in the last 72 hours. CBG: No results for input(s): GLUCAP  in the last 168 hours. Lipid Profile: No results for input(s): CHOL, HDL, LDLCALC, TRIG, CHOLHDL, LDLDIRECT in the last 72 hours. Thyroid Function Tests: No results for input(s): TSH, T4TOTAL, FREET4, T3FREE, THYROIDAB in the last 72 hours. Anemia Panel: No results for input(s): VITAMINB12, FOLATE, FERRITIN, TIBC, IRON, RETICCTPCT in the last 72 hours. Sepsis Labs: No results for input(s): PROCALCITON, LATICACIDVEN in the last 168 hours.  Recent Results (from the past 240 hour(s))  Respiratory Panel by RT PCR (Flu A&B, Covid) - Vein     Status: None   Collection Time: 11/04/19 11:55 PM   Specimen: Vein; Nasopharyngeal  Result Value Ref Range Status   SARS Coronavirus 2 by RT PCR NEGATIVE NEGATIVE Final    Comment: (NOTE) SARS-CoV-2 target nucleic acids are NOT DETECTED.  The SARS-CoV-2 RNA is generally detectable in upper respiratoy specimens during the acute phase of infection. The lowest concentration of SARS-CoV-2 viral copies this assay can  detect is 131 copies/mL. A negative result does not preclude SARS-Cov-2 infection and should not be used as the sole basis for treatment or other patient management decisions. A negative result may occur with  improper specimen collection/handling, submission of specimen other than nasopharyngeal swab, presence of viral mutation(s) within the areas targeted by this assay, and inadequate number of viral copies (<131 copies/mL). A negative result must be combined with clinical observations, patient history, and epidemiological information. The expected result is Negative.  Fact Sheet for Patients:  PinkCheek.be  Fact Sheet for Healthcare Providers:  GravelBags.it  This test is no t yet approved or cleared by the [place] FDA and  has been authorized for detection and/or diagnosis of SARS-CoV-2 by FDA under an Emergency Use Authorization (EUA). This EUA will remain  in effect  (meaning this test can be used) for the duration of the COVID-19 declaration under Section 564(b)(1) of the Act, 21 U.S.C. section 360bbb-3(b)(1), unless the authorization is terminated or revoked sooner.     Influenza A by PCR NEGATIVE NEGATIVE Final   Influenza B by PCR NEGATIVE NEGATIVE Final    Comment: (NOTE) The Xpert Xpress SARS-CoV-2/FLU/RSV assay is intended as an aid in  the diagnosis of influenza from Nasopharyngeal swab specimens and  should not be used as a sole basis for treatment. Nasal washings and  aspirates are unacceptable for Xpert Xpress SARS-CoV-2/FLU/RSV  testing.  Fact Sheet for Patients: PinkCheek.be  Fact Sheet for Healthcare Providers: GravelBags.it  This test is not yet approved or cleared by the [place] FDA and  has been authorized for detection and/or diagnosis of SARS-CoV-2 by  FDA under an Emergency Use Authorization (EUA). This EUA will remain  in effect (meaning this test can be used) for the duration of the  Covid-19 declaration under Section 564(b)(1) of the Act, 21  U.S.C. section 360bbb-3(b)(1), unless the authorization is  terminated or revoked. Performed at [facility], [place] [address] [place], [place] [address]          Radiology Studies: No results found. Scheduled Meds: . aspirin  325 mg Oral QHS  . atorvastatin  20 mg Oral QHS  . brimonidine  1 drop Both Eyes TID  . Chlorhexidine Gluconate Cloth  6 each Topical Daily  . dorzolamide-timolol  1 drop Both Eyes BID  . enoxaparin (LOVENOX) injection  40 mg Subcutaneous Q24H  . lisinopril  10 mg Oral Daily  . metoprolol succinate  25 mg Oral Daily  . Netarsudil Dimesylate  1 drop Both Eyes QHS  . pantoprazole  40 mg Oral Q1200  . tamsulosin  0.8 mg Oral QHS   Continuous Infusions: . methylPREDNISolone (SOLU-MEDROL) injection 1,000 mg (11/09/19 0917)     LOS: 5 days   Time spent: 28min  Jayquon Theiler C  Heavenlee Maiorana, DO [facility]  If 7PM-7AM, please contact night-coverage [email or web address]  11/09/2019, 3:52 PM

## 2019-11-10 LAB — CBC
HCT: 45.3 % (ref 39.0–52.0)
Hemoglobin: 15 g/dL (ref 13.0–17.0)
MCH: 29.1 pg (ref 26.0–34.0)
MCHC: 33.1 g/dL (ref 30.0–36.0)
MCV: 87.8 fL (ref 80.0–100.0)
Platelets: 234 10*3/uL (ref 150–400)
RBC: 5.16 MIL/uL (ref 4.22–5.81)
RDW: 12.1 % (ref 11.5–15.5)
WBC: 16.1 10*3/uL — ABNORMAL HIGH (ref 4.0–10.5)
nRBC: 0 % (ref 0.0–0.2)

## 2019-11-10 LAB — COMPREHENSIVE METABOLIC PANEL
ALT: 15 U/L (ref 0–44)
AST: 14 U/L — ABNORMAL LOW (ref 15–41)
Albumin: 3.2 g/dL — ABNORMAL LOW (ref 3.5–5.0)
Alkaline Phosphatase: 56 U/L (ref 38–126)
Anion gap: 7 (ref 5–15)
BUN: 18 mg/dL (ref 8–23)
CO2: 28 mmol/L (ref 22–32)
Calcium: 8.9 mg/dL (ref 8.9–10.3)
Chloride: 105 mmol/L (ref 98–111)
Creatinine, Ser: 1.06 mg/dL (ref 0.61–1.24)
GFR calc non Af Amer: >60 mL/min (ref >60)
Glucose, Bld: 136 mg/dL — ABNORMAL HIGH (ref 70–99)
Potassium: 3.7 mmol/L (ref 3.5–5.1)
Sodium: 140 mmol/L (ref 135–145)
Total Bilirubin: 0.5 mg/dL (ref 0.3–1.2)
Total Protein: 5.9 g/dL — ABNORMAL LOW (ref 6.5–8.1)

## 2019-11-10 NOTE — Progress Notes (Signed)
Occupational Therapy Treatment Patient Details Name: Bryan Robertson MRN: [id number] DOB: 1950-04-23 Today's Date: 11/10/2019    History of present illness Bryan Robertson is a 69 y.o. 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 No clear enhancement in these regions to suggest active demyelination. 2. 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   OT comments  Patient progressing well towards OT goals.  Patient completing bed mobility with min guard, transfers with min guard assist today given cueing to recall hand placement and safety with RW mgmt.  Patient stood at sink to complete grooming tasks with min guard, cueing for posture and noticeable fatigue with sustained standing tasks.  Completed functional mobility in room with min guard using RW for safety. Continue to recommend 24/7 assist at dc, [facility] services to optimize safety and return to PLOF.  Will follow.   Follow Up Recommendations  Home health OT;Supervision/Assistance - 24 hour    Equipment Recommendations  3 in 1 bedside commode    Recommendations for Other Services      Precautions / Restrictions Precautions Precautions: Fall Restrictions Weight Bearing Restrictions: No       Mobility Bed Mobility Overal bed mobility: Needs Assistance Bed Mobility: Supine to Sit     Supine to sit: HOB elevated;Min guard     General bed mobility comments: min guard for safety, pt demonstrating good technique and improved trunk control/strength today with no physical assist required  Transfers Overall transfer level: Needs assistance Equipment used: Rolling walker (2  wheeled) Transfers: Sit to/from Stand Sit to Stand: Min guard         General transfer comment: min guard to for safety and balance given increased time to power up from EOB, cueing for hand placement and to avoid pulling on RW     Balance Overall balance assessment: Needs assistance Sitting-balance support: No upper extremity supported;Feet supported;Single extremity supported Sitting balance-Leahy Scale: Fair Sitting balance - Comments: supervision for safety, statically and dynamically today    Standing balance support: Bilateral upper extremity supported;During functional activity;No upper extremity supported Standing balance-Leahy Scale: Poor Standing balance comment: relies on UE support dynamically, able to engage in basic ADLs with 0-1 hand support but preference to UE support                            ADL either performed or assessed with clinical judgement   ADL Overall ADL's : Needs assistance/impaired     Grooming: Min guard;Standing;Wash/dry hands;Wash/dry face;Oral care Grooming Details (indicate cue type and reason): cueing for posture, stood at sink to complete all tasks with min guard                  Toilet Transfer: Min guard;Ambulation;RW Toilet Transfer Details (indicate cue type and reason): simulated in room to recliner, min cueing for safety         Functional mobility during ADLs: Min guard;Rolling walker;Cueing for safety       Vision       Perception     Praxis      Cognition Arousal/Alertness: Awake/alert Behavior During Therapy: WFL for tasks assessed/performed Overall Cognitive Status:  Impaired/Different from baseline Area of Impairment: Awareness;Problem solving;Safety/judgement                         Safety/Judgement: Decreased awareness of safety;Decreased awareness of deficits Awareness: Emergent Problem Solving: Slow processing;Difficulty sequencing;Requires verbal cues General Comments: patient  continues to require cueing for safety during session, but awareness improving; continue to recommend 24/7 support        Exercises     Shoulder Instructions       General Comments VSS     Pertinent Vitals/ Pain       Pain Assessment: No/denies pain  Home Living                                          Prior Functioning/Environment              Frequency  Min 2X/week        Progress Toward Goals  OT Goals(current goals can now be found in the care plan section)  Progress towards OT goals: Progressing toward goals  Acute Rehab OT Goals Patient Stated Goal: to go home and do therapy there OT Goal Formulation: With patient  Plan Discharge plan remains appropriate;Frequency remains appropriate    Co-evaluation                 AM-PAC OT "6 Clicks" Daily Activity     Outcome Measure   Help from another person eating meals?: None Help from another person taking care of personal grooming?: A Little Help from another person toileting, which includes using toliet, bedpan, or urinal?: A Lot Help from another person bathing (including washing, rinsing, drying)?: A Little Help from another person to put on and taking off regular upper body clothing?: A Little Help from another person to put on and taking off regular lower body clothing?: A Lot 6 Click Score: 17    End of Session Equipment Utilized During Treatment: Gait belt;Rolling walker  OT Visit Diagnosis: Other abnormalities of gait and mobility (R26.89);Muscle weakness (generalized) (M62.81);Other symptoms and signs involving cognitive function   Activity Tolerance Patient tolerated treatment well   Patient Left in chair;with call bell/phone within reach;with chair alarm set;with nursing/sitter in room   Nurse Communication Mobility status        Time: 0940-1005 OT Time Calculation (min): 25 min  Charges: OT General Charges $OT Visit: 1 Visit OT Treatments $Self Care/Home  Management : 23-37 mins  [place] Pager [phone] Office [phone]    Delight Stare 11/10/2019, 10:36 AM

## 2019-11-10 NOTE — Progress Notes (Addendum)
NEUROLOGY PROGRESS NOTE  Subjective: Feels that his strength continues to improve and that he is near his baseline.   Exam: Vitals:   11/10/19 0041 11/10/19 0631  BP: (!) 154/80 (!) 160/84  Pulse: 61 70  Resp: 18 18  Temp: 98 F (36.7 C) 98.5 F (36.9 C)  SpO2: 97% 95%    Neuro:  Mental Status: Alert, oriented, thought content appropriate, pleasant.  Speech fluent without evidence of aphasia.  Able to follow 3 step commands without difficulty. Cranial Nerves: II:  Visual fields normal III,IV, VI: No ptosis, EOM intact, PERRLA, + left and right beating horizontal nystagmus  V,VII: smile symmetric, sensation intact bilaterally   XIII: hearing intact XI: normal shoulder shrug XII: midline tongue extension Motor:  Bilateral upper extremity grip, finger separation, elbow flexion/extension, arm flexion/abduction strength 5/5 Bilateral lower extremity leg flexion/extension strength 5/5, knee flexion/extension 5/5, plantar/dorsiflexion 5/5 Tone and bulk:normal tone throughout; no atrophy noted Sensory: Pinprick and light touch intact throughout, bilaterally Deep Tendon Reflexes: brachioradialis 2+, right patellar 2+, left patellar 1+   Medications:  Scheduled: . aspirin  325 mg Oral QHS  . atorvastatin  20 mg Oral QHS  . brimonidine  1 drop Both Eyes TID  . Chlorhexidine Gluconate Cloth  6 each Topical Daily  . dorzolamide-timolol  1 drop Both Eyes BID  . enoxaparin (LOVENOX) injection  40 mg Subcutaneous Q24H  . lisinopril  10 mg Oral Daily  . metoprolol succinate  25 mg Oral Daily  . Netarsudil Dimesylate  1 drop Both Eyes QHS  . pantoprazole  40 mg Oral Q1200  . tamsulosin  0.8 mg Oral QHS    Pertinent Labs/Diagnostics: MR Brain W and Wo Contrast: There are numerous T2/FLAIR hyperintense infratentorial and supratentorial lesions, which are detailed above and compatible with demyelinating disease. A few of the periventricular lesions restrict diffusion and peripherally  enhance, compatible with active demyelination.  MR Cervical Spine W or Wo Contrast 1. Focal short-segment T2 hyperintensity within the dorsolateral right cord at C2-C3 and C3-C4, which is compatible with demyelinating disease given findings on concurrent MRI head. No clear enhancement in these regions to suggest active demyelination. 2. 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   Assessment: 69 y.o. male with PMH significant for CNS Demyelination on MRI, recently treated with IV steroids at [facility], OSH, HTN and HLD who presents with reported R sided weakness but weak on the left on exam. His neurologic examination was initially notable for Left hand grip weakness and Left hip flexion weakness.  1. 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 MRI C spine reveals cord lesions with morphologies and locations suggestive of MS.  2. However, there are several atypical features of his overall presentation. He is an above 52 male and the incidence of new onset MS is very low in this population. He has elevated CSF protein which can be seen on MS relapses but it is not typically this high and there is slight asymmetric smooth enhancement of the left facial nerve which is also atypical for multiple sclerosis. The enhancement in some of the lesions is heterogenous, which is possible in MS, especially with subacute lesions. 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  He was treated in the past with amoxicillin.  They do not feel that he needs any further antibiotics at this time. They agree with administering methylprednisolone. 3. A vasculitis is on the DDx, but the  locations, morphologies and signal characteristics of the lesions would be quite atypical for this.  4. A slowly progressive cerebral infection such as can be seen with Lyme disease, ehrlichiosis or RMSF has been considered.  However, the imaging findings would be atypical and ID also does not feel that his presentation is due to infection (see #2 above).  5. The lesion distribution and discrete morphologies would be highly atypical for a paraneoplastic process.   Recommendations: 1. Now completed 5 days 1000 mg methylprednisolone with resolution of weakness  2. He has follow-up with his neurologist at [facility] next week.  3. Neurohospitalist service will sign off. Please call if there are additional questions.    Marty Heck, DO 11/10/2019, 11:20 AM Pager: [phone]   Electronically signed: Dr. Kerney Elbe

## 2019-11-10 NOTE — Discharge Instructions (Signed)
Deconditioning °Deconditioning refers to the changes in the body that occur during a period of inactivity. The changes happen in the heart, lungs, and muscles. They make you feel tired and weak (fatigued) and decrease your ability to be active. The three stages of deconditioning include: °· Mild deconditioning. This is a change in your ability to do your usual exercise activities, such as running, biking, or swimming. °· Moderate deconditioning. This is a change in your ability to do normal everyday activities, such as walking, shopping for groceries, and doing chores. °· Severe deconditioning. In this stage, you may not be able to do minimal activity or usual self-care. °What are the causes? °Deconditioning can occur after only a few days of inactivity. The longer the period of inactivity, the more severe the deconditioning will be, and the longer it will take to return to your previous level of functioning. Deconditioning is often caused by inactivity due to: °· Illnesses, such as cancer, stroke, heart attack, fibromyalgia, and chronic fatigue syndrome. °· Injuries, especially back injuries, broken bones, and injuries to soft tissues, such as ligaments and tendons. °· A long stay in the hospital. °· Pregnancy, especially if long periods of bed rest are needed. °What increases the risk? °The following factors may make you more likely to develop this condition: °· Staying in the hospital or being on bed rest. °· Obesity. °· Poor nutrition. °· Being an older adult. °· Having an injury or illness that affects your movement and activity. °What are the signs or symptoms? °Symptoms of this condition include: °· Weakness and tiredness. °· Shortness of breath with minor physical effort (exertion). °· A heartbeat that is faster than normal. You may not notice this without taking your pulse. °· Pain or discomfort with activity. °· Decreased strength, endurance, and balance. °· Difficulty doing your usual forms of  exercise. °· Difficulty doing activities of daily living, such as grocery shopping or chores. You may also have problems walking around the house and doing basic self-care, such as getting to the bathroom, preparing meals, or doing laundry. °How is this diagnosed? °This condition is diagnosed based on your medical history and a physical exam. During the physical exam, your health care provider will check for signs of deconditioning, such as: °· Decreased size of muscles. °· Decreased strength. °· Trouble with balance. °· Shortness of breath or a heart rate that is faster than normal after minor exertion. °How is this treated? °Treatment for this condition involves an exercise program in which activity is increased slowly. Your health care provider will tell you which exercises are right for you. The exercise program will likely include: °· Aerobic exercise. This type of exercise helps improve the functioning of the heart, lungs, and muscles. °· Strength training. This type of exercise helps increase muscle size and strength. °Both of these types of exercise will improve your endurance. You may be referred to a physical therapist who can create a safe strengthening program for you to follow. °Follow these instructions at home: °Eating and drinking ° °· Eat a healthy, well-balanced diet. This includes: °? Proteins, such as lean meats and fish, to build muscles. °? Fresh fruits and vegetables. °? Carbohydrates, such as whole grains, to boost energy. °· Drink enough fluid to keep your urine pale yellow. °Activity ° °· Follow the exercise program that is recommended by your health care provider or physical therapist. °· Do not increase your exercise any faster than directed. °General instructions °· Take over-the-counter and prescription medicines only   as told by your health care provider. °· Do not use any products that contain nicotine or tobacco, such as cigarettes, e-cigarettes, and chewing tobacco. If you need help  quitting, ask your health care provider. °· Keep all follow-up visits as told by your health care provider. This is important. °Contact a health care provider if: °· You are not able to do the recommended exercise program. °· You are becoming more and more tired and weak. °· You become light-headed when rising to a sitting or standing position. °· Your level of endurance decreases after it has improved. °Get help right away if you: °· Have chest pain. °· Are very short of breath. °· Have any episodes of fainting. °Summary °· Deconditioning refers to the changes in the body that occur during a period of inactivity. °· Deconditioning happens in the heart, lungs, and muscles. The changes make you feel tired and weak and decrease your ability to be active. °· Treatment for deconditioning involves an exercise program in which activity is increased slowly. °This information is not intended to replace advice given to you by your health care provider. Make sure you discuss any questions you have with your health care provider. °Document Revised: 06/16/2018 Document Reviewed: 06/16/2018 °[employer] Patient Education © 2020 [employer] ° °

## 2019-11-10 NOTE — TOC Progression Note (Signed)
Transition of Care [facility]) - Progression Note    Patient Details  Name: Bryan Robertson MRN: [id number] Date of Birth: January 23, 1951  [facility]) CM/SW Contact  Jacalyn Lefevre Edson Snowball, RN Phone Number: 11/10/2019, 10:40 AM  Clinical Narrative:     Spoke to patient at bedside. He consented to call daughter [name] [phone] and place on speaker phone.    Discussed disposition to daughters home with home health services with [facility]. [employer] has delivered  Wheel chair to patient's room. Patient has questions regarding billing of wheel chair. NCM will call [employer] and have them call patient directly to discuss.   Patient and daughter voiced understanding and agreement   Malachy Mood with [facility] aware discharge is today.  Expected Discharge Plan: [place] Barriers to Discharge: Continued Medical Work up  Expected Discharge Plan and Services Expected Discharge Plan: [place]   Discharge Planning Services: CM Consult Post Acute Care Choice: Home Health, Durable Medical Equipment Living arrangements for the past 2 months: Single Family Home Expected Discharge Date: 11/10/19               DME Arranged: Wheelchair manual DME Agency: [employer] Date DME Agency Contacted: 11/07/19 Time DME Agency Contacted: [phone] Representative spoke with at DME Agency: [facility] Arranged: PT, OT           Social Determinants of Health (SDOH) Interventions    Readmission Risk Interventions No flowsheet data found.

## 2019-11-10 NOTE — Discharge Summary (Signed)
Physician Discharge Summary  Bryan Robertson NUU:[id number] DOB: 08/14/1950 DOA: 11/04/2019  PCP: Assunta Curtis, FNP  Admit date: 11/04/2019 Discharge date: 11/10/2019  Admitted From: Home Disposition: Home  Recommendations for Outpatient Follow-up:  1. Follow up with PCP in 1-2 weeks 2. 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 Worsening weakness in his right leg and numbness in his right hand x1 week now resolving numbness - using walker to ambulate safely. Denies any changes in vision or difficulty with his speech. He has been vaccinated against Covid. Denies cough, shortness of breath, nausea, vomiting, abdominal pain, or diarrhea.  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 infectious disease for multiple tests concerning for infectious versus genetic etiology.  At this time patient has improved drastically on 5 days of high-dose steroids after ID was consulted to confirm no ongoing infection.  At this time patient is otherwise stable and agreeable for discharge home, will discharge home with supportive care, PT home health and wheelchair given his ambulatory status.  Otherwise patient to follow closely with PCP and neurology.  We discussed that he should work on consolidating his health care given his fragmented state currently with neurology at [facility] infectious disease at [facility] and PCP locally in [place] it makes obtaining records and treating appropriately somewhat difficult on our end.  Discharge Diagnoses:  Principal Problem:   Right leg weakness Active Problems:   Essential hypertension   Hyperlipidemia   BPH (benign prostatic hyperplasia)    Discharge Instructions  Discharge Instructions    Call MD for:  extreme fatigue   Complete by: As directed    Call MD for:  persistant dizziness or light-headedness   Complete by: As directed    Diet - low sodium heart healthy   Complete by: As directed    Increase activity slowly   Complete by: As directed      Allergies as of 11/10/2019      Reactions   Doxycycline Nausea Only      Medication List    TAKE these medications   atorvastatin 20 MG tablet Commonly known as: LIPITOR Take 20 mg by mouth at bedtime.   brimonidine 0.2 % ophthalmic solution Commonly known as: ALPHAGAN 1 drop 3 (three) times daily.   [employer] Aspirin 325 MG tablet Generic drug: aspirin Take 325 mg by mouth at bedtime.   dorzolamide-timolol 22.3-6.8 MG/ML ophthalmic solution Commonly known as: COSOPT Place 1 drop into both eyes 2 (two) times daily.   lisinopril 5 MG tablet Commonly known as: ZESTRIL Take 10 mg by mouth daily.   metoprolol succinate 25 MG 24 hr tablet Commonly known as: TOPROL-XL Take 25 mg by mouth daily.   polyethylene glycol 17 g packet Commonly known as: MIRALAX / GLYCOLAX Take 17 g by mouth daily.   Rhopressa 0.02 % Soln Generic drug: Netarsudil Dimesylate Place 1 drop into both eyes at bedtime.   tamsulosin 0.4 MG Caps capsule Commonly known as:  FLOMAX Take 0.8 mg by mouth at bedtime.            Durable Medical Equipment  (From admission, onward)         Start     Ordered   11/10/19 0817  DME standard manual wheelchair with seat cushion  Once       Comments: Patient suffers from ambulatory dysfunction due to a CNS demyelinating  disease(unspecified) which impairs their ability to perform daily activities like bathing, dressing, and toileting in the home.  A walker will not resolve issue with performing activities of daily living. A wheelchair will allow patient to safely perform daily activities. Patient can safely propel the wheelchair in the home or has a caregiver who can provide assistance. Length of need Lifetime. Accessories: elevating leg rests (ELRs), wheel locks, extensions and anti-tippers.   11/10/19 0816   11/07/19 0829  For home use only DME standard manual wheelchair with seat cushion  Once       Comments: Patient suffers from Acute on chronic ambulatory dysfunction with recurrent falls in the setting of right leg weakness, right hand numbness, which impairs their ability to perform daily activities like ambulating  in the home.  A cane will not resolve issue with performing activities of daily living. A wheelchair will allow patient to safely perform daily activities. Patient can safely propel the wheelchair in the home or has a caregiver who can provide assistance. Length of need lifetime . Accessories: elevating leg rests (ELRs), wheel locks, extensions and anti-tippers.  Seat and back cushion   11/07/19 0829          Follow-up Information    [facility] up.   Contact information: [place] [id number] [phone]              Allergies  Allergen Reactions  . Doxycycline Nausea Only    Consultations: Neurology  Procedures/Studies: CT Head Wo Contrast  Result Date: 11/04/2019 CLINICAL DATA:  Right leg weakness x2 weeks EXAM: CT HEAD WITHOUT CONTRAST TECHNIQUE: Contiguous axial images were obtained from the base of the skull through the vertex without intravenous contrast. COMPARISON:  07/21/2019 FINDINGS: Brain: More conspicuous hypoattenuation in the right frontal white matter, right periventricular white matter, and a small focus in posterior  left frontal white matter suggesting subacute evolving infarcts. No acute hemorrhage, midline shift, mass, mass effect, or hydrocephalus. Vascular: No hyperdense vessel or unexpected calcification. Skull: Normal. Negative for fracture or focal lesion. Sinuses/Orbits: No acute finding. Other: None IMPRESSION: Progressive cerebral white matter changes as above. No hemorrhage or other acute finding. Electronically Signed   By: Lucrezia Europe M.D.   On: 11/04/2019 14:26   MR ANGIO HEAD WO CONTRAST  Result Date: 11/05/2019 CLINICAL DATA:  Right leg weakness and right hand numbness EXAM: MRA HEAD WITHOUT CONTRAST TECHNIQUE: Angiographic images of the Circle of Willis were obtained using MRA technique without intravenous contrast. COMPARISON:  None. FINDINGS: POSTERIOR CIRCULATION: --Vertebral arteries: Normal V4 segments. --Inferior cerebellar arteries: Normal. --Basilar artery: Normal. --Superior cerebellar arteries: Normal. --Posterior cerebral arteries: Normal. The right PCA is predominantly supplied by the posterior communicating artery. ANTERIOR CIRCULATION: --Intracranial internal carotid arteries: Normal. --Anterior cerebral arteries (ACA): Normal. Both A1 segments are present. Patent anterior communicating artery (a-comm). --Middle cerebral arteries (MCA): Normal. 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 Electronically Signed   By: Ulyses Jarred M.D.   On: 11/05/2019 00:33   MR Brain W and Wo Contrast  Result Date: 11/04/2019 CLINICAL DATA:  Myelopathy. Acute or progressive weakness. Concern for multiple sclerosis. EXAM: MRI HEAD WITHOUT AND WITH CONTRAST TECHNIQUE: Multiplanar, multiecho pulse sequences of the brain and surrounding structures were obtained without and with intravenous contrast. CONTRAST:  64m GADAVIST GADOBUTROL 1 MMOL/ML IV SOLN COMPARISON:  None. FINDINGS: Brain: No acute infarct. There are multiple round/ovoid T2/FLAIR hyperintense lesions in bilateral  frontoparietal, right temporal, and left basal ganglia white matter. Most of these lesions are periventricular in location and many of the lesions are along the callososeptal interface. One of the larger lesions along the right periventricular white matter demonstrates restricted diffusion and irregular peripheral enhancement. An additional smaller lesion in the left corona radiata demonstrates peripheral restricted diffusion and mild peripheral enhancement. A lesion in the right frontal periventricular white matter extends to the subcortical white matter. There are small lesions within the right middle cerebellar peduncle (for example see series 6 and 5, image 8). No substantial mass effect associated with these lesions. No midline shift. No acute hemorrhage. No hydrocephalus. Vascular: Flow voids are maintained at the skull base. Skull and upper cervical spine: Characterized on concurrent MRI of the cervical spine Sinuses/Orbits: Retention versus cyst versus polyp within the right frontal sinus. Scattered ethmoid air cell opacification. Left maxillary sinus and right sphenoid sinus retention cysts. No air-fluid levels. Other: No mastoid effusions IMPRESSION: There are numerous T2/FLAIR hyperintense infratentorial and supratentorial lesions, which are detailed above and compatible with demyelinating disease. A few of the periventricular lesions restrict diffusion and peripherally enhance, compatible with active demyelination. Electronically Signed   By: FMargaretha SheffieldMD   On: 11/04/2019 16:56   MR Cervical Spine W or Wo Contrast  Result Date: 11/04/2019 CLINICAL DATA:  Myelopathy, acute or progressive. Concern for multiple sclerosis. EXAM: MRI CERVICAL SPINE WITHOUT AND WITH CONTRAST TECHNIQUE: Multiplanar and multiecho pulse sequences of the cervical spine, to include the craniocervical junction and cervicothoracic junction, were obtained without and with intravenous contrast. CONTRAST:  968mGADAVIST  GADOBUTROL 1 MMOL/ML IV SOLN COMPARISON:  None. FINDINGS: Alignment: Physiologic. Vertebrae: Vertebral body heights are maintained. No focal marrow edema to suggest acute fracture, osteomyelitis, or abnormal bone lesion. Edema associated with right C5-C6 facet arthropathy. Cord: There is short segment, focal, non expansile wT2 hyperintensity within the lateral and dorsal cord at C2-C3 and C3-C4 (see series 14, image 13 and 14) Posterior Fossa, vertebral arteries, paraspinal tissues: No acute abnormality. Disc levels: C2-C3: No significant disc protrusion, foraminal stenosis, or canal stenosis. C3-C4: Small posterior disc osteophyte complex and left greater than right facet and uncovertebral hypertrophy. Mild left foraminal stenosis. No significant canal stenosis. C4-C5: Small posterior disc osteophyte complex and bilateral facet and uncovertebral hypertrophy. Mild canal stenosis and mild bilateral foraminal stenosis. C5-C6: Small posterior disc osteophyte complex and right greater than left facet and uncovertebral hypertrophy. Moderate right and mild left foraminal stenosis. C6-C7: Right greater than left facet and uncovertebral hypertrophy with mild bilateral foraminal stenosis. No significant canal stenosis. C7-T1: No significant canal or foraminal stenosis. Visualized upper thoracic spine: Incompletely characterized without evidence of significant canal or foraminal stenosis. IMPRESSION: 1. Focal short-segment T2 hyperintensity within the dorsolateral right cord at C2-C3 and C3-C4, which is compatible with demyelinating disease given findings on concurrent MRI head. No clear enhancement in these regions to suggest active demyelination. 2. 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 Electronically Signed   By: FrMargaretha SheffieldD   On:  11/04/2019 17:07   CT CHEST ABDOMEN PELVIS W CONTRAST  Result Date: 11/05/2019 CLINICAL DATA:   Demyelinating disorder suspected, malignancy search EXAM: CT CHEST, ABDOMEN, AND PELVIS WITH CONTRAST TECHNIQUE: Multidetector CT imaging of the chest, abdomen and pelvis was performed following the standard protocol during bolus administration of intravenous contrast. CONTRAST:  113m OMNIPAQUE IOHEXOL 300 MG/ML SOLN, additional oral enteric contrast COMPARISON:  None. FINDINGS: CT CHEST FINDINGS Cardiovascular: No significant vascular findings. Normal heart size. No pericardial effusion. Mediastinum/Nodes: No enlarged mediastinal, hilar, or axillary lymph nodes. Thyroid gland, trachea, and esophagus demonstrate no significant findings. Lungs/Pleura: Lungs are clear. No pleural effusion or pneumothorax. Musculoskeletal: No chest wall mass or suspicious bone lesions identified. CT ABDOMEN PELVIS FINDINGS Hepatobiliary: No solid liver abnormality is seen. No gallstones, gallbladder wall thickening, or biliary dilatation. Pancreas: Unremarkable. No pancreatic ductal dilatation or surrounding inflammatory changes. Spleen: Normal in size without significant abnormality. Adrenals/Urinary Tract: Adrenal glands are unremarkable. There is a large exophytic cyst of the superior pole of the right kidney measuring 12.8 cm. Kidneys are otherwise normal, without renal calculi, solid lesion, or hydronephrosis. Mild thickening of the urinary bladder. Stomach/Bowel: There is a submucosal hypodensity of the gastric fundus measuring approximately 2.7 x 2.6 cm (series 3, image 50). Appendix appears normal. No evidence of bowel wall thickening, distention, or inflammatory changes. Generally large burden of stool throughout the colon and rectum. Vascular/Lymphatic: Scattered aortic atherosclerosis. No enlarged abdominal or pelvic lymph nodes. Reproductive: Mild prostatomegaly. Urolift implants in the prostate. Other: No abdominal wall hernia or abnormality. No abdominopelvic ascites. Musculoskeletal: No acute or significant osseous  findings. IMPRESSION: 1. No definite evidence of mass or lymphadenopathy in the chest, abdomen, or pelvis. 2. There is a submucosal hypodensity of the gastric fundus measuring approximately 2.7 x 2.6 cm, of uncertain nature. Gastric malignancy difficult to exclude. Consider endoscopy to further evaluate. 3. Large benign cyst of the superior pole of the right kidney measuring approximately 12.8 cm, which may be symptomatic due to size. 4. Mild prostatomegaly. Mild thickening of the urinary bladder, likely due to chronic outlet obstruction. 5. Aortic Atherosclerosis (ICD10-I70.0). Electronically Signed   By: AEddie CandleM.D.   On: 11/05/2019 12:26     Subjective: No acute issues or events overnight, patient feels back to baseline, legs feel "stronger" per the patient and denies any nausea, vomiting, diarrhea, constipation, headache, fevers, chills.   Discharge Exam: Vitals:   11/10/19 0631 11/10/19 1205  BP: (!) 160/84 (!) 156/79  Pulse: 70 70  Resp: 18 18  Temp: 98.5 F (36.9 C) 97.9 F (36.6 C)  SpO2: 95% 98%   Vitals:   11/10/19 0017 11/10/19 0041 11/10/19 0631 11/10/19 1205  BP:  (!) 154/80 (!) 160/84 (!) 156/79  Pulse: 63 61 70 70  Resp: 18 18 18 18   Temp: 98 F (36.7 C) 98 F (36.7 C) 98.5 F (36.9 C) 97.9 F (36.6 C)  TempSrc: Oral Oral Oral Oral  SpO2: 97% 97% 95% 98%  Weight:      Height:        General: Pt is alert, awake, not in acute distress Cardiovascular: RRR, S1/S2 +, no rubs, no gallops Respiratory: CTA bilaterally, no wheezing, no rhonchi Abdominal: Soft, NT, ND, bowel sounds + Extremities: no edema, no cyanosis    The results of significant diagnostics from this hospitalization (including imaging, microbiology, ancillary and laboratory) are listed below for reference.     Microbiology: Recent Results (from the past 240 hour(s))  Respiratory Panel by  RT PCR (Flu A&B, Covid) - Vein     Status: None   Collection Time: 11/04/19 11:55 PM   Specimen: Vein;  Nasopharyngeal  Result Value Ref Range Status   SARS Coronavirus 2 by RT PCR NEGATIVE NEGATIVE Final    Comment: (NOTE) SARS-CoV-2 target nucleic acids are NOT DETECTED.  The SARS-CoV-2 RNA is generally detectable in upper respiratoy specimens during the acute phase of infection. The lowest concentration of SARS-CoV-2 viral copies this assay can detect is 131 copies/mL. A negative result does not preclude SARS-Cov-2 infection and should not be used as the sole basis for treatment or other patient management decisions. A negative result may occur with  improper specimen collection/handling, submission of specimen other than nasopharyngeal swab, presence of viral mutation(s) within the areas targeted by this assay, and inadequate number of viral copies (<131 copies/mL). A negative result must be combined with clinical observations, patient history, and epidemiological information. The expected result is Negative.  Fact Sheet for Patients:  PinkCheek.be  Fact Sheet for Healthcare Providers:  GravelBags.it  This test is no t yet approved or cleared by the [place] FDA and  has been authorized for detection and/or diagnosis of SARS-CoV-2 by FDA under an Emergency Use Authorization (EUA). This EUA will remain  in effect (meaning this test can be used) for the duration of the COVID-19 declaration under Section 564(b)(1) of the Act, 21 U.S.C. section 360bbb-3(b)(1), unless the authorization is terminated or revoked sooner.     Influenza A by PCR NEGATIVE NEGATIVE Final   Influenza B by PCR NEGATIVE NEGATIVE Final    Comment: (NOTE) The Xpert Xpress SARS-CoV-2/FLU/RSV assay is intended as an aid in  the diagnosis of influenza from Nasopharyngeal swab specimens and  should not be used as a sole basis for treatment. Nasal washings and  aspirates are unacceptable for Xpert Xpress SARS-CoV-2/FLU/RSV  testing.  Fact Sheet for  Patients: PinkCheek.be  Fact Sheet for Healthcare Providers: GravelBags.it  This test is not yet approved or cleared by the [place] FDA and  has been authorized for detection and/or diagnosis of SARS-CoV-2 by  FDA under an Emergency Use Authorization (EUA). This EUA will remain  in effect (meaning this test can be used) for the duration of the  Covid-19 declaration under Section 564(b)(1) of the Act, 21  U.S.C. section 360bbb-3(b)(1), unless the authorization is  terminated or revoked. Performed at [facility], [place] [address], [place], [place] [address]      Labs: BNP (last 3 results) No results for input(s): BNP in the last 8760 hours. Basic Metabolic Panel: Recent Labs  Lab 11/06/19 0735 11/07/19 0307 11/08/19 0255 11/09/19 0506 11/10/19 0154  NA 140 139 140 142 140  K 3.3* 3.7 3.8 3.7 3.7  CL 103 104 104 105 105  CO2 28 24 26 25 28   GLUCOSE 134* 136* 115* 118* 136*  BUN 14 15 20 22 18   CREATININE 1.16 1.04 1.06 1.24 1.06  CALCIUM 9.2 9.4 9.1 8.9 8.9   Liver Function Tests: Recent Labs  Lab 11/06/19 0735 11/07/19 0307 11/08/19 0255 11/09/19 0506 11/10/19 0154  AST 18 18 15 16  14*  ALT 13 15 14 15 15   ALKPHOS 47 48 51 52 56  BILITOT 1.2 0.9 0.6 0.6 0.5  PROT 6.6 6.5 6.3* 6.2* 5.9*  ALBUMIN 3.6 3.6 3.5 3.5 3.2*   No results for input(s): LIPASE, AMYLASE in the last 168 hours. No results for input(s): AMMONIA in the last 168 hours.  CBC: Recent Labs  Lab 11/04/19 1436 11/04/19 1436 11/06/19 0735 11/07/19 0307 11/08/19 0255 11/09/19 0506 11/10/19 0154  WBC 7.1   < > 6.2 12.1* 17.1* 16.4* 16.1*  NEUTROABS 4.0  --   --   --   --   --   --   HGB 16.6   < > 15.3 15.9 16.0 15.3 15.0  HCT 50.9   < > 46.7 48.1 47.0 46.4 45.3  MCV 88.8   < > 88.3 87.0 88.2 88.4 87.8  PLT 194   < > 236 248 241 238 234   < > = values in this interval not displayed.   Cardiac Enzymes: No results for  input(s): CKTOTAL, CKMB, CKMBINDEX, TROPONINI in the last 168 hours. BNP: Invalid input(s): POCBNP CBG: No results for input(s): GLUCAP in the last 168 hours. D-Dimer No results for input(s): DDIMER in the last 72 hours. Hgb A1c No results for input(s): HGBA1C in the last 72 hours. Lipid Profile No results for input(s): CHOL, HDL, LDLCALC, TRIG, CHOLHDL, LDLDIRECT in the last 72 hours. Thyroid function studies No results for input(s): TSH, T4TOTAL, T3FREE, THYROIDAB in the last 72 hours.  Invalid input(s): FREET3 Anemia work up No results for input(s): VITAMINB12, FOLATE, FERRITIN, TIBC, IRON, RETICCTPCT in the last 72 hours. Urinalysis    Component Value Date/Time   COLORURINE YELLOW 11/04/2019 2246   APPEARANCEUR CLEAR 11/04/2019 2246   LABSPEC 1.026 11/04/2019 2246   PHURINE 5.0 11/04/2019 2246   GLUCOSEU NEGATIVE 11/04/2019 2246   HGBUR NEGATIVE 11/04/2019 2246   BILIRUBINUR NEGATIVE 11/04/2019 2246   KETONESUR NEGATIVE 11/04/2019 2246   PROTEINUR 30 (A) 11/04/2019 2246   NITRITE NEGATIVE 11/04/2019 2246   LEUKOCYTESUR NEGATIVE 11/04/2019 2246   Sepsis Labs Invalid input(s): PROCALCITONIN,  WBC,  LACTICIDVEN Microbiology Recent Results (from the past 240 hour(s))  Respiratory Panel by RT PCR (Flu A&B, Covid) - Vein     Status: None   Collection Time: 11/04/19 11:55 PM   Specimen: Vein; Nasopharyngeal  Result Value Ref Range Status   SARS Coronavirus 2 by RT PCR NEGATIVE NEGATIVE Final    Comment: (NOTE) SARS-CoV-2 target nucleic acids are NOT DETECTED.  The SARS-CoV-2 RNA is generally detectable in upper respiratoy specimens during the acute phase of infection. The lowest concentration of SARS-CoV-2 viral copies this assay can detect is 131 copies/mL. A negative result does not preclude SARS-Cov-2 infection and should not be used as the sole basis for treatment or other patient management decisions. A negative result may occur with  improper specimen  collection/handling, submission of specimen other than nasopharyngeal swab, presence of viral mutation(s) within the areas targeted by this assay, and inadequate number of viral copies (<131 copies/mL). A negative result must be combined with clinical observations, patient history, and epidemiological information. The expected result is Negative.  Fact Sheet for Patients:  PinkCheek.be  Fact Sheet for Healthcare Providers:  GravelBags.it  This test is no t yet approved or cleared by the [place] FDA and  has been authorized for detection and/or diagnosis of SARS-CoV-2 by FDA under an Emergency Use Authorization (EUA). This EUA will remain  in effect (meaning this test can be used) for the duration of the COVID-19 declaration under Section 564(b)(1) of the Act, 21 U.S.C. section 360bbb-3(b)(1), unless the authorization is terminated or revoked sooner.     Influenza A by PCR NEGATIVE NEGATIVE Final   Influenza B by PCR NEGATIVE NEGATIVE Final    Comment: (NOTE) The Xpert Xpress SARS-CoV-2/FLU/RSV  assay is intended as an aid in  the diagnosis of influenza from Nasopharyngeal swab specimens and  should not be used as a sole basis for treatment. Nasal washings and  aspirates are unacceptable for Xpert Xpress SARS-CoV-2/FLU/RSV  testing.  Fact Sheet for Patients: PinkCheek.be  Fact Sheet for Healthcare Providers: GravelBags.it  This test is not yet approved or cleared by the [place] FDA and  has been authorized for detection and/or diagnosis of SARS-CoV-2 by  FDA under an Emergency Use Authorization (EUA). This EUA will remain  in effect (meaning this test can be used) for the duration of the  Covid-19 declaration under Section 564(b)(1) of the Act, 21  U.S.C. section 360bbb-3(b)(1), unless the authorization is  terminated or revoked. Performed at [facility], [place] [address], [place], [place] [address]      Time coordinating discharge: Over 30 minutes  SIGNED:   Little Ishikawa, DO [facility] 11/10/2019, 3:53 PM Pager   If 7PM-7AM, please contact night-coverage [email or web address]

## 2019-11-10 NOTE — Progress Notes (Signed)
Physical Therapy Treatment Patient Details Name: Bryan Robertson MRN: [id number] DOB: 04-27-1950 Today's Date: 11/10/2019    History of Present Illness Bryan Robertson is a 69 y.o. 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 No clear enhancement in these regions to suggest active demyelination. 2. 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    PT Comments    Pt was able to increase ambulation distance today and required less assist. Continue to believe pt will need 24/7 assist due to cognitive deficits. He is expected to d/c today with daughter and HHPT to follow up.    Follow Up Recommendations  Home health PT;Supervision/Assistance - 24 hour (will require 24 hr assistance for safety with mobility)     Equipment Recommendations  Wheelchair cushion (measurements PT);Wheelchair (measurements PT)    Recommendations for Other Services       Precautions / Restrictions Precautions Precautions: Fall    Mobility  Bed Mobility Overal bed mobility: Needs Assistance       Supine to sit: Supervision;HOB elevated     General bed mobility comments: supervision for safety.   Transfers Overall transfer level: Needs assistance Equipment used: Rolling walker (2 wheeled) Transfers: Sit to/from Stand Sit to Stand: Min guard         General transfer comment: min guard for safety.  Ambulation/Gait Ambulation/Gait assistance: Min guard Gait Distance (Feet): 60 Feet Assistive device: Rolling walker (2 wheeled) Gait Pattern/deviations: Step-through pattern;Trunk flexed;Shuffle;Decreased stride length Gait velocity: slowed   General Gait  Details: close min guard for safety. Pt initially with small shuffling steps and flexed trunk. Multimodal cues for postural control and increased step length. Noted that trunk flexion increased as step length increased. Believe pt may be using abdominal muscles to compensate for weakness in the LEs.   Stairs             Wheelchair Mobility    Modified Rankin (Stroke Patients Only)       Balance Overall balance assessment: Needs assistance Sitting-balance support: No upper extremity supported;Feet supported;Single extremity supported Sitting balance-Leahy Scale: Fair Sitting balance - Comments: supervision for safety, statically and dynamically today    Standing balance support: Bilateral upper extremity supported;During functional activity;No upper extremity supported Standing balance-Leahy Scale: Poor Standing balance comment: relies on UE support dynamically, able to engage in basic ADLs with 0-1 hand support but preference to UE support                             Cognition Arousal/Alertness: Awake/alert Behavior During Therapy: WFL for tasks assessed/performed Overall Cognitive Status: Impaired/Different from baseline Area of Impairment: Awareness;Problem solving;Safety/judgement                         Safety/Judgement: Decreased awareness of safety;Decreased awareness of deficits Awareness: Emergent Problem Solving: Slow processing;Difficulty sequencing;Requires verbal cues General Comments: Cueing for safety. At times making unusual comments then trying to pass it off as a joke. Continue to reccomend 24/7 support      Exercises Other Exercises Other Exercises: sit<>stand 5x     General Comments General comments (skin integrity, edema, etc.):  Daughter present for session      Pertinent Vitals/Pain Pain Assessment: No/denies pain    Home Living                      Prior Function            PT Goals (current goals can now  be found in the care plan section) Acute Rehab PT Goals Patient Stated Goal: to go home and do therapy there PT Goal Formulation: With patient/family Time For Goal Achievement: 11/20/19 Potential to Achieve Goals: Good Progress towards PT goals: Progressing toward goals    Frequency    Min 3X/week      PT Plan Current plan remains appropriate    Co-evaluation              AM-PAC PT "6 Clicks" Mobility   Outcome Measure  Help needed turning from your back to your side while in a flat bed without using bedrails?: None Help needed moving from lying on your back to sitting on the side of a flat bed without using bedrails?: None Help needed moving to and from a bed to a chair (including a wheelchair)?: A Little Help needed standing up from a chair using your arms (e.g., wheelchair or bedside chair)?: A Little Help needed to walk in hospital room?: A Little Help needed climbing 3-5 steps with a railing? : A Little 6 Click Score: 20    End of Session Equipment Utilized During Treatment: Gait belt Activity Tolerance: Patient tolerated treatment well Patient left: with call bell/phone within reach;in chair;with nursing/sitter in room;with family/visitor present Nurse Communication: Mobility status PT Visit Diagnosis: Other abnormalities of gait and mobility (R26.89);Difficulty in walking, not elsewhere classified (R26.2);Other symptoms and signs involving the nervous system (R29.898)     Time: 4540-9811 PT Time Calculation (min) (ACUTE ONLY): 17 min  Charges:  $Gait Training: 8-22 mins                     Benjiman Core, [place] Pager [phone] Acute Rehab   Allena Katz 11/10/2019, 2:24 PM

## 2019-11-12 ENCOUNTER — Emergency Department (HOSPITAL_BASED_OUTPATIENT_CLINIC_OR_DEPARTMENT_OTHER)
Admission: EM | Admit: 2019-11-12 | Discharge: 2019-11-12 | Disposition: A | Discharge status: Home / Self Care | Payer: Medicare Other | Attending: Emergency Medicine | Admitting: Emergency Medicine

## 2019-11-12 ENCOUNTER — Other Ambulatory Visit: Payer: Self-pay

## 2019-11-12 ENCOUNTER — Encounter (HOSPITAL_BASED_OUTPATIENT_CLINIC_OR_DEPARTMENT_OTHER): Payer: Self-pay | Admitting: Emergency Medicine

## 2019-11-12 DIAGNOSIS — Z79899 Other long term (current) drug therapy: Secondary | ICD-10-CM | POA: Insufficient documentation

## 2019-11-12 DIAGNOSIS — R339 Retention of urine, unspecified: Secondary | ICD-10-CM | POA: Diagnosis not present

## 2019-11-12 DIAGNOSIS — I10 Essential (primary) hypertension: Secondary | ICD-10-CM | POA: Diagnosis not present

## 2019-11-12 LAB — CBC WITH DIFFERENTIAL/PLATELET
Abs Immature Granulocytes: 0.07 10*3/uL (ref 0.00–0.07)
Basophils Absolute: 0 10*3/uL (ref 0.0–0.1)
Basophils Relative: 0 %
Eosinophils Absolute: 0 10*3/uL (ref 0.0–0.5)
Eosinophils Relative: 0 %
HCT: 48 % (ref 39.0–52.0)
Hemoglobin: 16.1 g/dL (ref 13.0–17.0)
Immature Granulocytes: 1 %
Lymphocytes Relative: 14 %
Lymphs Abs: 1.7 10*3/uL (ref 0.7–4.0)
MCH: 29.5 pg (ref 26.0–34.0)
MCHC: 33.5 g/dL (ref 30.0–36.0)
MCV: 88.1 fL (ref 80.0–100.0)
Monocytes Absolute: 0.6 10*3/uL (ref 0.1–1.0)
Monocytes Relative: 5 %
Neutro Abs: 9.6 10*3/uL — ABNORMAL HIGH (ref 1.7–7.7)
Neutrophils Relative %: 80 %
Platelets: 251 10*3/uL (ref 150–400)
RBC: 5.45 MIL/uL (ref 4.22–5.81)
RDW: 12.3 % (ref 11.5–15.5)
WBC: 12.1 10*3/uL — ABNORMAL HIGH (ref 4.0–10.5)
nRBC: 0 % (ref 0.0–0.2)

## 2019-11-12 LAB — BASIC METABOLIC PANEL
Anion gap: 12 (ref 5–15)
BUN: 25 mg/dL — ABNORMAL HIGH (ref 8–23)
CO2: 27 mmol/L (ref 22–32)
Calcium: 9 mg/dL (ref 8.9–10.3)
Chloride: 104 mmol/L (ref 98–111)
Creatinine, Ser: 1.08 mg/dL (ref 0.61–1.24)
GFR, Estimated: >60 mL/min (ref >60)
Glucose, Bld: 122 mg/dL — ABNORMAL HIGH (ref 70–99)
Potassium: 3.3 mmol/L — ABNORMAL LOW (ref 3.5–5.1)
Sodium: 143 mmol/L (ref 135–145)

## 2019-11-12 LAB — URINALYSIS, ROUTINE W REFLEX MICROSCOPIC
Bilirubin Urine: NEGATIVE
Glucose, UA: NEGATIVE mg/dL
Ketones, ur: NEGATIVE mg/dL
Leukocytes,Ua: NEGATIVE
Nitrite: NEGATIVE
Protein, ur: NEGATIVE mg/dL
Specific Gravity, Urine: 1.02 (ref 1.005–1.030)
pH: 6 (ref 5.0–8.0)

## 2019-11-12 LAB — URINALYSIS, MICROSCOPIC (REFLEX)

## 2019-11-12 MED ORDER — FOSFOMYCIN TROMETHAMINE 3 G PO PACK
3.0000 g | PACK | Freq: Once | ORAL | Status: AC
  Administered 2019-11-12: 3 g via ORAL
  Filled 2019-11-12: qty 3

## 2019-11-12 MED ORDER — POTASSIUM CHLORIDE CRYS ER 20 MEQ PO TBCR
40.0000 meq | EXTENDED_RELEASE_TABLET | Freq: Once | ORAL | Status: AC
  Administered 2019-11-12: 40 meq via ORAL
  Filled 2019-11-12: qty 2

## 2019-11-12 NOTE — ED Provider Notes (Addendum)
[facility] EMERGENCY DEPARTMENT Provider Note   CSN: [id number] Arrival date & time: 11/12/19  0257     History Chief Complaint  Patient presents with  . Urinary Retention    Bryan Robertson is a 69 y.o. male.  The history is provided by the patient.  Illness Location:  Bladder  Quality:  Retention of urine Severity:  Severe Onset quality:  Gradual Duration:  18 hours Timing:  Constant Progression:  Unchanged Chronicity:  Recurrent Context:  H/o BPH Relieved by:  Nothing  Worsened by:  Time  Ineffective treatments:  None Associated symptoms: no chest pain, no congestion, no ear pain, no fever, no nausea, no shortness of breath and no vomiting   Associated symptoms comment:  Distention  Risk factors:  BPH       Past Medical History:  Diagnosis Date  . High cholesterol   . Hypertension     Patient Active Problem List   Diagnosis Date Noted  . Right leg weakness 11/05/2019  . Essential hypertension 11/05/2019  . Hyperlipidemia 11/05/2019  . BPH (benign prostatic hyperplasia) 11/05/2019    History reviewed. No pertinent surgical history.     History reviewed. No pertinent family history.  Social History   Tobacco Use  . Smoking status: Never Smoker  . Smokeless tobacco: Never Used  Substance Use Topics  . Alcohol use: Not Currently  . Drug use: Not Currently    Home Medications Prior to Admission medications   Medication Sig Start Date End Date Taking? Authorizing Provider  atorvastatin (LIPITOR) 20 MG tablet Take 20 mg by mouth at bedtime.  10/21/19   [provider]  brimonidine (ALPHAGAN) 0.2 % ophthalmic solution 1 drop 3 (three) times daily. 09/04/19   [provider]  [facility] ASPIRIN 325 MG tablet Take 325 mg by mouth at bedtime.  10/03/19   [provider]  dorzolamide-timolol (COSOPT) 22.3-6.8 MG/ML ophthalmic solution Place 1 drop into both eyes 2 (two) times daily.  09/21/19   [provider]   lisinopril (ZESTRIL) 5 MG tablet Take 10 mg by mouth daily.  10/03/19   [provider]  metoprolol succinate (TOPROL-XL) 25 MG 24 hr tablet Take 25 mg by mouth daily. 08/26/19   [provider]  polyethylene glycol (MIRALAX / GLYCOLAX) 17 g packet Take 17 g by mouth daily.    [provider]  RHOPRESSA 0.02 % SOLN Place 1 drop into both eyes at bedtime.  08/04/19   [provider]  tamsulosin (FLOMAX) 0.4 MG CAPS capsule Take 0.8 mg by mouth at bedtime. 09/29/19   [provider]    Allergies    Doxycycline  Review of Systems   Review of Systems  Constitutional: Negative for fever.  HENT: Negative for congestion and ear pain.   Eyes: Negative for visual disturbance.  Respiratory: Negative for shortness of breath.   Cardiovascular: Negative for chest pain.  Gastrointestinal: Positive for abdominal distention. Negative for nausea and vomiting.  Genitourinary: Positive for difficulty urinating.  Musculoskeletal: Negative for arthralgias.  Skin: Negative for color change.  Neurological: Negative for dizziness.  Psychiatric/Behavioral: Negative for agitation.  All other systems reviewed and are negative.   Physical Exam Updated Vital Signs BP (!) 182/101 (BP Location: Right Arm)   Pulse 93   Temp 98.4 F (36.9 C)   Resp 18   Ht 6' (1.829 m)   Wt 94.8 kg   SpO2 98%   BMI 28.35 kg/m   Physical Exam Vitals and nursing  note reviewed.  Constitutional:      General: He is not in acute distress.    Appearance: Normal appearance.  HENT:     Head: Normocephalic and atraumatic.     Nose: Nose normal.  Eyes:     Conjunctiva/sclera: Conjunctivae normal.     Pupils: Pupils are equal, round, and reactive to light.  Cardiovascular:     Rate and Rhythm: Normal rate and regular rhythm.     Pulses: Normal pulses.     Heart sounds: Normal heart sounds.  Pulmonary:     Effort: Pulmonary effort is normal.     Breath sounds: Normal breath  sounds.  Abdominal:     General: Abdomen is flat. Bowel sounds are normal.     Palpations: Abdomen is soft.     Tenderness: There is no abdominal tenderness. There is no guarding or rebound.  Musculoskeletal:        General: Normal range of motion.     Cervical back: Normal range of motion and neck supple.  Skin:    General: Skin is warm and dry.     Capillary Refill: Capillary refill takes less than 2 seconds.  Neurological:     General: No focal deficit present.     Mental Status: He is alert.     Deep Tendon Reflexes: Reflexes normal.  Psychiatric:        Mood and Affect: Mood normal.        Behavior: Behavior normal.     ED Results / Procedures / Treatments   Labs (all labs ordered are listed, but only abnormal results are displayed) Results for orders placed or performed during the hospital encounter of 11/12/19  CBC with Differential/Platelet  Result Value Ref Range   WBC 12.1 (H) 4.0 - 10.5 K/uL   RBC 5.45 4.22 - 5.81 MIL/uL   Hemoglobin 16.1 13.0 - 17.0 g/dL   HCT 48.0 39 - 52 %   MCV 88.1 80.0 - 100.0 fL   MCH 29.5 26.0 - 34.0 pg   MCHC 33.5 30.0 - 36.0 g/dL   RDW 12.3 11.5 - 15.5 %   Platelets 251 150 - 400 K/uL   nRBC 0.0 0.0 - 0.2 %   Neutrophils Relative % 80 %   Neutro Abs 9.6 (H) 1.7 - 7.7 K/uL   Lymphocytes Relative 14 %   Lymphs Abs 1.7 0.7 - 4.0 K/uL   Monocytes Relative 5 %   Monocytes Absolute 0.6 0.1 - 1.0 K/uL   Eosinophils Relative 0 %   Eosinophils Absolute 0.0 0 - 0 K/uL   Basophils Relative 0 %   Basophils Absolute 0.0 0 - 0 K/uL   Immature Granulocytes 1 %   Abs Immature Granulocytes 0.07 0.00 - 0.07 K/uL  Urinalysis, Routine w reflex microscopic Urine, Catheterized  Result Value Ref Range   Color, Urine YELLOW YELLOW   APPearance CLEAR CLEAR   Specific Gravity, Urine 1.020 1.005 - 1.030   pH 6.0 5.0 - 8.0   Glucose, UA NEGATIVE NEGATIVE mg/dL   Hgb urine dipstick TRACE (A) NEGATIVE   Bilirubin Urine NEGATIVE NEGATIVE   Ketones, ur  NEGATIVE NEGATIVE mg/dL   Protein, ur NEGATIVE NEGATIVE mg/dL   Nitrite NEGATIVE NEGATIVE   Leukocytes,Ua NEGATIVE NEGATIVE  Urinalysis, Microscopic (reflex)  Result Value Ref Range   RBC / HPF 0-5 0 - 5 RBC/hpf   WBC, UA 0-5 0 - 5 WBC/hpf   Bacteria, UA FEW (A) NONE SEEN   Squamous Epithelial /  LPF 0-5 0 - 5   CT Head Wo Contrast  Result Date: 11/04/2019 CLINICAL DATA:  Right leg weakness x2 weeks EXAM: CT HEAD WITHOUT CONTRAST TECHNIQUE: Contiguous axial images were obtained from the base of the skull through the vertex without intravenous contrast. COMPARISON:  07/21/2019 FINDINGS: Brain: More conspicuous hypoattenuation in the right frontal white matter, right periventricular white matter, and a small focus in posterior left frontal white matter suggesting subacute evolving infarcts. No acute hemorrhage, midline shift, mass, mass effect, or hydrocephalus. Vascular: No hyperdense vessel or unexpected calcification. Skull: Normal. Negative for fracture or focal lesion. Sinuses/Orbits: No acute finding. Other: None IMPRESSION: Progressive cerebral white matter changes as above. No hemorrhage or other acute finding. Electronically Signed   By: Lucrezia Europe M.D.   On: 11/04/2019 14:26   MR ANGIO HEAD WO CONTRAST  Result Date: 11/05/2019 CLINICAL DATA:  Right leg weakness and right hand numbness EXAM: MRA HEAD WITHOUT CONTRAST TECHNIQUE: Angiographic images of the Circle of Willis were obtained using MRA technique without intravenous contrast. COMPARISON:  None. FINDINGS: POSTERIOR CIRCULATION: --Vertebral arteries: Normal V4 segments. --Inferior cerebellar arteries: Normal. --Basilar artery: Normal. --Superior cerebellar arteries: Normal. --Posterior cerebral arteries: Normal. The right PCA is predominantly supplied by the posterior communicating artery. ANTERIOR CIRCULATION: --Intracranial internal carotid arteries: Normal. --Anterior cerebral arteries (ACA): Normal. Both A1 segments are present.  Patent anterior communicating artery (a-comm). --Middle cerebral arteries (MCA): Normal. 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 Electronically Signed   By: Ulyses Jarred M.D.   On: 11/05/2019 00:33   MR Brain W and Wo Contrast  Result Date: 11/04/2019 CLINICAL DATA:  Myelopathy. Acute or progressive weakness. Concern for multiple sclerosis. EXAM: MRI HEAD WITHOUT AND WITH CONTRAST TECHNIQUE: Multiplanar, multiecho pulse sequences of the brain and surrounding structures were obtained without and with intravenous contrast. CONTRAST:  34mL GADAVIST GADOBUTROL 1 MMOL/ML IV SOLN COMPARISON:  None. FINDINGS: Brain: No acute infarct. There are multiple round/ovoid T2/FLAIR hyperintense lesions in bilateral frontoparietal, right temporal, and left basal ganglia white matter. Most of these lesions are periventricular in location and many of the lesions are along the callososeptal interface. One of the larger lesions along the right periventricular white matter demonstrates restricted diffusion and irregular peripheral enhancement. An additional smaller lesion in the left corona radiata demonstrates peripheral restricted diffusion and mild peripheral enhancement. A lesion in the right frontal periventricular white matter extends to the subcortical white matter. There are small lesions within the right middle cerebellar peduncle (for example see series 6 and 5, image 8). No substantial mass effect associated with these lesions. No midline shift. No acute hemorrhage. No hydrocephalus. Vascular: Flow voids are maintained at the skull base. Skull and upper cervical spine: Characterized on concurrent MRI of the cervical spine Sinuses/Orbits: Retention versus cyst versus polyp within the right frontal sinus. Scattered ethmoid air cell opacification. Left maxillary sinus and right sphenoid sinus retention cysts. No air-fluid levels. Other: No mastoid effusions IMPRESSION: There are numerous  T2/FLAIR hyperintense infratentorial and supratentorial lesions, which are detailed above and compatible with demyelinating disease. A few of the periventricular lesions restrict diffusion and peripherally enhance, compatible with active demyelination. Electronically Signed   By: Margaretha Sheffield MD   On: 11/04/2019 16:56   MR Cervical Spine W or Wo Contrast  Result Date: 11/04/2019 CLINICAL DATA:  Myelopathy, acute or progressive. Concern for multiple sclerosis. EXAM: MRI CERVICAL SPINE WITHOUT AND WITH CONTRAST TECHNIQUE: Multiplanar and multiecho pulse sequences of the cervical spine, to include  the craniocervical junction and cervicothoracic junction, were obtained without and with intravenous contrast. CONTRAST:  20mL GADAVIST GADOBUTROL 1 MMOL/ML IV SOLN COMPARISON:  None. FINDINGS: Alignment: Physiologic. Vertebrae: Vertebral body heights are maintained. No focal marrow edema to suggest acute fracture, osteomyelitis, or abnormal bone lesion. Edema associated with right C5-C6 facet arthropathy. Cord: There is short segment, focal, non expansile wT2 hyperintensity within the lateral and dorsal cord at C2-C3 and C3-C4 (see series 14, image 13 and 14) Posterior Fossa, vertebral arteries, paraspinal tissues: No acute abnormality. Disc levels: C2-C3: No significant disc protrusion, foraminal stenosis, or canal stenosis. C3-C4: Small posterior disc osteophyte complex and left greater than right facet and uncovertebral hypertrophy. Mild left foraminal stenosis. No significant canal stenosis. C4-C5: Small posterior disc osteophyte complex and bilateral facet and uncovertebral hypertrophy. Mild canal stenosis and mild bilateral foraminal stenosis. C5-C6: Small posterior disc osteophyte complex and right greater than left facet and uncovertebral hypertrophy. Moderate right and mild left foraminal stenosis. C6-C7: Right greater than left facet and uncovertebral hypertrophy with mild bilateral foraminal stenosis. No  significant canal stenosis. C7-T1: No significant canal or foraminal stenosis. Visualized upper thoracic spine: Incompletely characterized without evidence of significant canal or foraminal stenosis. IMPRESSION: 1. Focal short-segment T2 hyperintensity within the dorsolateral right cord at C2-C3 and C3-C4, which is compatible with demyelinating disease given findings on concurrent MRI head. No clear enhancement in these regions to suggest active demyelination. 2. 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 Electronically Signed   By: Margaretha Sheffield MD   On: 11/04/2019 17:07   CT CHEST ABDOMEN PELVIS W CONTRAST  Result Date: 11/05/2019 CLINICAL DATA:  Demyelinating disorder suspected, malignancy search EXAM: CT CHEST, ABDOMEN, AND PELVIS WITH CONTRAST TECHNIQUE: Multidetector CT imaging of the chest, abdomen and pelvis was performed following the standard protocol during bolus administration of intravenous contrast. CONTRAST:  184mL OMNIPAQUE IOHEXOL 300 MG/ML SOLN, additional oral enteric contrast COMPARISON:  None. FINDINGS: CT CHEST FINDINGS Cardiovascular: No significant vascular findings. Normal heart size. No pericardial effusion. Mediastinum/Nodes: No enlarged mediastinal, hilar, or axillary lymph nodes. Thyroid gland, trachea, and esophagus demonstrate no significant findings. Lungs/Pleura: Lungs are clear. No pleural effusion or pneumothorax. Musculoskeletal: No chest wall mass or suspicious bone lesions identified. CT ABDOMEN PELVIS FINDINGS Hepatobiliary: No solid liver abnormality is seen. No gallstones, gallbladder wall thickening, or biliary dilatation. Pancreas: Unremarkable. No pancreatic ductal dilatation or surrounding inflammatory changes. Spleen: Normal in size without significant abnormality. Adrenals/Urinary Tract: Adrenal glands are unremarkable. There is a large exophytic cyst of the superior pole of the  right kidney measuring 12.8 cm. Kidneys are otherwise normal, without renal calculi, solid lesion, or hydronephrosis. Mild thickening of the urinary bladder. Stomach/Bowel: There is a submucosal hypodensity of the gastric fundus measuring approximately 2.7 x 2.6 cm (series 3, image 50). Appendix appears normal. No evidence of bowel wall thickening, distention, or inflammatory changes. Generally large burden of stool throughout the colon and rectum. Vascular/Lymphatic: Scattered aortic atherosclerosis. No enlarged abdominal or pelvic lymph nodes. Reproductive: Mild prostatomegaly. Urolift implants in the prostate. Other: No abdominal wall hernia or abnormality. No abdominopelvic ascites. Musculoskeletal: No acute or significant osseous findings. IMPRESSION: 1. No definite evidence of mass or lymphadenopathy in the chest, abdomen, or pelvis. 2. There is a submucosal hypodensity of the gastric fundus measuring approximately 2.7 x 2.6 cm, of uncertain nature. Gastric malignancy difficult to exclude. Consider endoscopy to further evaluate. 3. Large benign cyst of the  superior pole of the right kidney measuring approximately 12.8 cm, which may be symptomatic due to size. 4. Mild prostatomegaly. Mild thickening of the urinary bladder, likely due to chronic outlet obstruction. 5. Aortic Atherosclerosis (ICD10-I70.0). Electronically Signed   By: Eddie Candle M.D.   On: 11/05/2019 12:26    Radiology No results found.  Procedures Procedures (including critical care time)  Medications Ordered in ED Medications  fosfomycin (MONUROL) packet 3 g (has no administration in time range)    ED Course  I have reviewed the triage vital signs and the nursing notes.  Pertinent labs & imaging results that were available during my care of the patient were reviewed by me and considered in my medical decision making (see chart for details).    Foley catheter placed in the ER with greater than 700 cc output.  Feels markedly  improved.  Foley to stay in for 7 days and be removed by urology post voiding trial.  Continue flomax.  Urine culture sent.  Strict return precautions given.    Bryan Robertson was evaluated in Emergency Department on 11/12/2019 for the symptoms described in the history of present illness. He was evaluated in the context of the global COVID-19 pandemic, which necessitated consideration that the patient might be at risk for infection with the SARS-CoV-2 virus that causes COVID-19. Institutional protocols and algorithms that pertain to the evaluation of patients at risk for COVID-19 are in a state of rapid change based on information released by regulatory bodies including the CDC and federal and state organizations. These policies and algorithms were followed during the patient's care in the ED.  Final Clinical Impression(s) / ED Diagnoses Return for intractable cough, coughing up blood,fevers >100.4 unrelieved by medication, shortness of breath, intractable vomiting, chest pain, shortness of breath, weakness,numbness, changes in speech, facial asymmetry,abdominal pain, passing out,Inability to tolerate liquids or food, cough, altered mental status or any concerns. No signs of systemic illness or infection. The patient is nontoxic-appearing on exam and vital signs are within normal limits.   I have reviewed the triage vital signs and the nursing notes. Pertinent labs &imaging results that were available during my care of the patient were reviewed by me and considered in my medical decision making (see chart for details).After history, exam, and medical workup I feel the patient has beenappropriately medically screened and is safe for discharge home. Pertinent diagnoses were discussed with the patient. Patient was given return precautions.     Ylianna Almanzar, MD 11/12/19 [phone]

## 2019-11-12 NOTE — ED Triage Notes (Signed)
Urinary retention onset tonight, hx of same. States hx of same. Bladder scanner shows 806ML, foley placed during triage with immediate relief.

## 2019-11-13 LAB — URINE CULTURE
Culture: NO GROWTH
Special Requests: NORMAL

## 2019-11-15 LAB — MISC LABCORP TEST (SEND OUT): Labcorp test code: 9985

## 2019-11-30 NOTE — Progress Notes (Signed)
Cardiology Office Note:    Date:  12/01/2019   ID:  Bryan Robertson, DOB [birth year], MRN [id number]  PCP:  Bryan Curtis, FNP  Cardiologist:  No primary care provider on file.  Electrophysiologist:  None   Referring MD: Bryan Curtis, FNP   Chief Complaint  Patient presents with  . Hypertension    History of Present Illness:    Bryan Robertson is a 69 y.o. male with a hx of hypertension, hyperlipidemia, multiple sclerosis who is referred by Bryan Salk, FNP for evaluation of hypertension.  He had recent hospitalization from 10/2 through 11/10/2019.  He had presented with right-sided weakness/numbness and gait difficulty.  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  He was treated with high-dose steroids with improvement.  He was seen by his PCP for lower extremity edema recently, lower extremity duplex on 10/17/2019 was negative for DVT.  Echocardiogram on 10/20/2019 showed EF 75%, severe LVH with echogenicity suggestive of infiltrative disease, normal RV size/function, moderate left atrial dilatation.  Labs on 10/31/2019 showed hemoglobin 16, platelets 243, creatinine 1.2, potassium 4.1, sodium 140, albumin 4.2, ferritin 166.  Reports had stress test done earlier this year in [place].  He denies any chest pain or dyspnea.  Does report intermittent lightheadedness but denies any syncope.  No palpitations.  He walks with a walker.  Reports intermittent lower extremity edema.  No smoking history.  Family history includes father died of MI in 29s.  Past Medical History:  Diagnosis Date  . High cholesterol   . Hypertension     No past surgical history on file.  Current Medications: Current Meds  Medication Sig  . atorvastatin (LIPITOR) 20 MG tablet Take 20 mg by mouth at bedtime.   . brimonidine (ALPHAGAN) 0.2 % ophthalmic solution 1 drop 3 (three) times daily.  . CVS ASPIRIN 325 MG tablet Take 325 mg by mouth at bedtime.   . dorzolamide-timolol (COSOPT) 22.3-6.8 MG/ML  ophthalmic solution Place 1 drop into both eyes 2 (two) times daily.   Marland Kitchen lisinopril (ZESTRIL) 5 MG tablet Take 10 mg by mouth daily.   . metoprolol succinate (TOPROL-XL) 25 MG 24 hr tablet Take 25 mg by mouth daily.  . polyethylene glycol (MIRALAX / GLYCOLAX) 17 g packet Take 17 g by mouth daily.  . potassium chloride SA (KLOR-CON) 20 MEQ tablet Take 20 mEq by mouth daily.  . RHOPRESSA 0.02 % SOLN Place 1 drop into both eyes at bedtime.   . tamsulosin (FLOMAX) 0.4 MG CAPS capsule Take 0.8 mg by mouth at bedtime.     Allergies:   Doxycycline   Social History   Socioeconomic History  . Marital status: Married    Spouse name: Not on file  . Number of children: Not on file  . Years of education: Not on file  . Highest education level: Not on file  Occupational History  . Not on file  Tobacco Use  . Smoking status: Never Smoker  . Smokeless tobacco: Never Used  Substance and Sexual Activity  . Alcohol use: Not Currently  . Drug use: Not Currently  . Sexual activity: Not on file  Other Topics Concern  . Not on file  Social History Narrative  . Not on file   Social Determinants of Health   Financial Resource Strain:   . Difficulty of Paying Living Expenses: Not on file  Food Insecurity:   . Worried About Charity fundraiser in the Last Year: Not on file  . Ran Out of  Food in the Last Year: Not on file  Transportation Needs:   . Lack of Transportation (Medical): Not on file  . Lack of Transportation (Non-Medical): Not on file  Physical Activity:   . Days of Exercise per Week: Not on file  . Minutes of Exercise per Session: Not on file  Stress:   . Feeling of Stress : Not on file  Social Connections:   . Frequency of Communication with Friends and Family: Not on file  . Frequency of Social Gatherings with Friends and Family: Not on file  . Attends Religious Services: Not on file  . Active Member of Clubs or Organizations: Not on file  . Attends Archivist  Meetings: Not on file  . Marital Status: Not on file     Family History: Father died of MI in 20s  ROS:   Please see the history of present illness.     All other systems reviewed and are negative.  EKGs/Labs/Other Studies Reviewed:    The following studies were reviewed today:   EKG:  EKG is ordered today.  The ekg ordered today demonstrates NSR, rate 97, LVH with repolarization abnormalities  Recent Labs: 11/10/2019: ALT 15 11/12/2019: BUN 25; Creatinine, Ser 1.08; Hemoglobin 16.1; Platelets 251; Potassium 3.3; Sodium 143  Recent Lipid Panel No results found for: CHOL, TRIG, HDL, CHOLHDL, VLDL, LDLCALC, LDLDIRECT  Physical Exam:    VS:  BP 138/83   Pulse 97   Ht 6' (1.829 m)   Wt 189 lb 9.6 oz (86 kg)   SpO2 97%   BMI 25.71 kg/m     Wt Readings from Last 3 Encounters:  12/01/19 189 lb 9.6 oz (86 kg)  11/12/19 209 lb (94.8 kg)  11/05/19 199 lb 1.2 oz (90.3 kg)     GEN:  in no acute distress HEENT: Normal NECK: No JVD; No carotid bruits LYMPHATICS: No lymphadenopathy CARDIAC:RRR, no murmurs, rubs, gallops RESPIRATORY:  Clear to auscultation without rales, wheezing or rhonchi  ABDOMEN: Soft, non-tender, non-distended MUSCULOSKELETAL:  No edema; No deformity  SKIN: Warm and dry NEUROLOGIC:  Alert and oriented x 3 PSYCHIATRIC:  Normal affect   ASSESSMENT:    1. LVH (left ventricular hypertrophy)   2. Essential hypertension   3. Hyperlipidemia, unspecified hyperlipidemia type    PLAN:     LVH: Echocardiogram with severe LVH and increased echogenicity concerning for possible amyloidosis.  Will evaluate further with cardiac MRI  Hypertension: On lisinopril 10 mg daily, Toprol-XL 25 mg daily.  Asked patient to check BP twice daily for next week and call with results  Hyperlipidemia: On atorvastatin 20 mg daily.  LDL 76 on 02/23/2019  Multiple sclerosis: Follows with neurology at [facility].  RTC in 3 months   Medication Adjustments/Labs and Tests  Ordered: Current medicines are reviewed at length with the patient today.  Concerns regarding medicines are outlined above.  Orders Placed This Encounter  Procedures  . MR CARDIAC MORPHOLOGY W WO CONTRAST  . EKG 12-Lead   No orders of the defined types were placed in this encounter.   Patient Instructions  Testing/Procedures: Your physician has requested that you have a cardiac MRI. Cardiac MRI uses a computer to create images of your heart as its beating, producing both still and moving pictures of your heart and major blood vessels. For further information please visit [email or web address] Please follow the instruction sheet given to you today for more information. [facility].    Must be approved my insurance prior  to scheduling procedure.   Follow-Up: At [facility], you and your health needs are our priority.  As part of our continuing mission to provide you with exceptional heart care, we have created designated Provider Care Teams.  These Care Teams include your primary Cardiologist (physician) and Advanced Practice Providers (APPs -  Physician Assistants and Nurse Practitioners) who all work together to provide you with the care you need, when you need it.  We recommend signing up for the patient portal called "MyChart".  Sign up information is provided on this After Visit Summary.  MyChart is used to connect with patients for Virtual Visits (Telemedicine).  Patients are able to view lab/test results, encounter notes, upcoming appointments, etc.  Non-urgent messages can be sent to your provider as well.   To learn more about what you can do with MyChart, go to [email or web address].    Your next appointment:   3 month(s)  The format for your next appointment:   In Person  Provider:   Oswaldo Milian, MD       Signed, Donato Heinz, MD  12/01/2019 9:55 PM    [place]

## 2019-12-01 ENCOUNTER — Encounter: Payer: Self-pay | Admitting: Cardiology

## 2019-12-01 ENCOUNTER — Ambulatory Visit: Payer: Medicare Other | Admitting: Cardiology

## 2019-12-01 ENCOUNTER — Other Ambulatory Visit: Payer: Self-pay

## 2019-12-01 VITALS — BP 138/83 | HR 97 | Ht 72.0 in | Wt 189.6 lb

## 2019-12-01 DIAGNOSIS — I10 Essential (primary) hypertension: Secondary | ICD-10-CM

## 2019-12-01 DIAGNOSIS — E785 Hyperlipidemia, unspecified: Secondary | ICD-10-CM

## 2019-12-01 DIAGNOSIS — I517 Cardiomegaly: Secondary | ICD-10-CM | POA: Diagnosis not present

## 2019-12-01 NOTE — Patient Instructions (Signed)
Testing/Procedures: Your physician has requested that you have a cardiac MRI. Cardiac MRI uses a computer to create images of your heart as its beating, producing both still and moving pictures of your heart and major blood vessels. For further information please visit [email or web address] Please follow the instruction sheet given to you today for more information. [facility].    Must be approved my insurance prior to scheduling procedure.   Follow-Up: At [facility], you and your health needs are our priority.  As part of our continuing mission to provide you with exceptional heart care, we have created designated Provider Care Teams.  These Care Teams include your primary Cardiologist (physician) and Advanced Practice Providers (APPs -  Physician Assistants and Nurse Practitioners) who all work together to provide you with the care you need, when you need it.  We recommend signing up for the patient portal called "MyChart".  Sign up information is provided on this After Visit Summary.  MyChart is used to connect with patients for Virtual Visits (Telemedicine).  Patients are able to view lab/test results, encounter notes, upcoming appointments, etc.  Non-urgent messages can be sent to your provider as well.   To learn more about what you can do with MyChart, go to [email or web address].    Your next appointment:   3 month(s)  The format for your next appointment:   In Person  Provider:   Oswaldo Milian, MD

## 2019-12-04 ENCOUNTER — Telehealth: Payer: Self-pay | Admitting: Cardiology

## 2019-12-04 NOTE — Telephone Encounter (Signed)
Faxed signed Authorization to Dr. Berniece Pap at [facility] in [place], [place]. Dr. Gardiner Rhyme needs records to review. 12/04/19 fsw

## 2019-12-05 ENCOUNTER — Encounter: Payer: Self-pay | Admitting: Cardiology

## 2019-12-05 ENCOUNTER — Telehealth: Payer: Self-pay | Admitting: Cardiology

## 2019-12-05 NOTE — Telephone Encounter (Signed)
Received records from Dr. Berniece Pap -[facility] on 12/04/19. Gave records to [place] , RN for Dr. Gardiner Rhyme . 12/05/19 fsw

## 2019-12-05 NOTE — Telephone Encounter (Signed)
Spoke with patient regarding appointment for Cardiac MRI scheduled Monday 12/25/19 at 8:00 am at [facility]---arrival time is 7:30 am---1st floor admissions office---will mail information to patient and he voiced his understanding.

## 2019-12-21 ENCOUNTER — Telehealth (HOSPITAL_COMMUNITY): Payer: Self-pay | Admitting: Emergency Medicine

## 2019-12-21 NOTE — Telephone Encounter (Signed)
Reaching out to patient to offer assistance regarding upcoming cardiac imaging study; pt verbalizes understanding of appt date/time, parking situation and where to check in, and verified current allergies; name and call back number provided for further questions should they arise Dian Minahan RN Navigator Cardiac Imaging [facility] [phone] office [phone] cell   Denies implants, denies claustro  

## 2019-12-25 ENCOUNTER — Ambulatory Visit (HOSPITAL_COMMUNITY)
Admission: RE | Admit: 2019-12-25 | Discharge: 2019-12-25 | Disposition: A | Discharge status: Home / Self Care | Payer: Medicare Other | Source: Ambulatory Visit | Attending: Cardiology | Admitting: Cardiology

## 2019-12-25 ENCOUNTER — Other Ambulatory Visit: Payer: Self-pay

## 2019-12-25 DIAGNOSIS — I517 Cardiomegaly: Secondary | ICD-10-CM

## 2019-12-25 DIAGNOSIS — E785 Hyperlipidemia, unspecified: Secondary | ICD-10-CM | POA: Diagnosis present

## 2019-12-25 MED ORDER — GADOBUTROL 1 MMOL/ML IV SOLN
10.0000 mL | Freq: Once | INTRAVENOUS | Status: AC | PRN
  Administered 2019-12-25: 10 mL via INTRAVENOUS

## 2019-12-31 NOTE — Progress Notes (Signed)
Cardiology Office Note:    Date:  01/04/2020   ID:  [name], DOB 09/07/1950, MRN [id number]  PCP:  Assunta Curtis, FNP  Cardiologist:  No primary care provider on file.  Electrophysiologist:  None   Referring MD: Assunta Curtis, FNP   Chief Complaint  Patient presents with  . Cardiomyopathy    History of Present Illness:    Bryan Robertson is a 69 y.o. male with a hx of hypertension, hyperlipidemia, multiple sclerosis who presents for follow-up.  He was referred by Luanna Salk, FNP for evaluation of hypertension on 12/01/2019.  He had recent hospitalization from 10/2 through 11/10/2019.  He had presented with right-sided weakness/numbness and gait difficulty.  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  He was treated with high-dose steroids with improvement.  He was seen by his PCP for lower extremity edema recently, lower extremity duplex on 10/17/2019 was negative for DVT.  Echocardiogram on 10/20/2019 showed EF 75%, severe LVH with echogenicity suggestive of infiltrative disease, normal RV size/function, moderate left atrial dilatation.  Labs on 10/31/2019 showed hemoglobin 16, platelets 243, creatinine 1.2, potassium 4.1, sodium 140, albumin 4.2, ferritin 166.  Reports had stress test done earlier this year in [place].  He denies any chest pain or dyspnea.  Does report intermittent lightheadedness but denies any syncope.  No palpitations.  He walks with a walker.  Reports intermittent lower extremity edema.  No smoking history.  Family history includes father died of MI in 72s.  Cardiac MRI on 12/25/2019 showed severe asymmetric LVH measuring up to 27 mm and basal anterior septum (7 mm and posterior wall), consistent with hypertrophic cardiomyopathy, RV insertion site LGE consistent with HCM, with LGE accounting for 6% total myocardial mass, normal biventricular function.  Since last clinic visit, he reports he has been doing okay.  Had near syncopal episode on Monday  when doing a voiding trial at his urologist.  Seen in the ED, thought to be vasovagal syncope.  He denies any other episodes of lightheadedness or syncope.  Denies any palpitations or lower extremity edema.  Denies any chest pain or shortness of breath    Past Medical History:  Diagnosis Date  . High cholesterol   . Hypertension     No past surgical history on file.  Current Medications: Current Meds  Medication Sig  . atorvastatin (LIPITOR) 20 MG tablet Take 20 mg by mouth at bedtime.   . brimonidine (ALPHAGAN) 0.2 % ophthalmic solution 1 drop 3 (three) times daily.  Marland Kitchen buPROPion (WELLBUTRIN SR) 150 MG 12 hr tablet   . clonazePAM (KLONOPIN) 0.5 MG tablet Take 0.5 mg by mouth daily as needed.  . CVS ASPIRIN 325 MG tablet Take 325 mg by mouth at bedtime.   . Diroximel Fumarate 231 MG CPDR Take 1 capsule by mouth twice daily for the first 7 days. Then, increase to 2 capsules by mouth twice daily thereafter.  . dorzolamide-timolol (COSOPT) 22.3-6.8 MG/ML ophthalmic solution Place 1 drop into both eyes 2 (two) times daily.   Marland Kitchen gabapentin (NEURONTIN) 300 MG capsule Take 300 mg by mouth 3 (three) times daily.  . polyethylene glycol (MIRALAX / GLYCOLAX) 17 g packet Take 17 g by mouth daily.  . potassium chloride SA (KLOR-CON) 20 MEQ tablet Take 20 mEq by mouth daily.  . predniSONE (DELTASONE) 10 MG tablet Take by mouth.  . RHOPRESSA 0.02 % SOLN Place 1 drop into both eyes at bedtime.   . tamsulosin (FLOMAX) 0.4 MG CAPS capsule Take 0.8  mg by mouth at bedtime.  . traZODone (DESYREL) 50 MG tablet   . [DISCONTINUED] lisinopril (ZESTRIL) 5 MG tablet Take 10 mg by mouth daily.   . [DISCONTINUED] metoprolol succinate (TOPROL-XL) 25 MG 24 hr tablet Take 25 mg by mouth daily.     Allergies:   Doxycycline   Social History   Socioeconomic History  . Marital status: Married    Spouse name: Not on file  . Number of children: Not on file  . Years of education: Not on file  . Highest education  level: Not on file  Occupational History  . Not on file  Tobacco Use  . Smoking status: Never Smoker  . Smokeless tobacco: Never Used  Substance and Sexual Activity  . Alcohol use: Not Currently  . Drug use: Not Currently  . Sexual activity: Not on file  Other Topics Concern  . Not on file  Social History Narrative  . Not on file   Social Determinants of Health   Financial Resource Strain:   . Difficulty of Paying Living Expenses: Not on file  Food Insecurity:   . Worried About Charity fundraiser in the Last Year: Not on file  . Ran Out of Food in the Last Year: Not on file  Transportation Needs:   . Lack of Transportation (Medical): Not on file  . Lack of Transportation (Non-Medical): Not on file  Physical Activity:   . Days of Exercise per Week: Not on file  . Minutes of Exercise per Session: Not on file  Stress:   . Feeling of Stress : Not on file  Social Connections:   . Frequency of Communication with Friends and Family: Not on file  . Frequency of Social Gatherings with Friends and Family: Not on file  . Attends Religious Services: Not on file  . Active Member of Clubs or Organizations: Not on file  . Attends Archivist Meetings: Not on file  . Marital Status: Not on file     Family History: Father died of MI in 5s  ROS:   Please see the history of present illness.     All other systems reviewed and are negative.  EKGs/Labs/Other Studies Reviewed:    The following studies were reviewed today:   EKG:  EKG is ordered today.  The ekg ordered today demonstrates NSR, rate 85, LVH with repolarization abnormalities, diffuse TWI  Recent Labs: 11/10/2019: ALT 15 01/01/2020: BUN 16; Creatinine, Ser 0.83; Hemoglobin 14.6; Platelets 207; Potassium 3.4; Sodium 142  Recent Lipid Panel No results found for: CHOL, TRIG, HDL, CHOLHDL, VLDL, LDLCALC, LDLDIRECT  Physical Exam:    VS:  BP 138/86   Pulse 85   Ht 6' (1.829 m)   Wt 181 lb 3.2 oz (82.2 kg)    SpO2 98%   BMI 24.58 kg/m     Wt Readings from Last 3 Encounters:  01/04/20 181 lb 3.2 oz (82.2 kg)  12/01/19 189 lb 9.6 oz (86 kg)  11/12/19 209 lb (94.8 kg)     GEN:  in no acute distress HEENT: Normal NECK: No JVD; No carotid bruits LYMPHATICS: No lymphadenopathy CARDIAC:RRR, no murmurs, rubs, gallops RESPIRATORY:  Clear to auscultation without rales, wheezing or rhonchi  ABDOMEN: Soft, non-tender, non-distended MUSCULOSKELETAL:  No edema; No deformity  SKIN: Warm and dry NEUROLOGIC:  Alert and oriented x 3 PSYCHIATRIC:  Normal affect   ASSESSMENT:    1. Hypertrophic cardiomyopathy ([place])   2. Near syncope   3. Hyperlipidemia, unspecified  hyperlipidemia type    PLAN:    HCM: Cardiac MRI on 12/25/2019 showed severe asymmetric LVH measuring up to 27 mm and basal anterior septum (7 mm and posterior wall), consistent with hypertrophic cardiomyopathy, RV insertion site LGE consistent with HCM, with LGE accounting for 6% total myocardial mass, normal biventricular function. -Recommend daughters be screened for HCM -Given recent near syncopal episode, which suspect was vasovagal during voiding trial, will check Zio patch x7 days to evaluate for ventricular arrhythmia -Check echocardiogram to evaluate for evidence of LVOT obstruction  Hypertension: On lisinopril 10 mg daily, Toprol-XL 25 mg daily.  Lisinopril was discontinued following hypotension from recent near syncopal episode, which was likely vagally mediated.  BP mildly elevated in clinic today.  Will switch from Toprol to carvedilol 6.25 mg twice daily.  Asked patient to check BP twice daily for next 2 weeks and call with results.  Hyperlipidemia: On atorvastatin 20 mg daily.  LDL 76 on 02/23/2019  Multiple sclerosis: Follows with neurology at [facility].  RTC in 3 months   Medication Adjustments/Labs and Tests Ordered: Current medicines are reviewed at length with the patient today.  Concerns regarding medicines are  outlined above.  Orders Placed This Encounter  Procedures  . LONG TERM MONITOR (3-14 DAYS)  . EKG 12-Lead  . ECHOCARDIOGRAM COMPLETE   Meds ordered this encounter  Medications  . DISCONTD: carvedilol (COREG) 6.25 MG tablet    Sig: Take 1 tablet (6.25 mg total) by mouth 2 (two) times daily.    Dispense:  180 tablet    Refill:  3  . DISCONTD: carvedilol (COREG) 6.25 MG tablet    Sig: Take 1 tablet (6.25 mg total) by mouth 2 (two) times daily.    Dispense:  180 tablet    Refill:  3  . carvedilol (COREG) 6.25 MG tablet    Sig: Take 1 tablet (6.25 mg total) by mouth 2 (two) times daily.    Dispense:  180 tablet    Refill:  3    Patient Instructions  Medication Instructions:  STOP METOPROLOL  STOP LISINOPRIL  START CARVEDILOL 6.25mg  (1 Tablet) twice daily  *If you need a refill on your cardiac medications before your next appointment, please call your pharmacy*  Testing/Procedures: Your physician has requested that you have an echocardiogram in 3 months . Echocardiography is a painless test that uses sound waves to create images of your heart. It provides your doctor with information about the size and shape of your heart and how well your heart's chambers and valves are working. You may receive an ultrasound enhancing agent through an IV if needed to better visualize your heart during the echo.This procedure takes approximately one hour. There are no restrictions for this procedure. This will take place at the [address].   ZIO XT- Long Term Monitor Instructions   Your physician has requested you wear your ZIO patch monitor 7 days.   This is a single patch monitor.  [employer] supplies one patch monitor per enrollment.  Additional stickers are not available.   Please do not apply patch if you will be having a Nuclear Stress Test, Echocardiogram, Cardiac CT, MRI, or Chest Xray during the time frame you would be wearing the monitor. The patch cannot be worn during these  tests.  You cannot remove and re-apply the ZIO XT patch monitor.   Your ZIO patch monitor will be sent USPS Priority mail from [facility] directly to your home address. The  monitor may also be mailed to a PO BOX if home delivery is not available.   It may take 3-5 days to receive your monitor after you have been enrolled.   Once you have received you monitor, please review enclosed instructions.  Your monitor has already been registered assigning a specific monitor serial # to you.   Applying the monitor   Shave hair from upper left chest.   Hold abrader disc by orange tab.  Rub abrader in 40 strokes over left upper chest as indicated in your monitor instructions.   Clean area with 4 enclosed alcohol pads .  Use all pads to assure are is cleaned thoroughly.  Let dry.   Apply patch as indicated in monitor instructions.  Patch will be place under collarbone on left side of chest with arrow pointing upward.   Rub patch adhesive wings for 2 minutes.Remove white label marked "1".  Remove white label marked "2".  Rub patch adhesive wings for 2 additional minutes.   While looking in a mirror, press and release button in center of patch.  A small green light will flash 3-4 times .  This will be your only indicator the monitor has been turned on.     Do not shower for the first 24 hours.  You may shower after the first 24 hours.   Press button if you feel a symptom. You will hear a small click.  Record Date, Time and Symptom in the Patient Log Book.   When you are ready to remove patch, follow instructions on last 2 pages of Patient Log Book.  Stick patch monitor onto last page of Patient Log Book.   Place Patient Log Book in [place] box.  Use locking tab on box and tape box closed securely.  The [employer] has [employer] on it.  Please place in mailbox as soon as possible.  Your physician should have your test results approximately 7 days after the monitor has been mailed back  to [facility].   Call [place] at [phone] if you have questions regarding your ZIO XT patch monitor.  Call them immediately if you see an orange light blinking on your monitor.   If your monitor falls off in less than 4 days contact our Monitor department at [phone].  If your monitor becomes loose or falls off after 4 days call [employer] at [phone] for suggestions on securing your monitor.   Follow-Up: At [facility], you and your health needs are our priority.  As part of our continuing mission to provide you with exceptional heart care, we have created designated Provider Care Teams.  These Care Teams include your primary Cardiologist (physician) and Advanced Practice Providers (APPs -  Physician Assistants and Nurse Practitioners) who all work together to provide you with the care you need, when you need it.  Your next appointment:   3 month(s) follow up after Echo  The format for your next appointment:   In Person  Provider:   Oswaldo Milian, MD  Other Instructions Dr. Gardiner Rhyme suggests your daughters being screened for Hypertrophic Cardiomyopathy with Echocardiogram.   Please check your blood pressure at home twice daily, write it down.  Call the office or send message via Mychart with the readings in 2 weeks for Dr. Gardiner Rhyme to review.      Signed, Donato Heinz, MD  01/04/2020 10:59 PM    [facility]

## 2020-01-01 ENCOUNTER — Other Ambulatory Visit: Payer: Self-pay

## 2020-01-01 ENCOUNTER — Emergency Department (HOSPITAL_COMMUNITY)
Admission: EM | Admit: 2020-01-01 | Discharge: 2020-01-01 | Disposition: A | Discharge status: Home / Self Care | Payer: Medicare Other | Attending: Emergency Medicine | Admitting: Emergency Medicine

## 2020-01-01 DIAGNOSIS — R55 Syncope and collapse: Secondary | ICD-10-CM | POA: Diagnosis not present

## 2020-01-01 DIAGNOSIS — R110 Nausea: Secondary | ICD-10-CM | POA: Diagnosis not present

## 2020-01-01 DIAGNOSIS — I10 Essential (primary) hypertension: Secondary | ICD-10-CM | POA: Insufficient documentation

## 2020-01-01 DIAGNOSIS — Z79899 Other long term (current) drug therapy: Secondary | ICD-10-CM | POA: Diagnosis not present

## 2020-01-01 LAB — CBC
HCT: 46 % (ref 39.0–52.0)
Hemoglobin: 14.6 g/dL (ref 13.0–17.0)
MCH: 29.1 pg (ref 26.0–34.0)
MCHC: 31.7 g/dL (ref 30.0–36.0)
MCV: 91.8 fL (ref 80.0–100.0)
Platelets: 207 10*3/uL (ref 150–400)
RBC: 5.01 MIL/uL (ref 4.22–5.81)
RDW: 13.6 % (ref 11.5–15.5)
WBC: 12.9 10*3/uL — ABNORMAL HIGH (ref 4.0–10.5)
nRBC: 0 % (ref 0.0–0.2)

## 2020-01-01 LAB — DIFFERENTIAL
Abs Immature Granulocytes: 0.09 10*3/uL — ABNORMAL HIGH (ref 0.00–0.07)
Basophils Absolute: 0 10*3/uL (ref 0.0–0.1)
Basophils Relative: 0 %
Eosinophils Absolute: 0.1 10*3/uL (ref 0.0–0.5)
Eosinophils Relative: 1 %
Immature Granulocytes: 1 %
Lymphocytes Relative: 16 %
Lymphs Abs: 2.1 10*3/uL (ref 0.7–4.0)
Monocytes Absolute: 0.8 10*3/uL (ref 0.1–1.0)
Monocytes Relative: 6 %
Neutro Abs: 9.8 10*3/uL — ABNORMAL HIGH (ref 1.7–7.7)
Neutrophils Relative %: 76 %

## 2020-01-01 LAB — BASIC METABOLIC PANEL
Anion gap: 10 (ref 5–15)
BUN: 16 mg/dL (ref 8–23)
CO2: 25 mmol/L (ref 22–32)
Calcium: 7.6 mg/dL — ABNORMAL LOW (ref 8.9–10.3)
Chloride: 107 mmol/L (ref 98–111)
Creatinine, Ser: 0.83 mg/dL (ref 0.61–1.24)
GFR, Estimated: >60 mL/min (ref >60)
Glucose, Bld: 113 mg/dL — ABNORMAL HIGH (ref 70–99)
Potassium: 3.4 mmol/L — ABNORMAL LOW (ref 3.5–5.1)
Sodium: 142 mmol/L (ref 135–145)

## 2020-01-01 LAB — CBG MONITORING, ED: Glucose-Capillary: 120 mg/dL — ABNORMAL HIGH (ref 70–99)

## 2020-01-01 NOTE — ED Provider Notes (Signed)
[place] EMERGENCY DEPARTMENT Provider Note   CSN: [id number] Arrival date & time: 01/01/20  1310     History Chief Complaint  Patient presents with  . Hypotension    Bryan Robertson is a 69 y.o. male.  69 yo M with a chief complaints of a near syncopal event.  The patient was at the urologist office and they were testing to see if they can remove his Foley catheter and when he was having fluid instilled into his bladder he suddenly felt unwell got sweaty felt he may vomit.  Reportedly he became very pale had a low blood pressure and then they sent him here for evaluation.  He now feels much better.  Denies cough congestion or fever denies chest pain or shortness of breath denies headache neck pain.  The history is provided by the patient.  Illness Severity:  Moderate Onset quality:  Gradual Duration:  2 days Timing:  Constant Progression:  Worsening Chronicity:  New Associated symptoms: nausea   Associated symptoms: no abdominal pain, no chest pain, no congestion, no diarrhea, no fever, no headaches, no myalgias, no rash, no shortness of breath and no vomiting        Past Medical History:  Diagnosis Date  . High cholesterol   . Hypertension     Patient Active Problem List   Diagnosis Date Noted  . Right leg weakness 11/05/2019  . Essential hypertension 11/05/2019  . Hyperlipidemia 11/05/2019  . BPH (benign prostatic hyperplasia) 11/05/2019    No past surgical history on file.     No family history on file.  Social History   Tobacco Use  . Smoking status: Never Smoker  . Smokeless tobacco: Never Used  Substance Use Topics  . Alcohol use: Not Currently  . Drug use: Not Currently    Home Medications Prior to Admission medications   Medication Sig Start Date End Date Taking? Authorizing Provider  atorvastatin (LIPITOR) 20 MG tablet Take 20 mg by mouth at bedtime.  10/21/19   [provider]  brimonidine (ALPHAGAN) 0.2 %  ophthalmic solution 1 drop 3 (three) times daily. 09/04/19   [provider]  [employer] ASPIRIN 325 MG tablet Take 325 mg by mouth at bedtime.  10/03/19   [provider]  dorzolamide-timolol (COSOPT) 22.3-6.8 MG/ML ophthalmic solution Place 1 drop into both eyes 2 (two) times daily.  09/21/19   [provider]  lisinopril (ZESTRIL) 5 MG tablet Take 10 mg by mouth daily.  10/03/19   [provider]  metoprolol succinate (TOPROL-XL) 25 MG 24 hr tablet Take 25 mg by mouth daily. 08/26/19   [provider]  polyethylene glycol (MIRALAX / GLYCOLAX) 17 g packet Take 17 g by mouth daily.    [provider]  potassium chloride SA (KLOR-CON) 20 MEQ tablet Take 20 mEq by mouth daily.    [provider]  RHOPRESSA 0.02 % SOLN Place 1 drop into both eyes at bedtime.  08/04/19   [provider]  tamsulosin (FLOMAX) 0.4 MG CAPS capsule Take 0.8 mg by mouth at bedtime. 09/29/19   [provider]    Allergies    Doxycycline  Review of Systems   Review of Systems  Constitutional: Positive for diaphoresis. Negative for chills and fever.  HENT: Negative for congestion and facial swelling.   Eyes: Negative for discharge and visual disturbance.  Respiratory: Negative for shortness of breath.   Cardiovascular: Negative for chest pain and palpitations.  Gastrointestinal: Positive for nausea.  Negative for abdominal pain, diarrhea and vomiting.  Musculoskeletal: Negative for arthralgias and myalgias.  Skin: Negative for color change and rash.  Neurological: Negative for tremors, syncope and headaches.  Psychiatric/Behavioral: Negative for confusion and dysphoric mood.    Physical Exam Updated Vital Signs BP 106/61   Pulse 62   Temp 98.1 F (36.7 C) (Oral)   Resp 16   SpO2 100%   Physical Exam Vitals and nursing note reviewed.  Constitutional:      Appearance: He is well-developed.  HENT:     Head: Normocephalic and atraumatic.   Eyes:     Pupils: Pupils are equal, round, and reactive to light.  Neck:     Vascular: No JVD.  Cardiovascular:     Rate and Rhythm: Normal rate and regular rhythm.     Heart sounds: No murmur heard.  No friction rub. No gallop.   Pulmonary:     Effort: No respiratory distress.     Breath sounds: No wheezing.  Abdominal:     General: There is no distension.     Tenderness: There is no abdominal tenderness. There is no guarding or rebound.  Musculoskeletal:        General: Normal range of motion.     Cervical back: Normal range of motion and neck supple.  Skin:    Coloration: Skin is not pale.     Findings: No rash.  Neurological:     Mental Status: He is alert and oriented to person, place, and time.  Psychiatric:        Behavior: Behavior normal.     ED Results / Procedures / Treatments   Labs (all labs ordered are listed, but only abnormal results are displayed) Labs Reviewed  BASIC METABOLIC PANEL - Abnormal; Notable for the following components:      Result Value   Potassium 3.4 (*)    Glucose, Bld 113 (*)    Calcium 7.6 (*)    All other components within normal limits  CBC - Abnormal; Notable for the following components:   WBC 12.9 (*)    All other components within normal limits  DIFFERENTIAL - Abnormal; Notable for the following components:   Neutro Abs 9.8 (*)    Abs Immature Granulocytes 0.09 (*)    All other components within normal limits  CBG MONITORING, ED - Abnormal; Notable for the following components:   Glucose-Capillary 120 (*)    All other components within normal limits  CBC WITH DIFFERENTIAL/PLATELET    EKG EKG Interpretation  Date/Time:  Monday January 01 2020 14:00:36 EST Ventricular Rate:  61 PR Interval:  136 QRS Duration: 98 QT Interval:  420 QTC Calculation: 422 R Axis:   48 Text Interpretation: Normal sinus rhythm Possible Lateral infarct , age undetermined ST & T wave abnormality, consider anterior ischemia Abnormal ECG No  old tracing to compare Confirmed by Deno Etienne [phone]) on 01/01/2020 2:40:22 PM   Radiology No results found.  Procedures Procedures (including critical care time)  Medications Ordered in ED Medications - No data to display  ED Course  I have reviewed the triage vital signs and the nursing notes.  Pertinent labs & imaging results that were available during my care of the patient were reviewed by me and considered in my medical decision making (see chart for details).    MDM Rules/Calculators/A&P  69 yo M with a cc of a near syncopal event.  Occurred at a urologist office while attempting trial to void with catheter in place.  Likely vasovagal by history.  Will obtain EKG basic labs and observe in ED.  Mild hypocalcemia.  No significant other electrolyte abnormality.  CBC without significant anemia.  ecg with t wave inversions anteriorly.  No chest pain, no sob.  D/c home.   3:16 PM:  I have discussed the diagnosis/risks/treatment options with the patient and believe the pt to be eligible for discharge home to follow-up with PCP. We also discussed returning to the ED immediately if new or worsening sx occur. We discussed the sx which are most concerning (e.g., sudden worsening pain, fever, inability to tolerate by mouth) that necessitate immediate return. Medications administered to the patient during their visit and any new prescriptions provided to the patient are listed below.  Medications given during this visit Medications - No data to display   The patient appears reasonably screen and/or stabilized for discharge and I doubt any other medical condition or other [facility] requiring further screening, evaluation, or treatment in the ED at this time prior to discharge.   Final Clinical Impression(s) / ED Diagnoses Final diagnoses:  Near syncope    Rx / DC Orders ED Discharge Orders    None       Deno Etienne, DO 01/01/20 1516

## 2020-01-01 NOTE — Discharge Instructions (Signed)
Follow up with your family doc and urologist.  Return for repeat event or if you start having chest pain or trouble breathing

## 2020-01-01 NOTE — ED Triage Notes (Signed)
Pt brought to ED via EMS from MD's office after becoming nauseas and clammy during Foley catheter insertion. Pt BP initially 60 palpated with EMS; given 420mL NS, increased to 109/60. Pt currently denies pain, n/v/h; a&ox4.  EMS v/s: 153 CBG 65 HR 98% RA

## 2020-01-04 ENCOUNTER — Ambulatory Visit: Payer: Medicare Other | Admitting: Cardiology

## 2020-01-04 ENCOUNTER — Other Ambulatory Visit: Payer: Self-pay

## 2020-01-04 ENCOUNTER — Encounter: Payer: Self-pay | Admitting: Cardiology

## 2020-01-04 ENCOUNTER — Ambulatory Visit (INDEPENDENT_AMBULATORY_CARE_PROVIDER_SITE_OTHER): Payer: Medicare Other | Admitting: Cardiology

## 2020-01-04 ENCOUNTER — Telehealth: Payer: Self-pay | Admitting: Radiology

## 2020-01-04 VITALS — BP 138/86 | HR 85 | Ht 72.0 in | Wt 181.2 lb

## 2020-01-04 DIAGNOSIS — I422 Other hypertrophic cardiomyopathy: Secondary | ICD-10-CM

## 2020-01-04 DIAGNOSIS — E785 Hyperlipidemia, unspecified: Secondary | ICD-10-CM | POA: Diagnosis not present

## 2020-01-04 DIAGNOSIS — R55 Syncope and collapse: Secondary | ICD-10-CM | POA: Diagnosis not present

## 2020-01-04 MED ORDER — CARVEDILOL 6.25 MG PO TABS: 6.2500 mg | ORAL_TABLET | Freq: Two times a day (BID) | ORAL | 3 refills | Status: DC

## 2020-01-04 NOTE — Telephone Encounter (Signed)
Enrolled patient for a 7 day Zio Monitor to be mailed to patients home.

## 2020-01-04 NOTE — Patient Instructions (Addendum)
Medication Instructions:  STOP METOPROLOL  STOP LISINOPRIL  START CARVEDILOL 6.25mg  (1 Tablet) twice daily  *If you need a refill on your cardiac medications before your next appointment, please call your pharmacy*  Testing/Procedures: Your physician has requested that you have an echocardiogram in 3 months . Echocardiography is a painless test that uses sound waves to create images of your heart. It provides your doctor with information about the size and shape of your heart and how well your heart's chambers and valves are working. You may receive an ultrasound enhancing agent through an IV if needed to better visualize your heart during the echo.This procedure takes approximately one hour. There are no restrictions for this procedure. This will take place at the [address], [address].   ZIO XT- Long Term Monitor Instructions   Your physician has requested you wear your ZIO patch monitor 7 days.   This is a single patch monitor.  [employer] supplies one patch monitor per enrollment.  Additional stickers are not available.   Please do not apply patch if you will be having a Nuclear Stress Test, Echocardiogram, Cardiac CT, MRI, or Chest Xray during the time frame you would be wearing the monitor. The patch cannot be worn during these tests.  You cannot remove and re-apply the ZIO XT patch monitor.   Your ZIO patch monitor will be sent USPS Priority mail from [facility] directly to your home address. The monitor may also be mailed to a PO BOX if home delivery is not available.   It may take 3-5 days to receive your monitor after you have been enrolled.   Once you have received you monitor, please review enclosed instructions.  Your monitor has already been registered assigning a specific monitor serial # to you.   Applying the monitor   Shave hair from upper left chest.   Hold abrader disc by orange tab.  Rub abrader in 40 strokes over left upper chest as indicated in your  monitor instructions.   Clean area with 4 enclosed alcohol pads .  Use all pads to assure are is cleaned thoroughly.  Let dry.   Apply patch as indicated in monitor instructions.  Patch will be place under collarbone on left side of chest with arrow pointing upward.   Rub patch adhesive wings for 2 minutes.Remove white label marked "1".  Remove white label marked "2".  Rub patch adhesive wings for 2 additional minutes.   While looking in a mirror, press and release button in center of patch.  A small green light will flash 3-4 times .  This will be your only indicator the monitor has been turned on.     Do not shower for the first 24 hours.  You may shower after the first 24 hours.   Press button if you feel a symptom. You will hear a small click.  Record Date, Time and Symptom in the Patient Log Book.   When you are ready to remove patch, follow instructions on last 2 pages of Patient Log Book.  Stick patch monitor onto last page of Patient Log Book.   Place Patient Log Book in [place] box.  Use locking tab on box and tape box closed securely.  The [employer] has [employer] on it.  Please place in mailbox as soon as possible.  Your physician should have your test results approximately 7 days after the monitor has been mailed back to [facility].   Call [employer] Customer Care at [phone] if you have questions regarding your ZIO XT patch monitor.  Call them immediately if you see an orange light blinking on your monitor.   If your monitor falls off in less than 4 days contact our Monitor department at [phone].  If your monitor becomes loose or falls off after 4 days call [employer] at [phone] for suggestions on securing your monitor.   Follow-Up: At [facility], you and your health needs are our priority.  As part of our continuing mission to provide you with exceptional heart care, we have created designated Provider Care Teams.  These Care Teams include  your primary Cardiologist (physician) and Advanced Practice Providers (APPs -  Physician Assistants and Nurse Practitioners) who all work together to provide you with the care you need, when you need it.  Your next appointment:   3 month(s) follow up after Echo  The format for your next appointment:   In Person  Provider:   Oswaldo Milian, MD  Other Instructions Dr. Gardiner Rhyme suggests your daughters being screened for Hypertrophic Cardiomyopathy with Echocardiogram.   Please check your blood pressure at home twice daily, write it down.  Call the office or send message via Mychart with the readings in 2 weeks for Dr. Gardiner Rhyme to review.

## 2020-01-10 ENCOUNTER — Ambulatory Visit (INDEPENDENT_AMBULATORY_CARE_PROVIDER_SITE_OTHER): Payer: Medicare Other

## 2020-01-10 DIAGNOSIS — I422 Other hypertrophic cardiomyopathy: Secondary | ICD-10-CM | POA: Diagnosis not present

## 2020-01-10 DIAGNOSIS — R55 Syncope and collapse: Secondary | ICD-10-CM

## 2020-01-25 ENCOUNTER — Telehealth: Payer: Self-pay | Admitting: Cardiology

## 2020-01-25 NOTE — Telephone Encounter (Signed)
Spoke to patient who reports he is out of town and does not have his BP log.    Yesterday BP was 110/74 HR 90, took BP while on the phone 121/74 HR 104.   Advised to bring BP log with him to appt on Tuesday and MD can review and make changes based on BP readings. Patient verbalized understanding.

## 2020-01-25 NOTE — Telephone Encounter (Signed)
Monitor shows frequent SVT.  Recommend increasing carvedilol to 12.5 mg BID.  We started coreg 6.25 mg BID at last clinic appointment and he was going to check BP for 2 weeks and call with results- can we see how his BP has been and as long as no low BP can increase coreg.

## 2020-01-25 NOTE — Telephone Encounter (Signed)
Bryan Robertson is calling to report an abnormal Zio monitor result. Please advise.

## 2020-01-25 NOTE — Telephone Encounter (Signed)
Spoke to patient-he denies any SOB, CP, palpitations, syncope, dizziness.      Appt scheduled to see Dr. Gardiner Rhyme 12/28 to discuss further.    Advised MD to review and will call back with additional recommendations.    Patient verbalized understanding.

## 2020-01-25 NOTE — Telephone Encounter (Signed)
Received call from direct transfer into triage from [facility] with [facility] Zio report for this patient completed for the time period of 12/8 - 12/15.   Vivien Rota states pt experiences 124 episodes of SVT and 2 episodes of V-tach. Will print report and show to provider.

## 2020-01-28 NOTE — Progress Notes (Addendum)
Cardiology Office Note:    Date:  01/30/2020   ID:  Bryan Robertson, DOB Jun 05, 1950, MRN [id number]  PCP:  Assunta Curtis, FNP  Cardiologist:  No primary care provider on file.  Electrophysiologist:  None   Referring MD: Assunta Curtis, FNP   Chief Complaint  Patient presents with  . Irregular Heart Beat    History of Present Illness:    Bryan Robertson is a 69 y.o. male with a hx of hypertension, hyperlipidemia, multiple sclerosis who presents for follow-up.  He was referred by Luanna Salk, FNP for evaluation of hypertension on 12/01/2019.  He had recent hospitalization from 10/2 through 11/10/2019.  He had presented with right-sided weakness/numbness and gait difficulty.  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  He was treated with high-dose steroids with improvement.  He was seen by his PCP for lower extremity edema recently, lower extremity duplex on 10/17/2019 was negative for DVT.  Echocardiogram on 10/20/2019 showed EF 75%, severe LVH with echogenicity suggestive of infiltrative disease, normal RV size/function, moderate left atrial dilatation.  Labs on 10/31/2019 showed hemoglobin 16, platelets 243, creatinine 1.2, potassium 4.1, sodium 140, albumin 4.2, ferritin 166. Exercise Myoview in [place] on 05/25/2019 showed normal perfusion, EF 55%.  He denies any chest pain or dyspnea.  Does report intermittent lightheadedness but denies any syncope.  No palpitations.  He walks with a walker.  Reports intermittent lower extremity edema.  No smoking history.  Family history includes father died of MI in 11s.  Cardiac MRI on 12/25/2019 showed severe asymmetric LVH measuring up to 27 mm and basal anterior septum (7 mm and posterior wall), consistent with hypertrophic cardiomyopathy, RV insertion site LGE consistent with HCM, with LGE accounting for 6% total myocardial mass, normal biventricular function.  He had near syncopal episode had urology appointment on 01/01/2020 while doing a voiding  trial.  He was seen in the ED, thought to be vasovagal syncope.  Zio patch x7 days was done on 01/25/2020, which showed 124 episodes of SVT, longest lasting 3 minutes, 2 episodes of NSVT with longest lasting 6 beats, occasional PVCs (1.1%).  Since last clinic visit, reports he has been doing well. States that he had 1 episode of lightheadedness while wearing his monitor, otherwise denies any further lightheadedness or syncope. Denies any chest pain, dyspnea, palpitations, or lower extremity edema.   Past Medical History:  Diagnosis Date  . High cholesterol   . Hypertension     No past surgical history on file.  Current Medications: Current Meds  Medication Sig  . atorvastatin (LIPITOR) 20 MG tablet Take 20 mg by mouth at bedtime.   . brimonidine (ALPHAGAN) 0.2 % ophthalmic solution 1 drop 3 (three) times daily.  Marland Kitchen buPROPion (WELLBUTRIN SR) 150 MG 12 hr tablet   . clonazePAM (KLONOPIN) 0.5 MG tablet Take 0.5 mg by mouth daily as needed.  . CVS ASPIRIN 325 MG tablet Take 325 mg by mouth at bedtime.   . Diroximel Fumarate 231 MG CPDR Take 1 capsule by mouth twice daily for the first 7 days. Then, increase to 2 capsules by mouth twice daily thereafter.  . dorzolamide-timolol (COSOPT) 22.3-6.8 MG/ML ophthalmic solution Place 1 drop into both eyes 2 (two) times daily.   Marland Kitchen gabapentin (NEURONTIN) 300 MG capsule Take 300 mg by mouth 3 (three) times daily.  . hydrochlorothiazide (HYDRODIURIL) 12.5 MG tablet Take by mouth.  . polyethylene glycol (MIRALAX / GLYCOLAX) 17 g packet Take 17 g by mouth daily.  . RHOPRESSA 0.02 %  SOLN Place 1 drop into both eyes at bedtime.   . tamsulosin (FLOMAX) 0.4 MG CAPS capsule Take 0.8 mg by mouth at bedtime.  . traZODone (DESYREL) 50 MG tablet   . [DISCONTINUED] carvedilol (COREG) 6.25 MG tablet Take 1 tablet (6.25 mg total) by mouth 2 (two) times daily.     Allergies:   Doxycycline   Social History   Socioeconomic History  . Marital status: Married     Spouse name: Not on file  . Number of children: Not on file  . Years of education: Not on file  . Highest education level: Not on file  Occupational History  . Not on file  Tobacco Use  . Smoking status: Never Smoker  . Smokeless tobacco: Never Used  Substance and Sexual Activity  . Alcohol use: Not Currently  . Drug use: Not Currently  . Sexual activity: Not on file  Other Topics Concern  . Not on file  Social History Narrative  . Not on file   Social Determinants of Health   Financial Resource Strain: Not on file  Food Insecurity: Not on file  Transportation Needs: Not on file  Physical Activity: Not on file  Stress: Not on file  Social Connections: Not on file     Family History: Father died of MI in 34s  ROS:   Please see the history of present illness.     All other systems reviewed and are negative.  EKGs/Labs/Other Studies Reviewed:    The following studies were reviewed today:   EKG:  EKG is ordered today.  The ekg ordered today demonstrates NSR, rate 94, LVH with repolarization abnormalities, diffuse TWI, QTC 462  Recent Labs: 11/10/2019: ALT 15 01/01/2020: BUN 16; Creatinine, Ser 0.83; Hemoglobin 14.6; Platelets 207; Potassium 3.4; Sodium 142  Recent Lipid Panel No results found for: CHOL, TRIG, HDL, CHOLHDL, VLDL, LDLCALC, LDLDIRECT  Physical Exam:    VS:  BP 120/62   Pulse 94   Ht 6' (1.829 m)   Wt 184 lb 9.6 oz (83.7 kg)   SpO2 95%   BMI 25.04 kg/m     Wt Readings from Last 3 Encounters:  01/30/20 184 lb 9.6 oz (83.7 kg)  01/04/20 181 lb 3.2 oz (82.2 kg)  12/01/19 189 lb 9.6 oz (86 kg)     GEN:  in no acute distress HEENT: Normal NECK: No JVD; No carotid bruits LYMPHATICS: No lymphadenopathy CARDIAC:RRR, no murmurs, rubs, gallops RESPIRATORY:  Clear to auscultation without rales, wheezing or rhonchi  ABDOMEN: Soft, non-tender, non-distended MUSCULOSKELETAL:  No edema; No deformity  SKIN: Warm and dry NEUROLOGIC:  Alert and oriented  x 3 PSYCHIATRIC:  Normal affect   ASSESSMENT:    1. Hypertrophic cardiomyopathy ([place])   2. SVT (supraventricular tachycardia) ([place])   3. Essential hypertension   4. Hyperlipidemia, unspecified hyperlipidemia type    PLAN:    HCM: Cardiac MRI on 12/25/2019 showed severe asymmetric LVH measuring up to 27 mm and basal anterior septum (7 mm and posterior wall), consistent with hypertrophic cardiomyopathy, RV insertion site LGE consistent with HCM, with LGE accounting for 6% total myocardial mass, normal biventricular function. -Recommend daughters be screened for HCM -Given recent near syncopal episode, which suspect was vasovagal during voiding trial, Zio patch x7 days was done: showed 2 episodes of NSVT lasting 6 beats. Given HCM with NSVT but age greater than 78, no indication for ICD at this time -Check echocardiogram to evaluate for evidence of LVOT obstruction  SVT: Zio patch  x7 days was done on 01/25/2020, which showed 124 episodes of SVT, longest lasting 3 minutes -Increase carvedilol to 12.5 mg twice daily  Hypertension: On carvedilol 6.25 mg twice daily.  Increase Coreg to 12.5 mg BID as above  Hyperlipidemia: On atorvastatin 20 mg daily.  LDL 76 on 02/23/2019  Multiple sclerosis: Follows with neurology at [facility].  RTC in 3 months   Medication Adjustments/Labs and Tests Ordered: Current medicines are reviewed at length with the patient today.  Concerns regarding medicines are outlined above.  No orders of the defined types were placed in this encounter.  Meds ordered this encounter  Medications  . DISCONTD: carvedilol (COREG) 12.5 MG tablet    Sig: Take 1 tablet (12.5 mg total) by mouth 2 (two) times daily.    Dispense:  180 tablet    Refill:  3  . carvedilol (COREG) 12.5 MG tablet    Sig: Take 1 tablet (12.5 mg total) by mouth 2 (two) times daily.    Dispense:  180 tablet    Refill:  3    Patient Instructions  Medication Instructions:  INCREASE carvedilol  (Coreg) to 12.5 mg two times daily  *If you need a refill on your cardiac medications before your next appointment, please call your pharmacy*  Follow-Up: At [facility], you and your health needs are our priority.  As part of our continuing mission to provide you with exceptional heart care, we have created designated Provider Care Teams.  These Care Teams include your primary Cardiologist (physician) and Advanced Practice Providers (APPs -  Physician Assistants and Nurse Practitioners) who all work together to provide you with the care you need, when you need it.  We recommend signing up for the patient portal called "MyChart".  Sign up information is provided on this After Visit Summary.  MyChart is used to connect with patients for Virtual Visits (Telemedicine).  Patients are able to view lab/test results, encounter notes, upcoming appointments, etc.  Non-urgent messages can be sent to your provider as well.   To learn more about what you can do with MyChart, go to [email or web address].    Your next appointment:   As scheduled with Dr. Gardiner Rhyme       Signed, Donato Heinz, MD  01/30/2020 5:57 PM    [place]

## 2020-01-30 ENCOUNTER — Ambulatory Visit (INDEPENDENT_AMBULATORY_CARE_PROVIDER_SITE_OTHER): Payer: Medicare Other | Admitting: Cardiology

## 2020-01-30 ENCOUNTER — Encounter: Payer: Self-pay | Admitting: Cardiology

## 2020-01-30 ENCOUNTER — Other Ambulatory Visit: Payer: Self-pay

## 2020-01-30 VITALS — BP 120/62 | HR 94 | Ht 72.0 in | Wt 184.6 lb

## 2020-01-30 DIAGNOSIS — I422 Other hypertrophic cardiomyopathy: Secondary | ICD-10-CM | POA: Diagnosis not present

## 2020-01-30 DIAGNOSIS — E785 Hyperlipidemia, unspecified: Secondary | ICD-10-CM | POA: Diagnosis not present

## 2020-01-30 DIAGNOSIS — I471 Supraventricular tachycardia: Secondary | ICD-10-CM | POA: Diagnosis not present

## 2020-01-30 DIAGNOSIS — I10 Essential (primary) hypertension: Secondary | ICD-10-CM | POA: Diagnosis not present

## 2020-01-30 MED ORDER — CARVEDILOL 12.5 MG PO TABS: 12.5000 mg | ORAL_TABLET | Freq: Two times a day (BID) | ORAL | 3 refills | Status: DC

## 2020-01-30 NOTE — Patient Instructions (Signed)
Medication Instructions:  INCREASE carvedilol (Coreg) to 12.5 mg two times daily  *If you need a refill on your cardiac medications before your next appointment, please call your pharmacy*  Follow-Up: At [facility], you and your health needs are our priority.  As part of our continuing mission to provide you with exceptional heart care, we have created designated Provider Care Teams.  These Care Teams include your primary Cardiologist (physician) and Advanced Practice Providers (APPs -  Physician Assistants and Nurse Practitioners) who all work together to provide you with the care you need, when you need it.  We recommend signing up for the patient portal called "MyChart".  Sign up information is provided on this After Visit Summary.  MyChart is used to connect with patients for Virtual Visits (Telemedicine).  Patients are able to view lab/test results, encounter notes, upcoming appointments, etc.  Non-urgent messages can be sent to your provider as well.   To learn more about what you can do with MyChart, go to [email or web address].    Your next appointment:   As scheduled with Dr. Bjorn Pippin

## 2020-01-31 NOTE — Addendum Note (Signed)
Addended by: Dorris Fetch on: 01/31/2020 02:09 PM   Modules accepted: Orders

## 2020-03-07 ENCOUNTER — Emergency Department (HOSPITAL_BASED_OUTPATIENT_CLINIC_OR_DEPARTMENT_OTHER)
Admission: EM | Admit: 2020-03-07 | Discharge: 2020-03-07 | Disposition: A | Discharge status: Home / Self Care | Payer: Medicare Other | Attending: Emergency Medicine | Admitting: Emergency Medicine

## 2020-03-07 ENCOUNTER — Other Ambulatory Visit: Payer: Self-pay

## 2020-03-07 ENCOUNTER — Encounter (HOSPITAL_BASED_OUTPATIENT_CLINIC_OR_DEPARTMENT_OTHER): Payer: Self-pay | Admitting: Emergency Medicine

## 2020-03-07 DIAGNOSIS — Z7982 Long term (current) use of aspirin: Secondary | ICD-10-CM | POA: Diagnosis not present

## 2020-03-07 DIAGNOSIS — T83091A Other mechanical complication of indwelling urethral catheter, initial encounter: Secondary | ICD-10-CM | POA: Insufficient documentation

## 2020-03-07 DIAGNOSIS — Z79899 Other long term (current) drug therapy: Secondary | ICD-10-CM | POA: Insufficient documentation

## 2020-03-07 DIAGNOSIS — I10 Essential (primary) hypertension: Secondary | ICD-10-CM | POA: Insufficient documentation

## 2020-03-07 DIAGNOSIS — T83098A Other mechanical complication of other indwelling urethral catheter, initial encounter: Secondary | ICD-10-CM

## 2020-03-07 DIAGNOSIS — R1030 Lower abdominal pain, unspecified: Secondary | ICD-10-CM | POA: Diagnosis not present

## 2020-03-07 NOTE — ED Triage Notes (Signed)
Pt states has had a foley for x 3 months and now he is having pain in his scrotum and leaking around the catheter  And he feel freq and urgency

## 2020-03-07 NOTE — ED Notes (Signed)
Total of 500 Ml drained from bag. Leg bag applied and instructions provided

## 2020-03-07 NOTE — ED Provider Notes (Signed)
[facility] EMERGENCY DEPARTMENT Provider Note   CSN: [id number] Arrival date & time: 03/07/20  0711     History Chief Complaint  Patient presents with  . Abdominal Pain    Bryan Robertson is a 70 y.o. male.  Sees a urologist for BPH and has a chronic foley catheter for obstruction.  Currently has this Foley cath in for the past month.  Noticed it was leaking urine around the catheter insertion site yesterday.  Has pain where catheter is inserted.  Denies abdominal pain.  Was due to have it changed today or tomorrow, but called his urologist due to it being painful and obstructed yesterday.  No one was able to come out yesterday to his house to exchange it.   Nothing is draining into his Foley bag since last night.  Had a UroLift procedure at his urologist office a year ago to help with his BPH symptoms.Marland Kitchen is currently seeking a new urologist and has an appointment in march.  No fever, no vomiting, no diarrhea.         Past Medical History:  Diagnosis Date  . High cholesterol   . Hypertension     Patient Active Problem List   Diagnosis Date Noted  . Right leg weakness 11/05/2019  . Essential hypertension 11/05/2019  . Hyperlipidemia 11/05/2019  . BPH (benign prostatic hyperplasia) 11/05/2019    History reviewed. No pertinent surgical history.     No family history on file.  Social History   Tobacco Use  . Smoking status: Never Smoker  . Smokeless tobacco: Never Used  Substance Use Topics  . Alcohol use: Not Currently  . Drug use: Not Currently    Home Medications Prior to Admission medications   Medication Sig Start Date End Date Taking? Authorizing Provider  atorvastatin (LIPITOR) 20 MG tablet Take 20 mg by mouth at bedtime.  10/21/19   [provider]  brimonidine (ALPHAGAN) 0.2 % ophthalmic solution 1 drop 3 (three) times daily. 09/04/19   [provider]  buPROPion (WELLBUTRIN SR) 150 MG 12 hr tablet  12/11/19   [provider]   carvedilol (COREG) 12.5 MG tablet Take 1 tablet (12.5 mg total) by mouth 2 (two) times daily. 01/30/20   Donato Heinz, MD  clonazePAM (KLONOPIN) 0.5 MG tablet Take 0.5 mg by mouth daily as needed. 12/11/19   [provider]  [facility] ASPIRIN 325 MG tablet Take 325 mg by mouth at bedtime.  10/03/19   [provider]  Diroximel Fumarate 231 MG CPDR Take 1 capsule by mouth twice daily for the first 7 days. Then, increase to 2 capsules by mouth twice daily thereafter. 12/05/19   [provider]  dorzolamide-timolol (COSOPT) 22.3-6.8 MG/ML ophthalmic solution Place 1 drop into both eyes 2 (two) times daily.  09/21/19   [provider]  gabapentin (NEURONTIN) 300 MG capsule Take 300 mg by mouth 3 (three) times daily. 12/16/19   [provider]  hydrochlorothiazide (HYDRODIURIL) 12.5 MG tablet Take by mouth.    [provider]  polyethylene glycol (MIRALAX / GLYCOLAX) 17 g packet Take 17 g by mouth daily.    [provider]  RHOPRESSA 0.02 % SOLN Place 1 drop into both eyes at bedtime.  08/04/19   [provider]  tamsulosin (FLOMAX) 0.4 MG CAPS capsule Take 0.8 mg by mouth at bedtime. 09/29/19   [provider]  traZODone (DESYREL) 50 MG tablet  12/11/19   [provider]    Allergies  Doxycycline  Review of Systems   Review of Systems  All other systems reviewed and are negative.   Physical Exam Updated Vital Signs BP (!) 151/83 (BP Location: Right Wrist)   Pulse 70   Temp 98.2 F (36.8 C)   Resp 20   Ht 6' (1.829 m)   Wt 83.9 kg   SpO2 97%   BMI 25.09 kg/m   Physical Exam Vitals reviewed.  Constitutional:      General: He is in acute distress.  Cardiovascular:     Rate and Rhythm: Normal rate and regular rhythm.     Heart sounds: Normal heart sounds.  Abdominal:     Tenderness: There is abdominal tenderness in the suprapubic area.  Genitourinary:    Comments: Patient has uncircumcised  penis with catheter inserted into the tip.  Does not appear to be bleeding around the insertion site.  No signs of infection around insertion site.  No swelling.. Neurological:     Mental Status: He is alert.     ED Results / Procedures / Treatments   Labs (all labs ordered are listed, but only abnormal results are displayed) Labs Reviewed - No data to display  EKG None  Radiology No results found.  Procedures Procedures   Medications Ordered in ED Medications - No data to display  ED Course  I have reviewed the triage vital signs and the nursing notes.  Pertinent labs & imaging results that were available during my care of the patient were reviewed by me and considered in my medical decision making (see chart for details).    MDM Rules/Calculators/A&P                          Patient presents with 1 day of acute onset of pain at the site of his Foley catheter insertion as well as decreased urine output and leaking of urine around the insertion site.  No signs or symptoms of infection.  Also has tenderness in the suprapubic region likely due to abdominal distention.  Patient had immediate relief of pain with removal of his Foley.reinsertion of new foley drained 400cc urine with some small blood clots.  Suprapubic tenderness resolved.  Advised pt to talk with his urologist about voiding trials to avoid side effects of chronic catheterization.  Advised pt to seek medical attention if he develops signs of infection or blood in his urine or if he become obstructed again.    Final Clinical Impression(s) / ED Diagnoses Final diagnoses:  Obstruction of urinary catheter, initial encounter [facility])    Rx / DC Orders ED Discharge Orders    None       Benay Pike, MD 03/07/20 1057    Blanchie Dessert, MD 03/07/20 2208

## 2020-03-07 NOTE — Discharge Instructions (Signed)
Your symptoms have improved with switching out the Foley catheter.  If you develop any signs of infection such as fever, vomiting, increased urination or urinary pain please seek medical attention.  I would encourage you to discuss with your urologist doing voiding trials to see if you can have the Foley catheter removed at some point in the future.

## 2020-03-08 ENCOUNTER — Encounter (HOSPITAL_BASED_OUTPATIENT_CLINIC_OR_DEPARTMENT_OTHER): Payer: Self-pay | Admitting: *Deleted

## 2020-03-08 ENCOUNTER — Other Ambulatory Visit: Payer: Self-pay

## 2020-03-08 ENCOUNTER — Emergency Department (HOSPITAL_BASED_OUTPATIENT_CLINIC_OR_DEPARTMENT_OTHER)
Admission: EM | Admit: 2020-03-08 | Discharge: 2020-03-08 | Disposition: A | Discharge status: Home / Self Care | Payer: Medicare Other | Attending: Emergency Medicine | Admitting: Emergency Medicine

## 2020-03-08 DIAGNOSIS — Z79899 Other long term (current) drug therapy: Secondary | ICD-10-CM | POA: Insufficient documentation

## 2020-03-08 DIAGNOSIS — T83011A Breakdown (mechanical) of indwelling urethral catheter, initial encounter: Secondary | ICD-10-CM

## 2020-03-08 DIAGNOSIS — I10 Essential (primary) hypertension: Secondary | ICD-10-CM | POA: Insufficient documentation

## 2020-03-08 DIAGNOSIS — T83091A Other mechanical complication of indwelling urethral catheter, initial encounter: Secondary | ICD-10-CM | POA: Diagnosis not present

## 2020-03-08 DIAGNOSIS — R310 Gross hematuria: Secondary | ICD-10-CM

## 2020-03-08 DIAGNOSIS — Y828 Other medical devices associated with adverse incidents: Secondary | ICD-10-CM | POA: Diagnosis not present

## 2020-03-08 DIAGNOSIS — R319 Hematuria, unspecified: Secondary | ICD-10-CM | POA: Diagnosis not present

## 2020-03-08 LAB — URINALYSIS, MICROSCOPIC (REFLEX): RBC / HPF: >50 RBC/hpf (ref 0–5)

## 2020-03-08 LAB — URINALYSIS, ROUTINE W REFLEX MICROSCOPIC
Bilirubin Urine: NEGATIVE
Glucose, UA: NEGATIVE mg/dL
Ketones, ur: NEGATIVE mg/dL
Nitrite: NEGATIVE
Protein, ur: 100 mg/dL — AB
Specific Gravity, Urine: 1.015 (ref 1.005–1.030)
pH: 7.5 (ref 5.0–8.0)

## 2020-03-08 MED ORDER — CEPHALEXIN 500 MG PO CAPS: 500.0000 mg | ORAL_CAPSULE | Freq: Two times a day (BID) | ORAL | 0 refills | Status: DC

## 2020-03-08 MED ORDER — CEPHALEXIN 250 MG PO CAPS
500.0000 mg | ORAL_CAPSULE | Freq: Once | ORAL | Status: AC
  Administered 2020-03-08: 500 mg via ORAL
  Filled 2020-03-08: qty 2

## 2020-03-08 NOTE — ED Provider Notes (Signed)
[facility] EMERGENCY DEPARTMENT Provider Note   CSN: [id number] Arrival date & time: 03/08/20  1706     History No chief complaint on file.   Bryan Robertson is a 70 y.o. male.  Patient with history of BPH, previous episodes of urinary retention presents to the emergency department for Foley catheter evaluation.  He states that it has been leaking today.  Patient was seen in the emergency department yesterday for a leaking, likely obstructed Foley catheter.  There were some blood clots noted at that time.  Patient presents today with continued leaking from around the outside of the catheter with poor drainage.  Catheter was replaced yesterday.  Patient denies fever, nausea, vomiting, diarrhea.  No abdominal discomfort.        Past Medical History:  Diagnosis Date  . High cholesterol   . Hypertension     Patient Active Problem List   Diagnosis Date Noted  . Right leg weakness 11/05/2019  . Essential hypertension 11/05/2019  . Hyperlipidemia 11/05/2019  . BPH (benign prostatic hyperplasia) 11/05/2019    History reviewed. No pertinent surgical history.     No family history on file.  Social History   Tobacco Use  . Smoking status: Never Smoker  . Smokeless tobacco: Never Used  Substance Use Topics  . Alcohol use: Not Currently  . Drug use: Not Currently    Home Medications Prior to Admission medications   Medication Sig Start Date End Date Taking? Authorizing Provider  atorvastatin (LIPITOR) 20 MG tablet Take 20 mg by mouth at bedtime.  10/21/19   [provider]  brimonidine (ALPHAGAN) 0.2 % ophthalmic solution 1 drop 3 (three) times daily. 09/04/19   [provider]  buPROPion (WELLBUTRIN SR) 150 MG 12 hr tablet  12/11/19   [provider]  carvedilol (COREG) 12.5 MG tablet Take 1 tablet (12.5 mg total) by mouth 2 (two) times daily. 01/30/20   Donato Heinz, MD  clonazePAM (KLONOPIN) 0.5 MG tablet Take 0.5 mg by mouth  daily as needed. 12/11/19   [provider]  [employer] ASPIRIN 325 MG tablet Take 325 mg by mouth at bedtime.  10/03/19   [provider]  Diroximel Fumarate 231 MG CPDR Take 1 capsule by mouth twice daily for the first 7 days. Then, increase to 2 capsules by mouth twice daily thereafter. 12/05/19   [provider]  dorzolamide-timolol (COSOPT) 22.3-6.8 MG/ML ophthalmic solution Place 1 drop into both eyes 2 (two) times daily.  09/21/19   [provider]  gabapentin (NEURONTIN) 300 MG capsule Take 300 mg by mouth 3 (three) times daily. 12/16/19   [provider]  hydrochlorothiazide (HYDRODIURIL) 12.5 MG tablet Take by mouth.    [provider]  polyethylene glycol (MIRALAX / GLYCOLAX) 17 g packet Take 17 g by mouth daily.    [provider]  RHOPRESSA 0.02 % SOLN Place 1 drop into both eyes at bedtime.  08/04/19   [provider]  tamsulosin (FLOMAX) 0.4 MG CAPS capsule Take 0.8 mg by mouth at bedtime. 09/29/19   [provider]  traZODone (DESYREL) 50 MG tablet  12/11/19   [provider]    Allergies    Doxycycline  Review of Systems   Review of Systems  Constitutional: Negative for fever.  HENT: Negative for rhinorrhea and sore throat.   Eyes: Negative for redness.  Respiratory: Negative for cough.   Cardiovascular: Negative for chest pain.  Gastrointestinal: Negative for abdominal pain, diarrhea, nausea and  vomiting.  Genitourinary: Positive for difficulty urinating and hematuria. Negative for dysuria.  Musculoskeletal: Negative for myalgias.  Skin: Negative for rash.  Neurological: Negative for headaches.    Physical Exam Updated Vital Signs BP (!) 155/84 (BP Location: Right Arm)   Pulse 75   Temp 98.2 F (36.8 C) (Oral)   Resp 16   Ht 6' (1.829 m)   Wt 83.9 kg   SpO2 96%   BMI 25.09 kg/m   Physical Exam Vitals and nursing note reviewed.  Constitutional:      Appearance: He is well-developed  and well-nourished.  HENT:     Head: Normocephalic and atraumatic.  Eyes:     Conjunctiva/sclera: Conjunctivae normal.  Pulmonary:     Effort: No respiratory distress.  Genitourinary:    Comments: Foley catheter in place.  No active drainage from around the meatus.  Urine is dark in color. Musculoskeletal:     Cervical back: Normal range of motion and neck supple.  Skin:    General: Skin is warm and dry.  Neurological:     Mental Status: He is alert.  Psychiatric:        Mood and Affect: Mood and affect normal.     ED Results / Procedures / Treatments   Labs (all labs ordered are listed, but only abnormal results are displayed) Labs Reviewed  URINALYSIS, ROUTINE W REFLEX MICROSCOPIC - Abnormal; Notable for the following components:      Result Value   Color, Urine AMBER (*)    APPearance CLOUDY (*)    Hgb urine dipstick LARGE (*)    Protein, ur 100 (*)    Leukocytes,Ua MODERATE (*)    All other components within normal limits  URINALYSIS, MICROSCOPIC (REFLEX) - Abnormal; Notable for the following components:   Bacteria, UA MANY (*)    All other components within normal limits  URINE CULTURE    EKG None  Radiology No results found.  Procedures Procedures   Medications Ordered in ED Medications - No data to display  ED Course  I have reviewed the triage vital signs and the nursing notes.  Pertinent labs & imaging results that were available during my care of the patient were reviewed by me and considered in my medical decision making (see chart for details).  Patient seen and examined. Work-up initiated.  Will check UA today.  Foley was replaced yesterday.  Patient appears well, nontoxic.  Vital signs reviewed and are as follows: BP (!) 155/84 (BP Location: Right Arm)   Pulse 75   Temp 98.2 F (36.8 C) (Oral)   Resp 16   Ht 6' (1.829 m)   Wt 83.9 kg   SpO2 96%   BMI 25.09 kg/m    7:10 PM UA with blood, 11-20 WBCs and bacteria.  Foley was flushed and  drained well.  Appears to be draining well now.  Patient discussed with Dr. Karle Starch.  We will start patient on course of Keflex.  Concerned that he could have cystitis which is causing hematuria and pyuria --leading to dysfunction of the Foley catheter.  This was replaced yesterday.  Culture pending.  Patient has plans to follow-up with [facility] urology in 1 month.  Patient and daughter at bedside comfortable discharged home.     MDM Rules/Calculators/A&P                         Patient with Foley catheter problems, improved now.  This is the second visit  in 2 days.  He does have hematuria, concern for bladder infection given blood and pyuria.    Final Clinical Impression(s) / ED Diagnoses Final diagnoses:  Malfunction of Foley catheter, initial encounter [facility])  Gross hematuria    Rx / DC Orders ED Discharge Orders         Ordered    cephALEXin (KEFLEX) 500 MG capsule  2 times daily        03/08/20 1907           Carlisle Cater, Hershal Coria 03/08/20 1912    Truddie Hidden, MD 03/08/20 2300

## 2020-03-08 NOTE — ED Notes (Signed)
ED Provider at bedside. 

## 2020-03-08 NOTE — Discharge Instructions (Signed)
Please read and follow all provided instructions.  Your diagnoses today include:  1. Malfunction of Foley catheter, initial encounter ([place])   2. Gross hematuria     Tests performed today include:  Urine test - shows blood and some infection fighting cells in the urine  Urine culture -pending  Vital signs. See below for your results today.   Medications prescribed:   Keflex (cephalexin) - antibiotic  You have been prescribed an antibiotic medicine: take the entire course of medicine even if you are feeling better. Stopping early can cause the antibiotic not to work.  Take any prescribed medications only as directed.  Home care instructions:  Follow any educational materials contained in this packet.  BE VERY CAREFUL not to take multiple medicines containing Tylenol (also called acetaminophen). Doing so can lead to an overdose which can damage your liver and cause liver failure and possibly death.   Follow-up instructions: Please follow-up with your primary care provider in the next 7 days for further evaluation of your symptoms.   Return instructions:   Please return to the Emergency Department if you experience worsening symptoms.   Return with fever, vomiting, abdominal pain.  Please return if you have any other emergent concerns.  Additional Information:  Your vital signs today were: BP (!) 144/45 (BP Location: Right Arm)   Pulse 70   Temp 98.2 F (36.8 C) (Oral)   Resp 18   Ht 6' (1.829 m)   Wt 83.9 kg   SpO2 100%   BMI 25.09 kg/m  If your blood pressure (BP) was elevated above 135/85 this visit, please have this repeated by your doctor within one month. --------------

## 2020-03-08 NOTE — ED Triage Notes (Signed)
States he was here yesterday due to foley leaking and the catheter was changed. Here today with foley leaking.

## 2020-03-10 LAB — URINE CULTURE: Culture: 80000 — AB

## 2020-03-11 NOTE — Progress Notes (Signed)
ED Antimicrobial Stewardship Positive Culture Follow Up   Bryan Robertson is an 69 y.o. male who presented to [facility] on 03/08/2020 with a chief complaint of pain associated with catheter.   Recent Results (from the past 720 hour(s))  Urine Culture     Status: Abnormal   Collection Time: 03/08/20  5:47 PM   Specimen: Urine, Random  Result Value Ref Range Status   Specimen Description   Final    URINE, RANDOM Performed at [facility], [address] [place], [place] [address]    Special Requests   Final    NONE Performed at [facility], [address] [place], [place] [address]    Culture (A)  Final    80,000 COLONIES/mL DIPHTHEROIDS(CORYNEBACTERIUM SPECIES) Standardized susceptibility testing for this organism is not available. Performed at [facility], [place] [address] [place], [place] [address]    Report Status 03/10/2020 FINAL  Final     Treated with cephalexin, organism likely a contaminant   New antibiotic prescription: STOP cephalexin; no further antibiotic therapy is indicated  ED Provider: Silverio Decamp, PA-C    [name]; PharmD-Candidate  03/11/2020, 9:51 AM Monday - Friday phone -  [phone] Saturday - Sunday phone - [phone]

## 2020-03-24 ENCOUNTER — Encounter (HOSPITAL_BASED_OUTPATIENT_CLINIC_OR_DEPARTMENT_OTHER): Payer: Self-pay | Admitting: Emergency Medicine

## 2020-03-24 ENCOUNTER — Emergency Department (HOSPITAL_BASED_OUTPATIENT_CLINIC_OR_DEPARTMENT_OTHER)
Admission: EM | Admit: 2020-03-24 | Discharge: 2020-03-24 | Disposition: A | Discharge status: Home / Self Care | Payer: Medicare Other | Attending: Emergency Medicine | Admitting: Emergency Medicine

## 2020-03-24 ENCOUNTER — Other Ambulatory Visit: Payer: Self-pay

## 2020-03-24 DIAGNOSIS — T83098A Other mechanical complication of other indwelling urethral catheter, initial encounter: Secondary | ICD-10-CM | POA: Diagnosis not present

## 2020-03-24 DIAGNOSIS — Z960 Presence of urogenital implants: Secondary | ICD-10-CM | POA: Diagnosis not present

## 2020-03-24 DIAGNOSIS — I10 Essential (primary) hypertension: Secondary | ICD-10-CM | POA: Insufficient documentation

## 2020-03-24 DIAGNOSIS — T839XXA Unspecified complication of genitourinary prosthetic device, implant and graft, initial encounter: Secondary | ICD-10-CM

## 2020-03-24 DIAGNOSIS — Z79899 Other long term (current) drug therapy: Secondary | ICD-10-CM | POA: Diagnosis not present

## 2020-03-24 NOTE — ED Triage Notes (Signed)
Reports he had a foley catheter for retention in October of 21.  Followed up with urology.  Has been passed from urology to PCP.  Continues to have issues with catheter leaking.  Seen here earlier in the month for the same.  Catheter was changed at that time.

## 2020-03-24 NOTE — ED Provider Notes (Signed)
[facility] Provider Note   CSN: [id number] Arrival date & time: 03/24/20  1752     History Chief Complaint  Patient presents with  . catheter issue    Bryan Robertson is a 70 y.o. male.  Patient is a 70 year old male with a history of hypertension, urinary retention with indwelling Foley catheter at this time who is presenting today because he has been leaking urine around the catheter.  He denies any pain or discomfort.  He does have plans to follow-up with urology next week but he just reports it is very irritating for her to continue to leak.  Denies any trauma to the area he has not had any blood in his urine.  It was last changed on 03/07/2020 when he was having severe pain.  All of that has subsided since then.  The history is provided by the patient.       Past Medical History:  Diagnosis Date  . High cholesterol   . Hypertension     Patient Active Problem List   Diagnosis Date Noted  . Right leg weakness 11/05/2019  . Essential hypertension 11/05/2019  . Hyperlipidemia 11/05/2019  . BPH (benign prostatic hyperplasia) 11/05/2019    History reviewed. No pertinent surgical history.     No family history on file.  Social History   Tobacco Use  . Smoking status: Never Smoker  . Smokeless tobacco: Never Used  Substance Use Topics  . Alcohol use: Not Currently  . Drug use: Not Currently    Home Medications Prior to Admission medications   Medication Sig Start Date End Date Taking? Authorizing Provider  atorvastatin (LIPITOR) 20 MG tablet Take 20 mg by mouth at bedtime.  10/21/19   [provider]  brimonidine (ALPHAGAN) 0.2 % ophthalmic solution 1 drop 3 (three) times daily. 09/04/19   [provider]  buPROPion (WELLBUTRIN SR) 150 MG 12 hr tablet  12/11/19   [provider]  carvedilol (COREG) 12.5 MG tablet Take 1 tablet (12.5 mg total) by mouth 2 (two) times daily. 01/30/20   Donato Heinz, MD   cephALEXin (KEFLEX) 500 MG capsule Take 1 capsule (500 mg total) by mouth 2 (two) times daily. 03/08/20   Carlisle Cater, PA-C  clonazePAM (KLONOPIN) 0.5 MG tablet Take 0.5 mg by mouth daily as needed. 12/11/19   [provider]  [employer] ASPIRIN 325 MG tablet Take 325 mg by mouth at bedtime.  10/03/19   [provider]  Diroximel Fumarate 231 MG CPDR Take 1 capsule by mouth twice daily for the first 7 days. Then, increase to 2 capsules by mouth twice daily thereafter. 12/05/19   [provider]  dorzolamide-timolol (COSOPT) 22.3-6.8 MG/ML ophthalmic solution Place 1 drop into both eyes 2 (two) times daily.  09/21/19   [provider]  gabapentin (NEURONTIN) 300 MG capsule Take 300 mg by mouth 3 (three) times daily. 12/16/19   [provider]  hydrochlorothiazide (HYDRODIURIL) 12.5 MG tablet Take by mouth.    [provider]  polyethylene glycol (MIRALAX / GLYCOLAX) 17 g packet Take 17 g by mouth daily.    [provider]  RHOPRESSA 0.02 % SOLN Place 1 drop into both eyes at bedtime.  08/04/19   [provider]  tamsulosin (FLOMAX) 0.4 MG CAPS capsule Take 0.8 mg by mouth at bedtime. 09/29/19   [provider]  traZODone (DESYREL) 50 MG tablet  12/11/19   [provider]    Allergies  Doxycycline  Review of Systems   Review of Systems  All other systems reviewed and are negative.   Physical Exam Updated Vital Signs BP (!) 158/92 (BP Location: Left Arm)   Pulse 80   Temp 98.1 F (36.7 C) (Oral)   Resp 18   Ht 6' (1.829 m)   Wt 83.9 kg   SpO2 95%   BMI 25.09 kg/m   Physical Exam Vitals and nursing note reviewed.  Constitutional:      Appearance: Normal appearance. He is normal weight.  HENT:     Head: Normocephalic.  Cardiovascular:     Rate and Rhythm: Normal rate.  Pulmonary:     Effort: Pulmonary effort is normal.  Abdominal:     General: Abdomen is flat.     Palpations: Abdomen is soft.      Tenderness: There is no abdominal tenderness. There is no guarding or rebound.  Genitourinary:    Penis: Normal.      Comments: 48 French Foley catheter present. Skin:    General: Skin is warm.  Neurological:     Mental Status: He is alert. Mental status is at baseline.  Psychiatric:        Mood and Affect: Mood normal.        Behavior: Behavior normal.        Thought Content: Thought content normal.     ED Results / Procedures / Treatments   Labs (all labs ordered are listed, but only abnormal results are displayed) Labs Reviewed - No data to display  EKG None  Radiology No results found.  Procedures Procedures   Medications Ordered in ED Medications - No data to display  ED Course  I have reviewed the triage vital signs and the nursing notes.  Pertinent labs & imaging results that were available during my care of the patient were reviewed by me and considered in my medical decision making (see chart for details).    MDM Rules/Calculators/A&P                          Patient presenting today because he is having leakage around his Foley catheter.  He is having no other symptoms.  Low suspicion for infection at this time or hemorrhage.  Suspect the catheter may be a little too small and he is just having leakage around it.  Will try increasing from a 47 [place] to an 16 [place].  Patient has plans to follow-up with urology this coming week.  7:13 PM When removing the catheter patient only had 3 mL of fluid in the balloon.  Most likely the balloon broke is why the patient started having leaking 3 days ago.  We will actually replaced the catheter with a 16 [place].  Final Clinical Impression(s) / ED Diagnoses Final diagnoses:  Problem with Foley catheter, initial encounter [facility])    Rx / DC Orders ED Discharge Orders    None       Blanchie Dessert, MD 03/24/20 1914

## 2020-04-03 ENCOUNTER — Ambulatory Visit (HOSPITAL_COMMUNITY): Payer: Medicare Other | Attending: Cardiology

## 2020-04-03 ENCOUNTER — Other Ambulatory Visit: Payer: Self-pay

## 2020-04-03 DIAGNOSIS — R55 Syncope and collapse: Secondary | ICD-10-CM

## 2020-04-03 DIAGNOSIS — I422 Other hypertrophic cardiomyopathy: Secondary | ICD-10-CM | POA: Diagnosis not present

## 2020-04-03 LAB — ECHOCARDIOGRAM COMPLETE
Area-P 1/2: 2.6 cm2
S' Lateral: 2.9 cm

## 2020-04-03 MED ORDER — PERFLUTREN LIPID MICROSPHERE
1.0000 mL | INTRAVENOUS | Status: AC | PRN
  Administered 2020-04-03: 1 mL via INTRAVENOUS

## 2020-04-07 NOTE — Progress Notes (Signed)
Cardiology Office Note:    Date:  04/10/2020   ID:  Bryan Robertson, DOB 1950-02-13, MRN [id number]  PCP:  Assunta Curtis, FNP  Cardiologist:  No primary care provider on file.  Electrophysiologist:  None   Referring MD: Assunta Curtis, FNP   Chief Complaint  Patient presents with  . Cardiomyopathy    History of Present Illness:    Bryan Robertson is a 70 y.o. male with a hx of hypertension, hyperlipidemia, multiple sclerosis who presents for follow-up.  He was referred by Luanna Salk, FNP for evaluation of hypertension on 12/01/2019.  He had recent hospitalization from 10/2 through 11/10/2019.  He had presented with right-sided weakness/numbness and gait difficulty.  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  He was treated with high-dose steroids with improvement.  He was seen by his PCP for lower extremity edema recently, lower extremity duplex on 10/17/2019 was negative for DVT.  Echocardiogram on 10/20/2019 showed EF 75%, severe LVH with echogenicity suggestive of infiltrative disease, normal RV size/function, moderate left atrial dilatation.  Labs on 10/31/2019 showed hemoglobin 16, platelets 243, creatinine 1.2, potassium 4.1, sodium 140, albumin 4.2, ferritin 166. Exercise Myoview in [facility] on 05/25/2019 showed normal perfusion, EF 55%.  He denies any chest pain or dyspnea.  Does report intermittent lightheadedness but denies any syncope.  No palpitations.  He walks with a walker.  Reports intermittent lower extremity edema.  No smoking history.  Family history includes father died of MI in 60s.  Cardiac MRI on 12/25/2019 showed severe asymmetric LVH measuring up to 27 mm and basal anterior septum (7 mm and posterior wall), consistent with hypertrophic cardiomyopathy, RV insertion site LGE consistent with HCM, with LGE accounting for 6% total myocardial mass, normal biventricular function.  He had near syncopal episode had urology appointment on 01/01/2020 while doing a voiding trial.   He was seen in the ED, thought to be vasovagal syncope.  Zio patch x7 days was done on 01/25/2020, which showed 124 episodes of SVT, longest lasting 3 minutes, 2 episodes of NSVT with longest lasting 6 beats, occasional PVCs (1.1%).  Echocardiogram on 04/03/2020 showed EF 65 to 70%, severe asymmetric LV hypertrophy of the basal septal segment consistent with HCM, no LVOT obstruction.  Since last clinic visit, reports that he has been doing well.  Denies any chest pain, dyspnea, lightheadedness, syncope, lower extremity edema, or palpitations.  Reports BP 130/70s at home.    Past Medical History:  Diagnosis Date  . High cholesterol   . Hypertension     No past surgical history on file.  Current Medications: Current Meds  Medication Sig  . atorvastatin (LIPITOR) 20 MG tablet Take 20 mg by mouth at bedtime.   . baclofen (LIORESAL) 10 MG tablet Take by mouth.  . brimonidine (ALPHAGAN) 0.2 % ophthalmic solution 1 drop 3 (three) times daily.  Marland Kitchen buPROPion (WELLBUTRIN SR) 150 MG 12 hr tablet   . carvedilol (COREG) 12.5 MG tablet Take 1 tablet (12.5 mg total) by mouth 2 (two) times daily.  . cephALEXin (KEFLEX) 500 MG capsule Take 1 capsule (500 mg total) by mouth 2 (two) times daily.  . clonazePAM (KLONOPIN) 0.5 MG tablet Take 0.5 mg by mouth daily as needed.  . [employer] ASPIRIN 325 MG tablet Take 325 mg by mouth at bedtime.   . Diroximel Fumarate 231 MG CPDR Take 1 capsule by mouth twice daily for the first 7 days. Then, increase to 2 capsules by mouth twice daily thereafter.  . dorzolamide-timolol (COSOPT)  22.3-6.8 MG/ML ophthalmic solution Place 1 drop into both eyes 2 (two) times daily.   Marland Kitchen gabapentin (NEURONTIN) 300 MG capsule Take 300 mg by mouth 3 (three) times daily.  . hydrochlorothiazide (HYDRODIURIL) 12.5 MG tablet Take by mouth.  . polyethylene glycol (MIRALAX / GLYCOLAX) 17 g packet Take 17 g by mouth daily.  . RHOPRESSA 0.02 % SOLN Place 1 drop into both eyes at bedtime.   . tamsulosin  (FLOMAX) 0.4 MG CAPS capsule Take 0.8 mg by mouth at bedtime.  . traZODone (DESYREL) 50 MG tablet      Allergies:   Doxycycline   Social History   Socioeconomic History  . Marital status: Married    Spouse name: Not on file  . Number of children: Not on file  . Years of education: Not on file  . Highest education level: Not on file  Occupational History  . Not on file  Tobacco Use  . Smoking status: Never Smoker  . Smokeless tobacco: Never Used  Substance and Sexual Activity  . Alcohol use: Not Currently  . Drug use: Not Currently  . Sexual activity: Not on file  Other Topics Concern  . Not on file  Social History Narrative  . Not on file   Social Determinants of Health   Financial Resource Strain: Not on file  Food Insecurity: Not on file  Transportation Needs: Not on file  Physical Activity: Not on file  Stress: Not on file  Social Connections: Not on file     Family History: Father died of MI in 36s  ROS:   Please see the history of present illness.     All other systems reviewed and are negative.  EKGs/Labs/Other Studies Reviewed:    The following studies were reviewed today:   EKG:  EKG is not ordered today.  The ekg ordered at prior clinic visit demonstrates NSR, rate 94, LVH with repolarization abnormalities, diffuse TWI, QTC 462  Recent Labs: 11/10/2019: ALT 15 01/01/2020: BUN 16; Creatinine, Ser 0.83; Hemoglobin 14.6; Platelets 207; Potassium 3.4; Sodium 142  Recent Lipid Panel No results found for: CHOL, TRIG, HDL, CHOLHDL, VLDL, LDLCALC, LDLDIRECT  Physical Exam:    VS:  BP (!) 160/84   Pulse 60   Ht 6' (1.829 m)   Wt 178 lb 9.6 oz (81 kg)   SpO2 96%   BMI 24.22 kg/m     Wt Readings from Last 3 Encounters:  04/10/20 178 lb 9.6 oz (81 kg)  03/24/20 185 lb (83.9 kg)  03/08/20 184 lb 15.5 oz (83.9 kg)     GEN:  in no acute distress HEENT: Normal NECK: No JVD; No carotid bruits CARDIAC:RRR, no murmurs, rubs, gallops RESPIRATORY:   Clear to auscultation without rales, wheezing or rhonchi  ABDOMEN: Soft, non-tender, non-distended MUSCULOSKELETAL:  No edema; No deformity  SKIN: Warm and dry NEUROLOGIC:  Alert and oriented x 3 PSYCHIATRIC:  Normal affect   ASSESSMENT:    1. Hypertrophic cardiomyopathy ([place])   2. SVT (supraventricular tachycardia) ([place])   3. Essential hypertension   4. Hyperlipidemia, unspecified hyperlipidemia type    PLAN:    HCM: Cardiac MRI on 12/25/2019 showed severe asymmetric LVH measuring up to 27 mm and basal anterior septum (7 mm and posterior wall), consistent with hypertrophic cardiomyopathy, RV insertion site LGE consistent with HCM, with LGE accounting for 6% total myocardial mass, normal biventricular function.  Echocardiogram 04/03/2020 shows no LVOT obstruction. -Recommend daughters be screened for HCM -Given recent near syncopal episode, which  suspect was vasovagal during voiding trial, Zio patch x7 days was done: showed 2 episodes of NSVT lasting 6 beats. Given HCM with NSVT but age greater than 27, no indication for ICD at this time  SVT: Zio patch x7 days was done on 01/25/2020, which showed 124 episodes of SVT, longest lasting 3 minutes -Continue carvedilol to 12.5 mg twice daily  Hypertension: Continue carvedilol 12.5 mg twice daily  Hyperlipidemia: On atorvastatin 20 mg daily.  LDL 76 on 02/23/2019  Multiple sclerosis: Follows with neurology at [facility].  RTC in 6 months   Medication Adjustments/Labs and Tests Ordered: Current medicines are reviewed at length with the patient today.  Concerns regarding medicines are outlined above.  No orders of the defined types were placed in this encounter.  No orders of the defined types were placed in this encounter.   Patient Instructions  Medication Instructions:  Your physician recommends that you continue on your current medications as directed. Please refer to the Current Medication list given to you today.  *If you need  a refill on your cardiac medications before your next appointment, please call your pharmacy*  Follow-Up: At [facility], you and your health needs are our priority.  As part of our continuing mission to provide you with exceptional heart care, we have created designated Provider Care Teams.  These Care Teams include your primary Cardiologist (physician) and Advanced Practice Providers (APPs -  Physician Assistants and Nurse Practitioners) who all work together to provide you with the care you need, when you need it.  We recommend signing up for the patient portal called "MyChart".  Sign up information is provided on this After Visit Summary.  MyChart is used to connect with patients for Virtual Visits (Telemedicine).  Patients are able to view lab/test results, encounter notes, upcoming appointments, etc.  Non-urgent messages can be sent to your provider as well.   To learn more about what you can do with MyChart, go to [email or web address].    Your next appointment:   6 month(s)  The format for your next appointment:   In Person  Provider:   Oswaldo Milian, MD        Signed, Donato Heinz, MD  04/10/2020 6:24 PM    [place]

## 2020-04-10 ENCOUNTER — Other Ambulatory Visit: Payer: Self-pay

## 2020-04-10 ENCOUNTER — Encounter: Payer: Self-pay | Admitting: Cardiology

## 2020-04-10 ENCOUNTER — Ambulatory Visit (INDEPENDENT_AMBULATORY_CARE_PROVIDER_SITE_OTHER): Payer: Medicare Other | Admitting: Cardiology

## 2020-04-10 VITALS — BP 160/84 | HR 60 | Ht 72.0 in | Wt 178.6 lb

## 2020-04-10 DIAGNOSIS — I10 Essential (primary) hypertension: Secondary | ICD-10-CM

## 2020-04-10 DIAGNOSIS — I422 Other hypertrophic cardiomyopathy: Secondary | ICD-10-CM | POA: Diagnosis not present

## 2020-04-10 DIAGNOSIS — I471 Supraventricular tachycardia: Secondary | ICD-10-CM | POA: Diagnosis not present

## 2020-04-10 DIAGNOSIS — E785 Hyperlipidemia, unspecified: Secondary | ICD-10-CM | POA: Diagnosis not present

## 2020-04-10 NOTE — Patient Instructions (Signed)

## 2020-04-13 ENCOUNTER — Encounter (HOSPITAL_BASED_OUTPATIENT_CLINIC_OR_DEPARTMENT_OTHER): Payer: Self-pay | Admitting: Obstetrics and Gynecology

## 2020-04-13 ENCOUNTER — Emergency Department (HOSPITAL_BASED_OUTPATIENT_CLINIC_OR_DEPARTMENT_OTHER)
Admission: EM | Admit: 2020-04-13 | Discharge: 2020-04-13 | Disposition: A | Discharge status: Home / Self Care | Payer: Medicare Other | Attending: Emergency Medicine | Admitting: Emergency Medicine

## 2020-04-13 ENCOUNTER — Other Ambulatory Visit: Payer: Self-pay

## 2020-04-13 DIAGNOSIS — N3000 Acute cystitis without hematuria: Secondary | ICD-10-CM | POA: Insufficient documentation

## 2020-04-13 DIAGNOSIS — Z79899 Other long term (current) drug therapy: Secondary | ICD-10-CM | POA: Diagnosis not present

## 2020-04-13 DIAGNOSIS — Y846 Urinary catheterization as the cause of abnormal reaction of the patient, or of later complication, without mention of misadventure at the time of the procedure: Secondary | ICD-10-CM | POA: Diagnosis not present

## 2020-04-13 DIAGNOSIS — T83091A Other mechanical complication of indwelling urethral catheter, initial encounter: Secondary | ICD-10-CM | POA: Insufficient documentation

## 2020-04-13 DIAGNOSIS — I10 Essential (primary) hypertension: Secondary | ICD-10-CM | POA: Diagnosis not present

## 2020-04-13 DIAGNOSIS — T839XXA Unspecified complication of genitourinary prosthetic device, implant and graft, initial encounter: Secondary | ICD-10-CM

## 2020-04-13 HISTORY — DX: Presence of other specified devices: Z97.8

## 2020-04-13 HISTORY — DX: Retention of urine, unspecified: R33.9

## 2020-04-13 LAB — URINALYSIS, ROUTINE W REFLEX MICROSCOPIC
Bilirubin Urine: NEGATIVE
Glucose, UA: NEGATIVE mg/dL
Ketones, ur: NEGATIVE mg/dL
Nitrite: NEGATIVE
Protein, ur: >300 mg/dL — AB
RBC / HPF: >50 RBC/hpf — ABNORMAL HIGH (ref 0–5)
Specific Gravity, Urine: 1.027 (ref 1.005–1.030)
WBC, UA: >50 WBC/hpf — ABNORMAL HIGH (ref 0–5)
pH: 8 (ref 5.0–8.0)

## 2020-04-13 MED ORDER — CEPHALEXIN 500 MG PO CAPS: 500.0000 mg | ORAL_CAPSULE | Freq: Four times a day (QID) | ORAL | 0 refills | Status: DC

## 2020-04-13 MED ORDER — CEPHALEXIN 250 MG PO CAPS
500.0000 mg | ORAL_CAPSULE | Freq: Once | ORAL | Status: AC
  Administered 2020-04-13: 500 mg via ORAL
  Filled 2020-04-13: qty 2

## 2020-04-13 NOTE — ED Notes (Signed)
New leg back given

## 2020-04-13 NOTE — ED Provider Notes (Signed)
[facility] EMERGENCY DEPT Provider Note   CSN: [id number] Arrival date & time: 04/13/20  1434     History Chief Complaint  Patient presents with  . Catheter Leaking    Bryan Robertson is a 70 y.o. male.  Pt presents to the ED today with leakage around his foley cath.  He said he has a hx of urinary retention.  He is followed by [facility] urology.  Pt said whenever he urinates, the urine leaks around the cath.  Some odor to the urine.  No fevers.        Past Medical History:  Diagnosis Date  . Chronic indwelling Foley catheter   . High cholesterol   . Hypertension   . Urinary retention     Patient Active Problem List   Diagnosis Date Noted  . Right leg weakness 11/05/2019  . Essential hypertension 11/05/2019  . Hyperlipidemia 11/05/2019  . BPH (benign prostatic hyperplasia) 11/05/2019    History reviewed. No pertinent surgical history.     No family history on file.  Social History   Tobacco Use  . Smoking status: Never Smoker  . Smokeless tobacco: Never Used  Vaping Use  . Vaping Use: Never used  Substance Use Topics  . Alcohol use: Not Currently  . Drug use: Not Currently    Home Medications Prior to Admission medications   Medication Sig Start Date End Date Taking? Authorizing Provider  cephALEXin (KEFLEX) 500 MG capsule Take 1 capsule (500 mg total) by mouth 4 (four) times daily. 04/13/20  Yes Isla Pence, MD  atorvastatin (LIPITOR) 20 MG tablet Take 20 mg by mouth at bedtime.  10/21/19   [provider]  baclofen (LIORESAL) 10 MG tablet Take by mouth. 04/05/20 05/05/20  [provider]  brimonidine (ALPHAGAN) 0.2 % ophthalmic solution 1 drop 3 (three) times daily. 09/04/19   [provider]  buPROPion (WELLBUTRIN SR) 150 MG 12 hr tablet  12/11/19   [provider]  carvedilol (COREG) 12.5 MG tablet Take 1 tablet (12.5 mg total) by mouth 2 (two) times daily. 01/30/20   Donato Heinz, MD  clonazePAM  (KLONOPIN) 0.5 MG tablet Take 0.5 mg by mouth daily as needed. 12/11/19   [provider]  [employer] ASPIRIN 325 MG tablet Take 325 mg by mouth at bedtime.  10/03/19   [provider]  Diroximel Fumarate 231 MG CPDR Take 1 capsule by mouth twice daily for the first 7 days. Then, increase to 2 capsules by mouth twice daily thereafter. 12/05/19   [provider]  dorzolamide-timolol (COSOPT) 22.3-6.8 MG/ML ophthalmic solution Place 1 drop into both eyes 2 (two) times daily.  09/21/19   [provider]  gabapentin (NEURONTIN) 300 MG capsule Take 300 mg by mouth 3 (three) times daily. 12/16/19   [provider]  hydrochlorothiazide (HYDRODIURIL) 12.5 MG tablet Take by mouth.    [provider]  polyethylene glycol (MIRALAX / GLYCOLAX) 17 g packet Take 17 g by mouth daily.    [provider]  RHOPRESSA 0.02 % SOLN Place 1 drop into both eyes at bedtime.  08/04/19   [provider]  tamsulosin (FLOMAX) 0.4 MG CAPS capsule Take 0.8 mg by mouth at bedtime. 09/29/19   [provider]  traZODone (DESYREL) 50 MG tablet  12/11/19   [provider]    Allergies    Doxycycline  Review of Systems   Review of Systems  Genitourinary:       Foley leakage  Physical Exam Updated Vital Signs BP (!) 151/75 (BP Location: Right Arm)   Pulse 68   Temp 98.2 F (36.8 C) (Oral)   Resp 18   Ht 6' (1.829 m)   Wt 80.3 kg   SpO2 100%   BMI 23.99 kg/m   Physical Exam Vitals and nursing note reviewed.  Constitutional:      Appearance: Normal appearance.  HENT:     Head: Normocephalic and atraumatic.     Right Ear: External ear normal.     Left Ear: External ear normal.     Nose: Nose normal.     Mouth/Throat:     Mouth: Mucous membranes are moist.     Pharynx: Oropharynx is clear.  Eyes:     Extraocular Movements: Extraocular movements intact.     Conjunctiva/sclera: Conjunctivae normal.     Pupils: Pupils are equal, round,  and reactive to light.  Cardiovascular:     Rate and Rhythm: Normal rate and regular rhythm.     Pulses: Normal pulses.     Heart sounds: Normal heart sounds.  Pulmonary:     Effort: Pulmonary effort is normal.     Breath sounds: Normal breath sounds.  Abdominal:     General: Abdomen is flat. Bowel sounds are normal.     Palpations: Abdomen is soft.  Genitourinary:    Comments: Indwelling 16 F foley Musculoskeletal:        General: Normal range of motion.     Cervical back: Normal range of motion and neck supple.  Skin:    General: Skin is warm.     Capillary Refill: Capillary refill takes less than 2 seconds.  Neurological:     General: No focal deficit present.     Mental Status: He is alert and oriented to person, place, and time.  Psychiatric:        Mood and Affect: Mood normal.        Behavior: Behavior normal.     ED Results / Procedures / Treatments   Labs (all labs ordered are listed, but only abnormal results are displayed) Labs Reviewed  URINALYSIS, ROUTINE W REFLEX MICROSCOPIC - Abnormal; Notable for the following components:      Result Value   Color, Urine ORANGE (*)    APPearance CLOUDY (*)    Hgb urine dipstick LARGE (*)    Protein, ur >300 (*)    Leukocytes,Ua LARGE (*)    RBC / HPF >50 (*)    WBC, UA >50 (*)    All other components within normal limits  URINE CULTURE    EKG None  Radiology No results found.  Procedures Procedures   Medications Ordered in ED Medications  cephALEXin (KEFLEX) capsule 500 mg (has no administration in time range)    ED Course  I have reviewed the triage vital signs and the nursing notes.  Pertinent labs & imaging results that were available during my care of the patient were reviewed by me and considered in my medical decision making (see chart for details).    MDM Rules/Calculators/A&P                           Nurses changed foley without difficulty.    Pt does have a UTI.  Last urine culture in Feb  showed   DIPHTHEROIDS CORYNEBACTERIUM SPECIES and no susceptibility testing was done.    I will start pt on keflex.  Urine culture pending.  Pt  is stable for d/c.  Return if worse.  F/u with pcp.  Final Clinical Impression(s) / ED Diagnoses Final diagnoses:  Foley catheter problem, initial encounter ([place])  Acute cystitis without hematuria    Rx / DC Orders ED Discharge Orders         Ordered    cephALEXin (KEFLEX) 500 MG capsule  4 times daily        04/13/20 1543           Isla Pence, MD 04/13/20 1544

## 2020-04-13 NOTE — ED Triage Notes (Signed)
Patient reports to the ER for a report of catheter leaking. Patient reports this has happened in the past. Patient reports he has had a chronic foley catheter. Reports a 16 french catheter. States the foley is leaking from the urethra area. Patient reports he recently saw [facility] u[facility] to see if he could have the catheter removed, but has not been notified of this yet.

## 2020-04-14 LAB — URINE CULTURE: Culture: <10000 — AB

## 2020-05-06 ENCOUNTER — Encounter (HOSPITAL_BASED_OUTPATIENT_CLINIC_OR_DEPARTMENT_OTHER): Payer: Self-pay

## 2020-05-06 ENCOUNTER — Emergency Department (HOSPITAL_BASED_OUTPATIENT_CLINIC_OR_DEPARTMENT_OTHER)
Admission: EM | Admit: 2020-05-06 | Discharge: 2020-05-06 | Disposition: A | Discharge status: Home / Self Care | Payer: Medicare Other | Attending: Emergency Medicine | Admitting: Emergency Medicine

## 2020-05-06 ENCOUNTER — Other Ambulatory Visit: Payer: Self-pay

## 2020-05-06 DIAGNOSIS — R319 Hematuria, unspecified: Secondary | ICD-10-CM

## 2020-05-06 DIAGNOSIS — R300 Dysuria: Secondary | ICD-10-CM | POA: Diagnosis present

## 2020-05-06 DIAGNOSIS — I10 Essential (primary) hypertension: Secondary | ICD-10-CM | POA: Diagnosis not present

## 2020-05-06 DIAGNOSIS — Z7982 Long term (current) use of aspirin: Secondary | ICD-10-CM | POA: Diagnosis not present

## 2020-05-06 DIAGNOSIS — N390 Urinary tract infection, site not specified: Secondary | ICD-10-CM | POA: Diagnosis not present

## 2020-05-06 DIAGNOSIS — Z79899 Other long term (current) drug therapy: Secondary | ICD-10-CM | POA: Diagnosis not present

## 2020-05-06 LAB — URINALYSIS, ROUTINE W REFLEX MICROSCOPIC
Bilirubin Urine: NEGATIVE
Glucose, UA: NEGATIVE mg/dL
Ketones, ur: NEGATIVE mg/dL
Nitrite: NEGATIVE
Protein, ur: 30 mg/dL — AB
Specific Gravity, Urine: 1.029 (ref 1.005–1.030)
pH: 6.5 (ref 5.0–8.0)

## 2020-05-06 LAB — COMPREHENSIVE METABOLIC PANEL
ALT: 14 U/L (ref 0–44)
AST: 13 U/L — ABNORMAL LOW (ref 15–41)
Albumin: 4.1 g/dL (ref 3.5–5.0)
Alkaline Phosphatase: 39 U/L (ref 38–126)
Anion gap: 11 (ref 5–15)
BUN: 15 mg/dL (ref 8–23)
CO2: 26 mmol/L (ref 22–32)
Calcium: 9 mg/dL (ref 8.9–10.3)
Chloride: 106 mmol/L (ref 98–111)
Creatinine, Ser: 0.81 mg/dL (ref 0.61–1.24)
GFR, Estimated: >60 mL/min (ref >60)
Glucose, Bld: 91 mg/dL (ref 70–99)
Potassium: 3.8 mmol/L (ref 3.5–5.1)
Sodium: 143 mmol/L (ref 135–145)
Total Bilirubin: 0.7 mg/dL (ref 0.3–1.2)
Total Protein: 6.5 g/dL (ref 6.5–8.1)

## 2020-05-06 LAB — CBC
HCT: 43 % (ref 39.0–52.0)
Hemoglobin: 14.7 g/dL (ref 13.0–17.0)
MCH: 29.6 pg (ref 26.0–34.0)
MCHC: 34.2 g/dL (ref 30.0–36.0)
MCV: 86.5 fL (ref 80.0–100.0)
Platelets: 212 10*3/uL (ref 150–400)
RBC: 4.97 MIL/uL (ref 4.22–5.81)
RDW: 12.1 % (ref 11.5–15.5)
WBC: 5.8 10*3/uL (ref 4.0–10.5)
nRBC: 0 % (ref 0.0–0.2)

## 2020-05-06 MED ORDER — CEPHALEXIN 500 MG PO CAPS: 500.0000 mg | ORAL_CAPSULE | Freq: Four times a day (QID) | ORAL | 0 refills | Status: AC

## 2020-05-06 NOTE — Discharge Instructions (Addendum)
Take antibiotic.  Schedule an appointment with your urologist to be seen later this week.  Also recommend follow-up with your primary doctor.  Return to ER if you develop vomiting, fever or other new concerning symptom.

## 2020-05-06 NOTE — ED Triage Notes (Signed)
Pt reports having urinary catheter with on leg and having a little discomfort and reports urine is brown and foul odor.

## 2020-05-06 NOTE — ED Provider Notes (Signed)
[facility] EMERGENCY DEPT Provider Note   CSN: [id number] Arrival date & time: 05/06/20  1816     History Chief Complaint  Patient presents with  . Dysuria    Bryan Robertson is a 70 y.o. male.  Presented to ER with concern for foul-smelling urine.  States that he also noted slight discomfort with urination.  Urine also appears more dark.  Not having any abdominal pain, no nausea or vomiting.  No fevers.  States that he has a chronic indwelling Foley catheter due to urinary retention.  Is followed by [facility] urology.  HPI     Past Medical History:  Diagnosis Date  . Chronic indwelling Foley catheter   . High cholesterol   . Hypertension   . Urinary retention     Patient Active Problem List   Diagnosis Date Noted  . Right leg weakness 11/05/2019  . Essential hypertension 11/05/2019  . Hyperlipidemia 11/05/2019  . BPH (benign prostatic hyperplasia) 11/05/2019    History reviewed. No pertinent surgical history.     History reviewed. No pertinent family history.  Social History   Tobacco Use  . Smoking status: Never Smoker  . Smokeless tobacco: Never Used  Vaping Use  . Vaping Use: Never used  Substance Use Topics  . Alcohol use: Not Currently  . Drug use: Not Currently    Home Medications Prior to Admission medications   Medication Sig Start Date End Date Taking? Authorizing Provider  atorvastatin (LIPITOR) 20 MG tablet Take 20 mg by mouth at bedtime.  10/21/19   [provider]  brimonidine (ALPHAGAN) 0.2 % ophthalmic solution 1 drop 3 (three) times daily. 09/04/19   [provider]  buPROPion (WELLBUTRIN SR) 150 MG 12 hr tablet  12/11/19   [provider]  carvedilol (COREG) 12.5 MG tablet Take 1 tablet (12.5 mg total) by mouth 2 (two) times daily. 01/30/20   Donato Heinz, MD  cephALEXin (KEFLEX) 500 MG capsule Take 1 capsule (500 mg total) by mouth 4 (four) times daily for 7 days. 05/06/20 05/13/20  Lucrezia Starch, MD  clonazePAM (KLONOPIN) 0.5 MG tablet Take 0.5 mg by mouth daily as needed. 12/11/19   [provider]  [employer] ASPIRIN 325 MG tablet Take 325 mg by mouth at bedtime.  10/03/19   [provider]  Diroximel Fumarate 231 MG CPDR Take 1 capsule by mouth twice daily for the first 7 days. Then, increase to 2 capsules by mouth twice daily thereafter. 12/05/19   [provider]  dorzolamide-timolol (COSOPT) 22.3-6.8 MG/ML ophthalmic solution Place 1 drop into both eyes 2 (two) times daily.  09/21/19   [provider]  gabapentin (NEURONTIN) 300 MG capsule Take 300 mg by mouth 3 (three) times daily. 12/16/19   [provider]  hydrochlorothiazide (HYDRODIURIL) 12.5 MG tablet Take by mouth.    [provider]  polyethylene glycol (MIRALAX / GLYCOLAX) 17 g packet Take 17 g by mouth daily.    [provider]  RHOPRESSA 0.02 % SOLN Place 1 drop into both eyes at bedtime.  08/04/19   [provider]  tamsulosin (FLOMAX) 0.4 MG CAPS capsule Take 0.8 mg by mouth at bedtime. 09/29/19   [provider]  traZODone (DESYREL) 50 MG tablet  12/11/19   [provider]    Allergies    Doxycycline  Review of Systems   Review of Systems  Constitutional: Negative for chills and fever.  HENT: Negative for ear pain and sore throat.  Eyes: Negative for pain and visual disturbance.  Respiratory: Negative for cough and shortness of breath.   Cardiovascular: Negative for chest pain and palpitations.  Gastrointestinal: Negative for abdominal pain and vomiting.  Genitourinary: Positive for dysuria. Negative for hematuria.  Musculoskeletal: Negative for arthralgias and back pain.  Skin: Negative for color change and rash.  Neurological: Negative for seizures and syncope.  All other systems reviewed and are negative.   Physical Exam Updated Vital Signs BP (!) 144/72   Pulse 61   Temp 98.2 F (36.8 C) (Oral)   Resp 18   Ht 6' (1.829  m)   Wt 77.1 kg   SpO2 100%   BMI 23.06 kg/m   Physical Exam Vitals and nursing note reviewed.  Constitutional:      Appearance: He is well-developed.  HENT:     Head: Normocephalic and atraumatic.  Eyes:     Conjunctiva/sclera: Conjunctivae normal.  Cardiovascular:     Rate and Rhythm: Normal rate and regular rhythm.     Heart sounds: No murmur heard.   Pulmonary:     Effort: Pulmonary effort is normal. No respiratory distress.     Breath sounds: Normal breath sounds.  Abdominal:     Palpations: Abdomen is soft.     Tenderness: There is no abdominal tenderness.  Musculoskeletal:     Cervical back: Neck supple.  Skin:    General: Skin is warm and dry.  Neurological:     General: No focal deficit present.     Mental Status: He is alert.  Psychiatric:        Mood and Affect: Mood normal.        Behavior: Behavior normal.     ED Results / Procedures / Treatments   Labs (all labs ordered are listed, but only abnormal results are displayed) Labs Reviewed  URINALYSIS, ROUTINE W REFLEX MICROSCOPIC - Abnormal; Notable for the following components:      Result Value   APPearance CLOUDY (*)    Hgb urine dipstick LARGE (*)    Protein, ur 30 (*)    Leukocytes,Ua LARGE (*)    Bacteria, UA RARE (*)    All other components within normal limits  COMPREHENSIVE METABOLIC PANEL - Abnormal; Notable for the following components:   AST 13 (*)    All other components within normal limits  CBC    EKG None  Radiology No results found.  Procedures Procedures   Medications Ordered in ED Medications - No data to display  ED Course  I have reviewed the triage vital signs and the nursing notes.  Pertinent labs & imaging results that were available during my care of the patient were reviewed by me and considered in my medical decision making (see chart for details).    MDM Rules/Calculators/A&P                         70 year old gentleman presenting to ER with concern  for foul-smelling urine.  On exam he is well-appearing in no distress.  Basic labs are stable.  UA concerning for UTI.  Will start a course of antibiotics.  Recommend he follow-up with his urologist and primary care doctor.  After the discussed management above, the patient was determined to be safe for discharge.  The patient was in agreement with this plan and all questions regarding their care were answered.  ED return precautions were discussed and the patient will return to the ED with any  significant worsening of condition.   Final Clinical Impression(s) / ED Diagnoses Final diagnoses:  Urinary tract infection with hematuria, site unspecified    Rx / DC Orders ED Discharge Orders         Ordered    cephALEXin (KEFLEX) 500 MG capsule  4 times daily        05/06/20 1954           Lucrezia Starch, MD 05/06/20 1958

## 2020-05-12 ENCOUNTER — Encounter (HOSPITAL_BASED_OUTPATIENT_CLINIC_OR_DEPARTMENT_OTHER): Payer: Self-pay | Admitting: Emergency Medicine

## 2020-05-12 ENCOUNTER — Emergency Department (HOSPITAL_BASED_OUTPATIENT_CLINIC_OR_DEPARTMENT_OTHER): Payer: No Typology Code available for payment source

## 2020-05-12 ENCOUNTER — Other Ambulatory Visit: Payer: Self-pay

## 2020-05-12 ENCOUNTER — Emergency Department (HOSPITAL_BASED_OUTPATIENT_CLINIC_OR_DEPARTMENT_OTHER)
Admission: EM | Admit: 2020-05-12 | Discharge: 2020-05-12 | Disposition: A | Discharge status: Home / Self Care | Payer: No Typology Code available for payment source | Attending: Emergency Medicine | Admitting: Emergency Medicine

## 2020-05-12 DIAGNOSIS — N281 Cyst of kidney, acquired: Secondary | ICD-10-CM | POA: Insufficient documentation

## 2020-05-12 DIAGNOSIS — R1030 Lower abdominal pain, unspecified: Secondary | ICD-10-CM | POA: Diagnosis present

## 2020-05-12 DIAGNOSIS — Z79899 Other long term (current) drug therapy: Secondary | ICD-10-CM | POA: Insufficient documentation

## 2020-05-12 DIAGNOSIS — Z7982 Long term (current) use of aspirin: Secondary | ICD-10-CM | POA: Diagnosis not present

## 2020-05-12 DIAGNOSIS — K5900 Constipation, unspecified: Secondary | ICD-10-CM | POA: Insufficient documentation

## 2020-05-12 DIAGNOSIS — I10 Essential (primary) hypertension: Secondary | ICD-10-CM | POA: Diagnosis not present

## 2020-05-12 LAB — CBC WITH DIFFERENTIAL/PLATELET
Abs Immature Granulocytes: 0.01 10*3/uL (ref 0.00–0.07)
Basophils Absolute: 0 10*3/uL (ref 0.0–0.1)
Basophils Relative: 0 %
Eosinophils Absolute: 0.1 10*3/uL (ref 0.0–0.5)
Eosinophils Relative: 1 %
HCT: 44.4 % (ref 39.0–52.0)
Hemoglobin: 14.8 g/dL (ref 13.0–17.0)
Immature Granulocytes: 0 %
Lymphocytes Relative: 25 %
Lymphs Abs: 1.3 10*3/uL (ref 0.7–4.0)
MCH: 29.4 pg (ref 26.0–34.0)
MCHC: 33.3 g/dL (ref 30.0–36.0)
MCV: 88.3 fL (ref 80.0–100.0)
Monocytes Absolute: 0.4 10*3/uL (ref 0.1–1.0)
Monocytes Relative: 7 %
Neutro Abs: 3.3 10*3/uL (ref 1.7–7.7)
Neutrophils Relative %: 67 %
Platelets: 226 10*3/uL (ref 150–400)
RBC: 5.03 MIL/uL (ref 4.22–5.81)
RDW: 12 % (ref 11.5–15.5)
WBC: 5 10*3/uL (ref 4.0–10.5)
nRBC: 0 % (ref 0.0–0.2)

## 2020-05-12 LAB — COMPREHENSIVE METABOLIC PANEL
ALT: 12 U/L (ref 0–44)
AST: 12 U/L — ABNORMAL LOW (ref 15–41)
Albumin: 4.1 g/dL (ref 3.5–5.0)
Alkaline Phosphatase: 38 U/L (ref 38–126)
Anion gap: 7 (ref 5–15)
BUN: 12 mg/dL (ref 8–23)
CO2: 31 mmol/L (ref 22–32)
Calcium: 9.3 mg/dL (ref 8.9–10.3)
Chloride: 105 mmol/L (ref 98–111)
Creatinine, Ser: 0.79 mg/dL (ref 0.61–1.24)
GFR, Estimated: >60 mL/min (ref >60)
Glucose, Bld: 125 mg/dL — ABNORMAL HIGH (ref 70–99)
Potassium: 3.6 mmol/L (ref 3.5–5.1)
Sodium: 143 mmol/L (ref 135–145)
Total Bilirubin: 0.6 mg/dL (ref 0.3–1.2)
Total Protein: 6.4 g/dL — ABNORMAL LOW (ref 6.5–8.1)

## 2020-05-12 LAB — URINALYSIS, ROUTINE W REFLEX MICROSCOPIC
Bilirubin Urine: NEGATIVE
Glucose, UA: NEGATIVE mg/dL
Ketones, ur: NEGATIVE mg/dL
Nitrite: NEGATIVE
Protein, ur: 30 mg/dL — AB
Specific Gravity, Urine: 1.019 (ref 1.005–1.030)
pH: 8 (ref 5.0–8.0)

## 2020-05-12 MED ORDER — DOCUSATE SODIUM 100 MG PO CAPS: 100.0000 mg | ORAL_CAPSULE | Freq: Two times a day (BID) | ORAL | 0 refills | Status: AC

## 2020-05-12 MED ORDER — POLYETHYLENE GLYCOL 3350 17 G PO PACK: 17.0000 g | PACK | Freq: Two times a day (BID) | ORAL | 0 refills | Status: DC

## 2020-05-12 MED ORDER — SIMETHICONE 40 MG/0.6ML PO SUSP (UNIT DOSE)
40.0000 mg | Freq: Once | ORAL | Status: AC
  Administered 2020-05-12: 40 mg via ORAL
  Filled 2020-05-12: qty 0.6

## 2020-05-12 MED ORDER — SODIUM CHLORIDE 0.9 % IV SOLN
1.0000 g | Freq: Once | INTRAVENOUS | Status: AC
  Administered 2020-05-12: 1 g via INTRAVENOUS
  Filled 2020-05-12: qty 10

## 2020-05-12 MED ORDER — ACETAMINOPHEN 325 MG PO TABS
650.0000 mg | ORAL_TABLET | Freq: Once | ORAL | Status: AC
  Administered 2020-05-12: 650 mg via ORAL
  Filled 2020-05-12: qty 2

## 2020-05-12 MED ORDER — KETOROLAC TROMETHAMINE 30 MG/ML IJ SOLN
15.0000 mg | Freq: Once | INTRAMUSCULAR | Status: AC
  Administered 2020-05-12: 15 mg via INTRAVENOUS
  Filled 2020-05-12: qty 1

## 2020-05-12 NOTE — ED Provider Notes (Signed)
[facility] EMERGENCY DEPT Provider Note   CSN: [id number] Arrival date & time: 05/12/20  9449     History Chief Complaint  Patient presents with  . Abdominal Pain    Bryan Robertson is a 70 y.o. male.  The history is provided by the patient.  Abdominal Pain Pain location:  Suprapubic Pain quality: dull   Pain radiates to:  Does not radiate Pain severity:  Moderate Onset quality:  Gradual Duration:  1 day Timing:  Constant Progression:  Unchanged Chronicity:  New Context: laxative use   Context: not sick contacts and not trauma   Relieved by:  Nothing Worsened by:  Nothing Ineffective treatments:  None tried Associated symptoms: constipation   Associated symptoms: no anorexia, no belching, no chest pain, no chills, no cough, no fever, no hematuria, no nausea and no vomiting   Risk factors: being elderly   Risk factors: no alcohol abuse   Patient with UTIs with chronic foley currently on Keflex for UTI presents with lower abdominal pain and constipation.  No f/c/r.  No n/v/d.       Past Medical History:  Diagnosis Date  . Chronic indwelling Foley catheter   . High cholesterol   . Hypertension   . Urinary retention     Patient Active Problem List   Diagnosis Date Noted  . Right leg weakness 11/05/2019  . Essential hypertension 11/05/2019  . Hyperlipidemia 11/05/2019  . BPH (benign prostatic hyperplasia) 11/05/2019    History reviewed. No pertinent surgical history.     History reviewed. No pertinent family history.  Social History   Tobacco Use  . Smoking status: Never Smoker  . Smokeless tobacco: Never Used  Vaping Use  . Vaping Use: Never used  Substance Use Topics  . Alcohol use: Not Currently  . Drug use: Not Currently    Home Medications Prior to Admission medications   Medication Sig Start Date End Date Taking? Authorizing Provider  atorvastatin (LIPITOR) 20 MG tablet Take 20 mg by mouth at bedtime.  10/21/19   [provider]  brimonidine (ALPHAGAN) 0.2 % ophthalmic solution 1 drop 3 (three) times daily. 09/04/19   [provider]  buPROPion (WELLBUTRIN SR) 150 MG 12 hr tablet  12/11/19   [provider]  carvedilol (COREG) 12.5 MG tablet Take 1 tablet (12.5 mg total) by mouth 2 (two) times daily. 01/30/20   Donato Heinz, MD  cephALEXin (KEFLEX) 500 MG capsule Take 1 capsule (500 mg total) by mouth 4 (four) times daily for 7 days. 05/06/20 05/13/20  Lucrezia Starch, MD  clonazePAM (KLONOPIN) 0.5 MG tablet Take 0.5 mg by mouth daily as needed. 12/11/19   [provider]  [facility] ASPIRIN 325 MG tablet Take 325 mg by mouth at bedtime.  10/03/19   [provider]  Diroximel Fumarate 231 MG CPDR Take 1 capsule by mouth twice daily for the first 7 days. Then, increase to 2 capsules by mouth twice daily thereafter. 12/05/19   [provider]  dorzolamide-timolol (COSOPT) 22.3-6.8 MG/ML ophthalmic solution Place 1 drop into both eyes 2 (two) times daily.  09/21/19   [provider]  gabapentin (NEURONTIN) 300 MG capsule Take 300 mg by mouth 3 (three) times daily. 12/16/19   [provider]  hydrochlorothiazide (HYDRODIURIL) 12.5 MG tablet Take by mouth.    [provider]  polyethylene glycol (MIRALAX / GLYCOLAX) 17 g packet Take 17 g by mouth daily.    [provider]  RHOPRESSA 0.02 %  SOLN Place 1 drop into both eyes at bedtime.  08/04/19   [provider]  tamsulosin (FLOMAX) 0.4 MG CAPS capsule Take 0.8 mg by mouth at bedtime. 09/29/19   [provider]  traZODone (DESYREL) 50 MG tablet  12/11/19   [provider]    Allergies    Doxycycline  Review of Systems   Review of Systems  Constitutional: Negative for chills and fever.  HENT: Negative for congestion.   Eyes: Negative for visual disturbance.  Respiratory: Negative for cough.   Cardiovascular: Negative for chest pain.  Gastrointestinal:  Positive for abdominal pain and constipation. Negative for anorexia, nausea and vomiting.  Genitourinary: Negative for hematuria.  Musculoskeletal: Negative for arthralgias.  Skin: Negative for rash.  Neurological: Negative for facial asymmetry.  Psychiatric/Behavioral: Negative for agitation.    Physical Exam Updated Vital Signs BP (!) 158/95 (BP Location: Right Arm)   Pulse 61   Temp 97.6 F (36.4 C) (Oral)   Resp 18   Wt 79.5 kg   SpO2 99%   BMI 23.77 kg/m   Physical Exam Vitals and nursing note reviewed.  Constitutional:      General: He is not in acute distress.    Appearance: Normal appearance.  HENT:     Head: Normocephalic and atraumatic.     Nose: Nose normal.  Eyes:     Conjunctiva/sclera: Conjunctivae normal.     Pupils: Pupils are equal, round, and reactive to light.  Cardiovascular:     Rate and Rhythm: Normal rate and regular rhythm.     Pulses: Normal pulses.     Heart sounds: Normal heart sounds.  Pulmonary:     Effort: Pulmonary effort is normal.     Breath sounds: Normal breath sounds.  Abdominal:     General: Abdomen is flat. Bowel sounds are normal.     Palpations: Abdomen is soft.     Tenderness: There is no abdominal tenderness. There is no rebound.     Hernia: No hernia is present.  Musculoskeletal:        General: Normal range of motion.     Cervical back: Normal range of motion and neck supple.  Skin:    General: Skin is warm and dry.     Capillary Refill: Capillary refill takes less than 2 seconds.  Neurological:     General: No focal deficit present.     Mental Status: He is alert.     Deep Tendon Reflexes: Reflexes normal.  Psychiatric:        Mood and Affect: Mood normal.        Behavior: Behavior normal.     ED Results / Procedures / Treatments   Labs (all labs ordered are listed, but only abnormal results are displayed) Labs Reviewed  URINE CULTURE  CBC WITH DIFFERENTIAL/PLATELET  COMPREHENSIVE METABOLIC PANEL   URINALYSIS, ROUTINE W REFLEX MICROSCOPIC    EKG None  Radiology No results found.  Procedures Procedures   Medications Ordered in ED Medications  cefTRIAXone (ROCEPHIN) 1 g in sodium chloride 0.9 % 100 mL IVPB (has no administration in time range)  ketorolac (TORADOL) 30 MG/ML injection 15 mg (15 mg Intravenous Given 05/12/20 0247)  acetaminophen (TYLENOL) tablet 650 mg (650 mg Oral Given 05/12/20 0247)    ED Course  I have reviewed the triage vital signs and the nursing notes.  Pertinent labs & imaging results that were available during my care of the patient were reviewed by me and considered in my medical  decision making (see chart for details).   Continued keflex. 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  No signs of sepsis, urine improving, continue keflex.  Culture sent.  Will start colace and Miralax BID and have suggested glycerin suppository from below.  Patient informed of need to follow up with urology following his renal cyst.    Bryan Robertson was evaluated in Emergency Department on 05/12/2020 for the symptoms described in the history of present illness. He was evaluated in the context of the global COVID-19 pandemic, which necessitated consideration that the patient might be at risk for infection with the SARS-CoV-2 virus that causes COVID-19. Institutional protocols and algorithms that pertain to the evaluation of patients at risk for COVID-19 are in a state of rapid change based on information released by regulatory bodies including the CDC and federal and state organizations. These policies and algorithms were followed during the patient's care in the ED.  Final Clinical Impression(s) / ED Diagnoses Return for intractable cough, coughing up blood, fevers >100.4 unrelieved by medication, shortness of breath, intractable vomiting, chest pain, shortness of breath, weakness, numbness, changes in speech, facial asymmetry, abdominal pain, passing out,  Inability to tolerate liquids or food, cough, altered mental status or any concerns. No signs of systemic illness or infection. The patient is nontoxic-appearing on exam and vital signs are within normal limits.  I have reviewed the triage vital signs and the nursing notes. Pertinent labs & imaging results that were available during my care of the patient were reviewed by me and considered in my medical decision making (see chart for details). After history, exam, and medical workup I feel the patient has been appropriately medically screened and is safe for discharge home. Pertinent diagnoses were discussed with the patient. Patient was given return precautions.   Harmoni Lucus, MD 05/12/20 0330

## 2020-05-12 NOTE — ED Triage Notes (Addendum)
Pt is present to the ED for lower abd pain. Denies N/V/D. Pt states foley has been draining and he emptied bag PTA. Foley last changed 04/28/20. Hx of constipation.

## 2020-05-13 LAB — URINE CULTURE: Culture: NO GROWTH

## 2020-05-27 ENCOUNTER — Other Ambulatory Visit: Payer: Self-pay

## 2020-05-27 ENCOUNTER — Encounter (HOSPITAL_BASED_OUTPATIENT_CLINIC_OR_DEPARTMENT_OTHER): Payer: Self-pay

## 2020-05-27 DIAGNOSIS — I10 Essential (primary) hypertension: Secondary | ICD-10-CM | POA: Insufficient documentation

## 2020-05-27 DIAGNOSIS — Z79899 Other long term (current) drug therapy: Secondary | ICD-10-CM | POA: Diagnosis not present

## 2020-05-27 DIAGNOSIS — N3091 Cystitis, unspecified with hematuria: Secondary | ICD-10-CM | POA: Insufficient documentation

## 2020-05-27 DIAGNOSIS — R319 Hematuria, unspecified: Secondary | ICD-10-CM | POA: Diagnosis present

## 2020-05-27 DIAGNOSIS — Z7982 Long term (current) use of aspirin: Secondary | ICD-10-CM | POA: Insufficient documentation

## 2020-05-27 NOTE — ED Triage Notes (Signed)
Patients presnts to ED today with Blood in Urine Bag.  Patient has a Chronic Foley Bag which he noticed while emptying it, that it had blood in it.   No Pain or recent Catheter Change. No Urinary Symptoms.

## 2020-05-28 ENCOUNTER — Emergency Department (HOSPITAL_BASED_OUTPATIENT_CLINIC_OR_DEPARTMENT_OTHER)
Admission: EM | Admit: 2020-05-28 | Discharge: 2020-05-28 | Disposition: A | Discharge status: Home / Self Care | Payer: No Typology Code available for payment source | Attending: Emergency Medicine | Admitting: Emergency Medicine

## 2020-05-28 DIAGNOSIS — N3091 Cystitis, unspecified with hematuria: Secondary | ICD-10-CM

## 2020-05-28 LAB — CBC WITH DIFFERENTIAL/PLATELET
Abs Immature Granulocytes: 0.01 10*3/uL (ref 0.00–0.07)
Basophils Absolute: 0 10*3/uL (ref 0.0–0.1)
Basophils Relative: 0 %
Eosinophils Absolute: 0 10*3/uL (ref 0.0–0.5)
Eosinophils Relative: 1 %
HCT: 40.1 % (ref 39.0–52.0)
Hemoglobin: 13.5 g/dL (ref 13.0–17.0)
Immature Granulocytes: 0 %
Lymphocytes Relative: 22 %
Lymphs Abs: 1.1 10*3/uL (ref 0.7–4.0)
MCH: 29.4 pg (ref 26.0–34.0)
MCHC: 33.7 g/dL (ref 30.0–36.0)
MCV: 87.4 fL (ref 80.0–100.0)
Monocytes Absolute: 0.4 10*3/uL (ref 0.1–1.0)
Monocytes Relative: 8 %
Neutro Abs: 3.3 10*3/uL (ref 1.7–7.7)
Neutrophils Relative %: 69 %
Platelets: 198 10*3/uL (ref 150–400)
RBC: 4.59 MIL/uL (ref 4.22–5.81)
RDW: 12.3 % (ref 11.5–15.5)
WBC: 4.9 10*3/uL (ref 4.0–10.5)
nRBC: 0 % (ref 0.0–0.2)

## 2020-05-28 LAB — URINALYSIS, MICROSCOPIC (REFLEX)
Bacteria, UA: NONE SEEN
RBC / HPF: >50 RBC/hpf (ref 0–5)
Squamous Epithelial / HPF: NONE SEEN (ref 0–5)
WBC, UA: >50 WBC/hpf (ref 0–5)

## 2020-05-28 LAB — BASIC METABOLIC PANEL
Anion gap: <5 — ABNORMAL LOW (ref 5–15)
BUN: 16 mg/dL (ref 8–23)
CO2: 33 mmol/L — ABNORMAL HIGH (ref 22–32)
Calcium: 9.3 mg/dL (ref 8.9–10.3)
Chloride: 106 mmol/L (ref 98–111)
Creatinine, Ser: 0.87 mg/dL (ref 0.61–1.24)
GFR, Estimated: >60 mL/min (ref >60)
Glucose, Bld: 111 mg/dL — ABNORMAL HIGH (ref 70–99)
Potassium: 4 mmol/L (ref 3.5–5.1)
Sodium: 141 mmol/L (ref 135–145)

## 2020-05-28 LAB — URINALYSIS, ROUTINE W REFLEX MICROSCOPIC: Specific Gravity, Urine: <1.005 — ABNORMAL LOW (ref 1.005–1.030)

## 2020-05-28 MED ORDER — CEFUROXIME AXETIL 500 MG PO TABS: 500.0000 mg | ORAL_TABLET | Freq: Two times a day (BID) | ORAL | 0 refills | Status: AC

## 2020-05-28 MED ORDER — SODIUM CHLORIDE 0.9 % IV SOLN
1.0000 g | Freq: Once | INTRAVENOUS | Status: AC
  Administered 2020-05-28: 1 g via INTRAVENOUS
  Filled 2020-05-28: qty 10

## 2020-05-28 MED ORDER — CEFUROXIME AXETIL 500 MG PO TABS: 500.0000 mg | ORAL_TABLET | Freq: Two times a day (BID) | ORAL | 0 refills | Status: DC

## 2020-05-28 MED ORDER — LIDOCAINE HCL URETHRAL/MUCOSAL 2 % EX GEL
1.0000 "application " | Freq: Once | CUTANEOUS | Status: AC
  Administered 2020-05-28: 1 via URETHRAL
  Filled 2020-05-28: qty 11

## 2020-05-28 NOTE — ED Provider Notes (Signed)
[facility] Provider Note  CSN: [id number] Arrival date & time: 05/27/20 2302    History Chief Complaint  Patient presents with  . Hematuria    HPI  Bryan Robertson is a 70 y.o. male with a history of urinary retention has had a chronic foley since October. Has been seen in the ED several times recently for problems with the foley, leaking, obstruction and UTI. This particular catheter has been in for a few weeks but tonight when he was emptying the leg bag he noted his urine was grossly bloody. He has not had any pain, fevers, nausea, vomiting or leaking. Just recently finished a course of keflex for UTI.    Past Medical History:  Diagnosis Date  . Chronic indwelling Foley catheter   . High cholesterol   . Hypertension   . Urinary retention     History reviewed. No pertinent surgical history.  No family history on file.  Social History   Tobacco Use  . Smoking status: Never Smoker  . Smokeless tobacco: Never Used  Vaping Use  . Vaping Use: Never used  Substance Use Topics  . Alcohol use: Not Currently  . Drug use: Not Currently     Home Medications Prior to Admission medications   Medication Sig Start Date End Date Taking? Authorizing Provider  cefUROXime (CEFTIN) 500 MG tablet Take 1 tablet (500 mg total) by mouth 2 (two) times daily with a meal for 7 days. 05/28/20 06/04/20 Yes Truddie Hidden, MD  atorvastatin (LIPITOR) 20 MG tablet Take 20 mg by mouth at bedtime.  10/21/19   [provider]  brimonidine (ALPHAGAN) 0.2 % ophthalmic solution 1 drop 3 (three) times daily. 09/04/19   [provider]  buPROPion (WELLBUTRIN SR) 150 MG 12 hr tablet  12/11/19   [provider]  carvedilol (COREG) 12.5 MG tablet Take 1 tablet (12.5 mg total) by mouth 2 (two) times daily. 01/30/20   Donato Heinz, MD  clonazePAM (KLONOPIN) 0.5 MG tablet Take 0.5 mg by mouth daily as needed. 12/11/19   [provider]   [facility] ASPIRIN 325 MG tablet Take 325 mg by mouth at bedtime.  10/03/19   [provider]  Diroximel Fumarate 231 MG CPDR Take 1 capsule by mouth twice daily for the first 7 days. Then, increase to 2 capsules by mouth twice daily thereafter. 12/05/19   [provider]  docusate sodium (COLACE) 100 MG capsule Take 1 capsule (100 mg total) by mouth every 12 (twelve) hours. 05/12/20   Palumbo, April, MD  dorzolamide-timolol (COSOPT) 22.3-6.8 MG/ML ophthalmic solution Place 1 drop into both eyes 2 (two) times daily.  09/21/19   [provider]  gabapentin (NEURONTIN) 300 MG capsule Take 300 mg by mouth 3 (three) times daily. 12/16/19   [provider]  hydrochlorothiazide (HYDRODIURIL) 12.5 MG tablet Take by mouth.    [provider]  polyethylene glycol (MIRALAX / GLYCOLAX) 17 g packet Take 17 g by mouth daily.    [provider]  polyethylene glycol (MIRALAX) 17 g packet Take 17 g by mouth 2 (two) times daily. 05/12/20   Palumbo, April, MD  RHOPRESSA 0.02 % SOLN Place 1 drop into both eyes at bedtime.  08/04/19   [provider]  tamsulosin (FLOMAX) 0.4 MG CAPS capsule Take 0.8 mg by mouth at bedtime. 09/29/19   [provider]  traZODone (DESYREL) 50 MG tablet  12/11/19   [provider]     Allergies  Doxycycline   Review of Systems   Review of Systems A comprehensive review of systems was completed and negative except as noted in HPI.    Physical Exam BP 135/62 (BP Location: Left Arm)   Pulse 61   Temp 97.8 F (36.6 C) (Oral)   Resp 16   Ht 6' (1.829 m)   Wt 79.4 kg   SpO2 100%   BMI 23.73 kg/m   Physical Exam Vitals and nursing note reviewed.  Constitutional:      Appearance: Normal appearance.  HENT:     Head: Normocephalic and atraumatic.     Nose: Nose normal.     Mouth/Throat:     Mouth: Mucous membranes are moist.  Eyes:     Extraocular Movements: Extraocular movements intact.      Conjunctiva/sclera: Conjunctivae normal.  Cardiovascular:     Rate and Rhythm: Normal rate.  Pulmonary:     Effort: Pulmonary effort is normal.     Breath sounds: Normal breath sounds.  Abdominal:     General: Abdomen is flat.     Palpations: Abdomen is soft.     Tenderness: There is no abdominal tenderness.  Genitourinary:    Comments: Foley in place, no leaking, bloody/cloudy urine in the bag Musculoskeletal:        General: No swelling. Normal range of motion.     Cervical back: Neck supple.  Skin:    General: Skin is warm and dry.  Neurological:     General: No focal deficit present.     Mental Status: He is alert.  Psychiatric:        Mood and Affect: Mood normal.      ED Results / Procedures / Treatments   Labs (all labs ordered are listed, but only abnormal results are displayed) Labs Reviewed  URINALYSIS, ROUTINE W REFLEX MICROSCOPIC - Abnormal; Notable for the following components:      Result Value   Color, Urine RED (*)    APPearance TURBID (*)    Specific Gravity, Urine <1.005 (*)    Glucose, UA   (*)    Value: TEST NOT REPORTED DUE TO COLOR INTERFERENCE OF URINE PIGMENT   Hgb urine dipstick   (*)    Value: TEST NOT REPORTED DUE TO COLOR INTERFERENCE OF URINE PIGMENT   Bilirubin Urine   (*)    Value: TEST NOT REPORTED DUE TO COLOR INTERFERENCE OF URINE PIGMENT   Ketones, ur   (*)    Value: TEST NOT REPORTED DUE TO COLOR INTERFERENCE OF URINE PIGMENT   Protein, ur   (*)    Value: TEST NOT REPORTED DUE TO COLOR INTERFERENCE OF URINE PIGMENT   Nitrite   (*)    Value: TEST NOT REPORTED DUE TO COLOR INTERFERENCE OF URINE PIGMENT   Leukocytes,Ua   (*)    Value: TEST NOT REPORTED DUE TO COLOR INTERFERENCE OF URINE PIGMENT   All other components within normal limits  BASIC METABOLIC PANEL - Abnormal; Notable for the following components:   CO2 33 (*)    Glucose, Bld 111 (*)    Anion gap <5 (*)    All other components within normal limits  URINE CULTURE   URINALYSIS, MICROSCOPIC (REFLEX)  CBC WITH DIFFERENTIAL/PLATELET    EKG None   Radiology No results found.  Procedures Procedures  Medications Ordered in the ED Medications  lidocaine (XYLOCAINE) 2 % jelly 1 application (1 application Urethral Given 05/28/20 0112)  cefTRIAXone (ROCEPHIN) 1 g in sodium chloride  0.9 % 100 mL IVPB (0 g Intravenous Stopped 05/28/20 0215)     MDM Rules/Calculators/A&P MDM  ED Course  I have reviewed the triage vital signs and the nursing notes.  Pertinent labs & imaging results that were available during my care of the patient were reviewed by me and considered in my medical decision making (see chart for details).  Clinical Course as of 05/28/20 0240  Tue May 28, 2020  0125 UA is grossly bloody. Micro with TNTC RBC and WBC. Will send for culture, may be colonized given his chronic foley but concern for active infection given new hematuria.  [CS]  1601 CBC is normal.  [CS]  0932 Most recent positive urine culture was CORYNEBACTERIUM SPECIES in Feb 2021. More recent urines have not been cultured or have been negative.  [CS]  0221 BMP is normal.  [CS]  0236 Patient remains non-toxic appearing. Will ask RN to irrigate foley to ensure no clots and plan discharge with Rx for Cefuroxime. Recommend he call his Urologist at [facility] for follow up.  [CS]    Clinical Course User Index [CS] Truddie Hidden, MD    Final Clinical Impression(s) / ED Diagnoses Final diagnoses:  Hemorrhagic cystitis    Rx / DC Orders ED Discharge Orders         Ordered    cefUROXime (CEFTIN) 500 MG tablet  2 times daily with meals        05/28/20 0239           Truddie Hidden, MD 05/28/20 [phone]

## 2020-05-29 LAB — URINE CULTURE: Culture: >=100000 — AB

## 2020-05-30 ENCOUNTER — Telehealth: Payer: Self-pay | Admitting: *Deleted

## 2020-05-30 NOTE — Telephone Encounter (Signed)
Post ED Visit - Positive Culture Follow-up  Culture report reviewed by antimicrobial stewardship pharmacist: [facility] Team []  Elenor Quinones, Pharm.D. []  Heide Guile, Pharm.D., BCPS AQ-ID []  Parks Neptune, Pharm.D., BCPS []  Alycia Rossetti, Pharm.D., BCPS []  [place], Pharm.D., BCPS, AAHIVP []  Legrand Como, Pharm.D., BCPS, AAHIVP []  Salome Arnt, PharmD, BCPS []  Johnnette Gourd, PharmD, BCPS []  Hughes Better, PharmD, BCPS []  Leeroy Cha, PharmD []  Laqueta Linden, PharmD, BCPS []  Albertina Parr, PharmD  [facility] Team []  Leodis Sias, PharmD []  Lindell Spar, PharmD []  Royetta Asal, PharmD []  Graylin Shiver, Rph []  Rema Fendt) Glennon Mac, PharmD []  Arlyn Dunning, PharmD []  Netta Cedars, PharmD []  Dia Sitter, PharmD []  Leone Haven, PharmD []  Gretta Arab, PharmD []  Theodis Shove, PharmD []  Peggyann Juba, PharmD []  Reuel Boom, PharmD   Positive urine culture Treated with Cefuroxime, organism sensitive to the same and no further patient follow-up is required at this time. Mercy Riding, PharmD  Harlon Flor Talley 05/30/2020, 11:07 AM

## 2020-06-13 ENCOUNTER — Other Ambulatory Visit: Payer: Self-pay

## 2020-06-13 ENCOUNTER — Encounter (HOSPITAL_BASED_OUTPATIENT_CLINIC_OR_DEPARTMENT_OTHER): Payer: Self-pay

## 2020-06-13 ENCOUNTER — Emergency Department (HOSPITAL_BASED_OUTPATIENT_CLINIC_OR_DEPARTMENT_OTHER)
Admission: EM | Admit: 2020-06-13 | Discharge: 2020-06-13 | Disposition: A | Discharge status: Home / Self Care | Payer: No Typology Code available for payment source | Attending: Emergency Medicine | Admitting: Emergency Medicine

## 2020-06-13 DIAGNOSIS — I10 Essential (primary) hypertension: Secondary | ICD-10-CM | POA: Diagnosis not present

## 2020-06-13 DIAGNOSIS — T83098A Other mechanical complication of other indwelling urethral catheter, initial encounter: Secondary | ICD-10-CM | POA: Diagnosis not present

## 2020-06-13 DIAGNOSIS — Z7982 Long term (current) use of aspirin: Secondary | ICD-10-CM | POA: Diagnosis not present

## 2020-06-13 DIAGNOSIS — Z79899 Other long term (current) drug therapy: Secondary | ICD-10-CM | POA: Diagnosis not present

## 2020-06-13 DIAGNOSIS — R300 Dysuria: Secondary | ICD-10-CM | POA: Diagnosis present

## 2020-06-13 DIAGNOSIS — N3000 Acute cystitis without hematuria: Secondary | ICD-10-CM | POA: Insufficient documentation

## 2020-06-13 DIAGNOSIS — T839XXA Unspecified complication of genitourinary prosthetic device, implant and graft, initial encounter: Secondary | ICD-10-CM

## 2020-06-13 LAB — URINALYSIS, ROUTINE W REFLEX MICROSCOPIC
Bilirubin Urine: NEGATIVE
Glucose, UA: NEGATIVE mg/dL
Ketones, ur: 15 mg/dL — AB
Nitrite: NEGATIVE
Specific Gravity, Urine: 1.017 (ref 1.005–1.030)
pH: 6 (ref 5.0–8.0)

## 2020-06-13 MED ORDER — LIDOCAINE HCL URETHRAL/MUCOSAL 2 % EX GEL
1.0000 "application " | Freq: Once | CUTANEOUS | Status: AC
  Administered 2020-06-13: 1 via URETHRAL
  Filled 2020-06-13: qty 11

## 2020-06-13 MED ORDER — CEPHALEXIN 500 MG PO CAPS: 500.0000 mg | ORAL_CAPSULE | Freq: Four times a day (QID) | ORAL | 0 refills | Status: AC

## 2020-06-13 NOTE — Discharge Instructions (Signed)
Your history and exam today are consistent with a Foley catheter problem.  After placement, your Foley issues improved however due to the smell and urinalysis, we will treat you for urinary tract infection.  A culture was sent  to make sure there is not any resistance.  I spoke with pharmacy who recommended Keflex which I ordered.  Please take this antibiotic for the next week and follow-up with your primary doctor.  If any symptoms change or worsen, please return to the nearest emergency department.

## 2020-06-13 NOTE — ED Provider Notes (Signed)
[facility] EMERGENCY DEPT Provider Note   CSN: [id number] Arrival date & time: 06/13/20  1638     History Chief Complaint  Patient presents with  . Foley Catheter Problem    Bryan Robertson is a 70 y.o. male.  The history is provided by the patient and medical records. No language interpreter was used.  Dysuria Presenting symptoms: dysuria   Presenting symptoms: no penile discharge, no penile pain and no swelling   Relieved by:  Nothing Worsened by:  Nothing Ineffective treatments:  None tried Associated symptoms: urinary incontinence (around foley)   Associated symptoms: no abdominal pain, no diarrhea, no fever, no flank pain, no groin pain, no nausea, no penile swelling, no scrotal swelling, no urinary frequency, no urinary hesitation and no vomiting        Past Medical History:  Diagnosis Date  . Chronic indwelling Foley catheter   . High cholesterol   . Hypertension   . Urinary retention     Patient Active Problem List   Diagnosis Date Noted  . Right leg weakness 11/05/2019  . Essential hypertension 11/05/2019  . Hyperlipidemia 11/05/2019  . BPH (benign prostatic hyperplasia) 11/05/2019    History reviewed. No pertinent surgical history.     History reviewed. No pertinent family history.  Social History   Tobacco Use  . Smoking status: Never Smoker  . Smokeless tobacco: Never Used  Vaping Use  . Vaping Use: Never used  Substance Use Topics  . Alcohol use: Not Currently  . Drug use: Not Currently    Home Medications Prior to Admission medications   Medication Sig Start Date End Date Taking? Authorizing Provider  atorvastatin (LIPITOR) 20 MG tablet Take 20 mg by mouth at bedtime.  10/21/19   [provider]  brimonidine (ALPHAGAN) 0.2 % ophthalmic solution 1 drop 3 (three) times daily. 09/04/19   [provider]  buPROPion (WELLBUTRIN SR) 150 MG 12 hr tablet  12/11/19   [provider]  carvedilol  (COREG) 12.5 MG tablet Take 1 tablet (12.5 mg total) by mouth 2 (two) times daily. 01/30/20   Donato Heinz, MD  clonazePAM (KLONOPIN) 0.5 MG tablet Take 0.5 mg by mouth daily as needed. 12/11/19   [provider]  [facility] ASPIRIN 325 MG tablet Take 325 mg by mouth at bedtime.  10/03/19   [provider]  Diroximel Fumarate 231 MG CPDR Take 1 capsule by mouth twice daily for the first 7 days. Then, increase to 2 capsules by mouth twice daily thereafter. 12/05/19   [provider]  docusate sodium (COLACE) 100 MG capsule Take 1 capsule (100 mg total) by mouth every 12 (twelve) hours. 05/12/20   Palumbo, April, MD  dorzolamide-timolol (COSOPT) 22.3-6.8 MG/ML ophthalmic solution Place 1 drop into both eyes 2 (two) times daily.  09/21/19   [provider]  gabapentin (NEURONTIN) 300 MG capsule Take 300 mg by mouth 3 (three) times daily. 12/16/19   [provider]  hydrochlorothiazide (HYDRODIURIL) 12.5 MG tablet Take by mouth.    [provider]  polyethylene glycol (MIRALAX / GLYCOLAX) 17 g packet Take 17 g by mouth daily.    [provider]  polyethylene glycol (MIRALAX) 17 g packet Take 17 g by mouth 2 (two) times daily. 05/12/20   Palumbo, April, MD  RHOPRESSA 0.02 % SOLN Place 1 drop into both eyes at bedtime.  08/04/19   [provider]  tamsulosin (FLOMAX) 0.4 MG CAPS capsule Take 0.8 mg by mouth at  bedtime. 09/29/19   [provider]  traZODone (DESYREL) 50 MG tablet  12/11/19   [provider]    Allergies    Doxycycline  Review of Systems   Review of Systems  Constitutional: Negative for chills, fatigue and fever.  HENT: Negative for congestion.   Respiratory: Negative for chest tightness and shortness of breath.   Cardiovascular: Negative for chest pain.  Gastrointestinal: Negative for abdominal pain, diarrhea, nausea and vomiting.  Genitourinary: Positive for bladder incontinence (around foley) and  dysuria. Negative for decreased urine volume, flank pain, frequency, hesitancy, penile discharge, penile pain, penile swelling and scrotal swelling.  Musculoskeletal: Negative for back pain.  Neurological: Negative for headaches.  Psychiatric/Behavioral: Negative for agitation.  All other systems reviewed and are negative.   Physical Exam Updated Vital Signs BP (!) 167/82   Pulse 64   Temp 98.6 F (37 C) (Oral)   Resp 18   Ht 6' (1.829 m)   Wt 79.4 kg   SpO2 99%   BMI 23.73 kg/m   Physical Exam Vitals and nursing note reviewed.  Constitutional:      Appearance: He is well-developed. He is not ill-appearing.  HENT:     Head: Normocephalic and atraumatic.     Nose: Nose normal. No congestion.     Mouth/Throat:     Mouth: Mucous membranes are moist.  Eyes:     Conjunctiva/sclera: Conjunctivae normal.  Cardiovascular:     Rate and Rhythm: Normal rate and regular rhythm.     Heart sounds: No murmur heard.   Pulmonary:     Effort: Pulmonary effort is normal. No respiratory distress.     Breath sounds: Normal breath sounds. No wheezing, rhonchi or rales.  Chest:     Chest wall: No tenderness.  Abdominal:     General: Abdomen is flat.     Palpations: Abdomen is soft.     Tenderness: There is no abdominal tenderness. There is no guarding or rebound.  Genitourinary:    Comments: Foley in place with urine around catheter Musculoskeletal:        General: No tenderness.     Cervical back: Neck supple.  Skin:    General: Skin is warm and dry.     Capillary Refill: Capillary refill takes less than 2 seconds.  Neurological:     General: No focal deficit present.     Mental Status: He is alert.     ED Results / Procedures / Treatments   Labs (all labs ordered are listed, but only abnormal results are displayed) Labs Reviewed  URINALYSIS, ROUTINE W REFLEX MICROSCOPIC - Abnormal; Notable for the following components:      Result Value   Hgb urine dipstick MODERATE (*)     Ketones, ur 15 (*)    Protein, ur TRACE (*)    Leukocytes,Ua MODERATE (*)    Bacteria, UA RARE (*)    All other components within normal limits  URINE CULTURE    EKG None  Radiology No results found.  Procedures Procedures   Medications Ordered in ED Medications  lidocaine (XYLOCAINE) 2 % jelly 1 application (1 application Urethral Given 06/13/20 1851)    ED Course  I have reviewed the triage vital signs and the nursing notes.  Pertinent labs & imaging results that were available during my care of the patient were reviewed by me and considered in my medical decision making (see chart for details).    MDM Rules/Calculators/A&P  Bryan Robertson is a 70 y.o. male with a past medical history significant for hypertension, hyperlipidemia, BPH with chronic Foley catheter use who recently completed antibiotics for urinary tract infection who presents with leakage of urine around his Foley catheter and foul-smelling urine recurrence.  He reports that he had his Foley changed yesterday and it has been leaking around it.  He is concerned as a problem with the catheter.  He denies any abdominal pain or chest pain.  No fevers or chills.  Reports his urine has been foul-smelling and he is concerned is infected again despite completing antibiotic recently.  He denies other complaints include no nausea, vomiting, constipation, diarrhea, or other complaints.  On exam, lungs are clear and chest is nontender.  Abdomen is nontender.  Patient well-appearing.  A chaperone was utilized and his Foley catheter was examined.  It is appearing to be secured in the penis however there is some leakage of urine around to the catheter.  After discussion with the patient, we will replace the catheter in case there is a problem with the Foley itself.  Foley was replaced and it appeared to show a problem with the Foley balloon that may have been overinflated.  After new Foley  placement, urine flowed without difficulty without any leakage.  Problem was solved.  We did send a urinalysis which shows evidence of urinary tract infection.  Previous culture showed multiple types of bacteria, I spoke with pharmacy who recommended Keflex based on previous cultures.  Patient agreed and was discharged home with new antibiotics to follow-up with his PCP as well as resolution of the leakage problem with his catheter tonight.  Family discharged in good condition.  Final Clinical Impression(s) / ED Diagnoses Final diagnoses:  Problem with Foley catheter, initial encounter ([place])  Acute cystitis without hematuria    Rx / DC Orders ED Discharge Orders         Ordered    cephALEXin (KEFLEX) 500 MG capsule  4 times daily        06/13/20 1936         Clinical Impression: 1. Problem with Foley catheter, initial encounter ([place])   2. Acute cystitis without hematuria     Disposition: Discharge  Condition: Good  I have discussed the results, Dx and Tx plan with the pt(& family if present). He/she/they expressed understanding and agree(s) with the plan. Discharge instructions discussed at great length. Strict return precautions discussed and pt &/or family have verbalized understanding of the instructions. No further questions at time of discharge.    Discharge Medication List as of 06/13/2020  7:37 PM    START taking these medications   Details  cephALEXin (KEFLEX) 500 MG capsule Take 1 capsule (500 mg total) by mouth 4 (four) times daily for 7 days., Starting Thu 06/13/2020, Until Thu 06/20/2020, Print        Follow Up: Delorise Shiner, [place] [address] [facility] [place] [id number] [phone]       Mckenize Mezera, Gwenyth Allegra, MD 06/13/20 2020

## 2020-06-13 NOTE — ED Triage Notes (Signed)
Indwelling foley replaced yesterday (63fr) and has been leaking around penis since.  Denies any pain.

## 2020-06-15 LAB — URINE CULTURE: Culture: NO GROWTH

## 2020-07-14 NOTE — Progress Notes (Deleted)
Cardiology Office Note:    Date:  07/14/2020   ID:  Bryan Robertson, DOB 04-21-1950, MRN [id number]  PCP:  Delorise Shiner, MD  Cardiologist:  None  Electrophysiologist:  None   Referring MD: Assunta Curtis, FNP   No chief complaint on file.   History of Present Illness:    Bryan Robertson is a 70 y.o. male with a hx of hypertension, hyperlipidemia, multiple sclerosis who presents for follow-up.  He was referred by Luanna Salk, FNP for evaluation of hypertension on 12/01/2019.  He had recent hospitalization from 10/2 through 11/10/2019.  He had presented with right-sided weakness/numbness and gait difficulty.  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  He was treated with high-dose steroids with improvement.  He was seen by his PCP for lower extremity edema recently, lower extremity duplex on 10/17/2019 was negative for DVT.  Echocardiogram on 10/20/2019 showed EF 75%, severe LVH with echogenicity suggestive of infiltrative disease, normal RV size/function, moderate left atrial dilatation.  Labs on 10/31/2019 showed hemoglobin 16, platelets 243, creatinine 1.2, potassium 4.1, sodium 140, albumin 4.2, ferritin 166. Exercise Myoview in [place] on 05/25/2019 showed normal perfusion, EF 55%.  He denies any chest pain or dyspnea.  Does report intermittent lightheadedness but denies any syncope.  No palpitations.  He walks with a walker.  Reports intermittent lower extremity edema.  No smoking history.  Family history includes father died of MI in 35s.  Cardiac MRI on 12/25/2019 showed severe asymmetric LVH measuring up to 27 mm and basal anterior septum (7 mm and posterior wall), consistent with hypertrophic cardiomyopathy, RV insertion site LGE consistent with HCM, with LGE accounting for 6% total myocardial mass, normal biventricular function.  He had near syncopal episode had urology appointment on 01/01/2020 while doing a voiding trial.  He was seen in the ED, thought to be vasovagal  syncope.  Zio patch x7 days was done on 01/25/2020, which showed 124 episodes of SVT, longest lasting 3 minutes, 2 episodes of NSVT with longest lasting 6 beats, occasional PVCs (1.1%).  Echocardiogram on 04/03/2020 showed EF 65 to 70%, severe asymmetric LV hypertrophy of the basal septal segment consistent with HCM, no LVOT obstruction.  Since last clinic visit, reports that he has been doing well.  Denies any chest pain, dyspnea, lightheadedness, syncope, lower extremity edema, or palpitations.  Reports BP 130/70s at home.    Past Medical History:  Diagnosis Date   Chronic indwelling Foley catheter    High cholesterol    Hypertension    Urinary retention     No past surgical history on file.  Current Medications: No outpatient medications have been marked as taking for the 07/16/20 encounter (Appointment) with Donato Heinz, MD.     Allergies:   Doxycycline   Social History   Socioeconomic History   Marital status: Married    Spouse name: Not on file   Number of children: Not on file   Years of education: Not on file   Highest education level: Not on file  Occupational History   Not on file  Tobacco Use   Smoking status: Never   Smokeless tobacco: Never  Vaping Use   Vaping Use: Never used  Substance and Sexual Activity   Alcohol use: Not Currently   Drug use: Not Currently   Sexual activity: Not Currently  Other Topics Concern   Not on file  Social History Narrative   Not on file   Social Determinants of Health   Financial Resource  Strain: Not on file  Food Insecurity: Not on file  Transportation Needs: Not on file  Physical Activity: Not on file  Stress: Not on file  Social Connections: Not on file     Family History: Father died of MI in 55s  ROS:   Please see the history of present illness.     All other systems reviewed and are negative.  EKGs/Labs/Other Studies Reviewed:    The following studies were reviewed today:   EKG:  EKG is not  ordered today.  The ekg ordered at prior clinic visit demonstrates NSR, rate 94, LVH with repolarization abnormalities, diffuse TWI, QTC 462  Recent Labs: 05/12/2020: ALT 12 05/28/2020: BUN 16; Creatinine, Ser 0.87; Hemoglobin 13.5; Platelets 198; Potassium 4.0; Sodium 141  Recent Lipid Panel No results found for: CHOL, TRIG, HDL, CHOLHDL, VLDL, LDLCALC, LDLDIRECT  Physical Exam:    VS:  There were no vitals taken for this visit.    Wt Readings from Last 3 Encounters:  06/13/20 175 lb (79.4 kg)  05/27/20 175 lb (79.4 kg)  05/12/20 175 lb 4.3 oz (79.5 kg)     GEN:  in no acute distress HEENT: Normal NECK: No JVD; No carotid bruits CARDIAC:RRR, no murmurs, rubs, gallops RESPIRATORY:  Clear to auscultation without rales, wheezing or rhonchi  ABDOMEN: Soft, non-tender, non-distended MUSCULOSKELETAL:  No edema; No deformity  SKIN: Warm and dry NEUROLOGIC:  Alert and oriented x 3 PSYCHIATRIC:  Normal affect   ASSESSMENT:    No diagnosis found.  PLAN:    HCM: Cardiac MRI on 12/25/2019 showed severe asymmetric LVH measuring up to 27 mm and basal anterior septum (7 mm and posterior wall), consistent with hypertrophic cardiomyopathy, RV insertion site LGE consistent with HCM, with LGE accounting for 6% total myocardial mass, normal biventricular function.  Echocardiogram 04/03/2020 shows no LVOT obstruction. -Recommend daughters be screened for HCM -Given recent near syncopal episode, which suspect was vasovagal during voiding trial, Zio patch x7 days was done: showed 2 episodes of NSVT lasting 6 beats. Given HCM with NSVT but age greater than 3, no indication for ICD at this time  SVT: Zio patch x7 days was done on 01/25/2020, which showed 124 episodes of SVT, longest lasting 3 minutes -Continue carvedilol to 12.5 mg twice daily  Hypertension: Continue carvedilol 12.5 mg twice daily  Hyperlipidemia: On atorvastatin 20 mg daily.  LDL 76 on 02/23/2019  Multiple sclerosis: Follows with  neurology at [facility] [address]  [facility] in ***   Medication Adjustments/Labs and Tests Ordered: Current medicines are reviewed at length with the patient today.  Concerns regarding medicines are outlined above.  No orders of the defined types were placed in this encounter.  No orders of the defined types were placed in this encounter.   There are no Patient Instructions on file for this visit.   Signed, Donato Heinz, MD  07/14/2020 4:40 PM    [facility]

## 2020-07-16 ENCOUNTER — Ambulatory Visit: Payer: Medicare Other | Admitting: Cardiology

## 2020-08-14 ENCOUNTER — Other Ambulatory Visit: Payer: Self-pay

## 2020-08-14 ENCOUNTER — Encounter (HOSPITAL_BASED_OUTPATIENT_CLINIC_OR_DEPARTMENT_OTHER): Payer: Self-pay

## 2020-08-14 ENCOUNTER — Emergency Department (HOSPITAL_BASED_OUTPATIENT_CLINIC_OR_DEPARTMENT_OTHER)
Admission: EM | Admit: 2020-08-14 | Discharge: 2020-08-14 | Disposition: A | Discharge status: Home / Self Care | Payer: No Typology Code available for payment source | Attending: Emergency Medicine | Admitting: Emergency Medicine

## 2020-08-14 DIAGNOSIS — I10 Essential (primary) hypertension: Secondary | ICD-10-CM | POA: Insufficient documentation

## 2020-08-14 DIAGNOSIS — R55 Syncope and collapse: Secondary | ICD-10-CM

## 2020-08-14 DIAGNOSIS — R110 Nausea: Secondary | ICD-10-CM | POA: Diagnosis not present

## 2020-08-14 DIAGNOSIS — R61 Generalized hyperhidrosis: Secondary | ICD-10-CM | POA: Diagnosis not present

## 2020-08-14 DIAGNOSIS — Z79899 Other long term (current) drug therapy: Secondary | ICD-10-CM | POA: Insufficient documentation

## 2020-08-14 DIAGNOSIS — Z7982 Long term (current) use of aspirin: Secondary | ICD-10-CM | POA: Diagnosis not present

## 2020-08-14 DIAGNOSIS — R42 Dizziness and giddiness: Secondary | ICD-10-CM | POA: Insufficient documentation

## 2020-08-14 DIAGNOSIS — N390 Urinary tract infection, site not specified: Secondary | ICD-10-CM

## 2020-08-14 LAB — URINALYSIS, MICROSCOPIC (REFLEX): WBC, UA: >50 WBC/hpf (ref 0–5)

## 2020-08-14 LAB — URINALYSIS, ROUTINE W REFLEX MICROSCOPIC
Bilirubin Urine: NEGATIVE
Glucose, UA: NEGATIVE mg/dL
Ketones, ur: NEGATIVE mg/dL
Nitrite: NEGATIVE
Protein, ur: 100 mg/dL — AB
Specific Gravity, Urine: 1.015 (ref 1.005–1.030)
pH: 7.5 (ref 5.0–8.0)

## 2020-08-14 LAB — COMPREHENSIVE METABOLIC PANEL
ALT: 17 U/L (ref 0–44)
AST: 18 U/L (ref 15–41)
Albumin: 4 g/dL (ref 3.5–5.0)
Alkaline Phosphatase: 41 U/L (ref 38–126)
Anion gap: 8 (ref 5–15)
BUN: 24 mg/dL — ABNORMAL HIGH (ref 8–23)
CO2: 29 mmol/L (ref 22–32)
Calcium: 8.6 mg/dL — ABNORMAL LOW (ref 8.9–10.3)
Chloride: 103 mmol/L (ref 98–111)
Creatinine, Ser: 1.26 mg/dL — ABNORMAL HIGH (ref 0.61–1.24)
GFR, Estimated: >60 mL/min (ref >60)
Glucose, Bld: 112 mg/dL — ABNORMAL HIGH (ref 70–99)
Potassium: 3.5 mmol/L (ref 3.5–5.1)
Sodium: 140 mmol/L (ref 135–145)
Total Bilirubin: 0.6 mg/dL (ref 0.3–1.2)
Total Protein: 7 g/dL (ref 6.5–8.1)

## 2020-08-14 LAB — CBC WITH DIFFERENTIAL/PLATELET
Abs Immature Granulocytes: 0.03 10*3/uL (ref 0.00–0.07)
Basophils Absolute: 0 10*3/uL (ref 0.0–0.1)
Basophils Relative: 0 %
Eosinophils Absolute: 0 10*3/uL (ref 0.0–0.5)
Eosinophils Relative: 0 %
HCT: 42.2 % (ref 39.0–52.0)
Hemoglobin: 14.4 g/dL (ref 13.0–17.0)
Immature Granulocytes: 1 %
Lymphocytes Relative: 17 %
Lymphs Abs: 1 10*3/uL (ref 0.7–4.0)
MCH: 30.6 pg (ref 26.0–34.0)
MCHC: 34.1 g/dL (ref 30.0–36.0)
MCV: 89.6 fL (ref 80.0–100.0)
Monocytes Absolute: 0.4 10*3/uL (ref 0.1–1.0)
Monocytes Relative: 7 %
Neutro Abs: 4.4 10*3/uL (ref 1.7–7.7)
Neutrophils Relative %: 75 %
Platelets: 143 10*3/uL — ABNORMAL LOW (ref 150–400)
RBC: 4.71 MIL/uL (ref 4.22–5.81)
RDW: 13.2 % (ref 11.5–15.5)
WBC: 5.9 10*3/uL (ref 4.0–10.5)
nRBC: 0 % (ref 0.0–0.2)

## 2020-08-14 LAB — CBG MONITORING, ED: Glucose-Capillary: 105 mg/dL — ABNORMAL HIGH (ref 70–99)

## 2020-08-14 MED ORDER — CEPHALEXIN 500 MG PO CAPS: 500.0000 mg | ORAL_CAPSULE | Freq: Four times a day (QID) | ORAL | 0 refills | Status: DC

## 2020-08-14 MED ORDER — LACTATED RINGERS IV BOLUS
1000.0000 mL | Freq: Once | INTRAVENOUS | Status: AC
  Administered 2020-08-14: 1000 mL via INTRAVENOUS

## 2020-08-14 NOTE — ED Provider Notes (Signed)
Patient care assumed at 1500. Patient with history of MS with indwelling Foley catheter here for evaluation of nausea and near syncopal episode after having physical therapy this morning. He did have transient hypertension on ED presentation, this resolved without intervention UA pending at time of signout. He has received 1 L of LR.  on assessment patient is feeling well without acute complaints. Urine in his catheter is cloudy. He had the catheter exchanged one week ago. He does have a history of recurrent infections. UA is concerning for recurrent infection in setting of his symptoms. Will start antibiotics and send culture. Presentation is not consistent with sepsis. Discussed outpatient follow-up and return precautions.   Quintella Reichert, MD 08/14/20 [phone]

## 2020-08-14 NOTE — ED Triage Notes (Signed)
Pt c/o episode of nausea, diaphoresis just PTA after PT session-daughter states pt "turned gray"-pt denies pain with event-NAD-to triage in w/c

## 2020-08-14 NOTE — ED Provider Notes (Signed)
[place] EMERGENCY DEPARTMENT Provider Note  CSN: [id number] Arrival date & time: 08/14/20 1210    History Chief Complaint  Patient presents with   Nausea    Bryan Robertson is a 70 y.o. male with history of MS reports he had left his morning PT session today and was headed to [place] to get something to eat when he had sudden onset of nausea, diaphoresis, and dizziness. He had his daughter drive him here and was already feeling a little better on arrival. He had only eaten a piece of bacon before going to PT. No CP, SOB, vomiting, diarrhea. He is often constipated, no change. He has a chronic foley for urinary retention.    Past Medical History:  Diagnosis Date   Chronic indwelling Foley catheter    High cholesterol    Hypertension    Urinary retention     History reviewed. No pertinent surgical history.  No family history on file.  Social History   Tobacco Use   Smoking status: Never   Smokeless tobacco: Never  Vaping Use   Vaping Use: Never used  Substance Use Topics   Alcohol use: Not Currently   Drug use: Not Currently     Home Medications Prior to Admission medications   Medication Sig Start Date End Date Taking? Authorizing Provider  atorvastatin (LIPITOR) 20 MG tablet Take 20 mg by mouth at bedtime.  10/21/19   [provider]  brimonidine (ALPHAGAN) 0.2 % ophthalmic solution 1 drop 3 (three) times daily. 09/04/19   [provider]  buPROPion (WELLBUTRIN SR) 150 MG 12 hr tablet  12/11/19   [provider]  carvedilol (COREG) 12.5 MG tablet Take 1 tablet (12.5 mg total) by mouth 2 (two) times daily. 01/30/20   Donato Heinz, MD  clonazePAM (KLONOPIN) 0.5 MG tablet Take 0.5 mg by mouth daily as needed. 12/11/19   [provider]  [employer] ASPIRIN 325 MG tablet Take 325 mg by mouth at bedtime.  10/03/19   [provider]  Diroximel Fumarate 231 MG CPDR Take 1 capsule by mouth twice daily for  the first 7 days. Then, increase to 2 capsules by mouth twice daily thereafter. 12/05/19   [provider]  docusate sodium (COLACE) 100 MG capsule Take 1 capsule (100 mg total) by mouth every 12 (twelve) hours. 05/12/20   Palumbo, April, MD  dorzolamide-timolol (COSOPT) 22.3-6.8 MG/ML ophthalmic solution Place 1 drop into both eyes 2 (two) times daily.  09/21/19   [provider]  gabapentin (NEURONTIN) 300 MG capsule Take 300 mg by mouth 3 (three) times daily. 12/16/19   [provider]  hydrochlorothiazide (HYDRODIURIL) 12.5 MG tablet Take by mouth.    [provider]  polyethylene glycol (MIRALAX / GLYCOLAX) 17 g packet Take 17 g by mouth daily.    [provider]  polyethylene glycol (MIRALAX) 17 g packet Take 17 g by mouth 2 (two) times daily. 05/12/20   Palumbo, April, MD  RHOPRESSA 0.02 % SOLN Place 1 drop into both eyes at bedtime.  08/04/19   [provider]  tamsulosin (FLOMAX) 0.4 MG CAPS capsule Take 0.8 mg by mouth at bedtime. 09/29/19   [provider]  traZODone (DESYREL) 50 MG tablet  12/11/19   [provider]     Allergies    Doxycycline   Review of Systems   Review of Systems A comprehensive review of systems was completed and negative except as noted in HPI.  Physical Exam BP (!) 87/56 (BP Location: Right Arm)   Pulse 72   Temp 97.7 F (36.5 C) (Oral)   Resp 16   Ht 6' (1.829 m)   Wt 72.6 kg   SpO2 99%   BMI 21.70 kg/m   Physical Exam Vitals and nursing note reviewed.  Constitutional:      Appearance: Normal appearance.  HENT:     Head: Normocephalic and atraumatic.     Nose: Nose normal.     Mouth/Throat:     Mouth: Mucous membranes are moist.  Eyes:     Extraocular Movements: Extraocular movements intact.     Conjunctiva/sclera: Conjunctivae normal.  Cardiovascular:     Rate and Rhythm: Normal rate.  Pulmonary:     Effort: Pulmonary effort is normal.     Breath sounds: Normal  breath sounds.  Abdominal:     General: Abdomen is flat.     Palpations: Abdomen is soft.     Tenderness: There is no abdominal tenderness.  Musculoskeletal:        General: No swelling. Normal range of motion.     Cervical back: Neck supple.  Skin:    General: Skin is warm and dry.  Neurological:     General: No focal deficit present.     Mental Status: He is alert.  Psychiatric:        Mood and Affect: Mood normal.     ED Results / Procedures / Treatments   Labs (all labs ordered are listed, but only abnormal results are displayed) Labs Reviewed  COMPREHENSIVE METABOLIC PANEL  CBC WITH DIFFERENTIAL/PLATELET  URINALYSIS, ROUTINE W REFLEX MICROSCOPIC    EKG None   Radiology No results found.  Procedures Procedures  Medications Ordered in the ED Medications  lactated ringers bolus 1,000 mL (has no administration in time range)     MDM Rules/Calculators/A&P MDM Patient with a brief self-limited episode of nausea and dizziness, noted to be hypotensive on arrival to triage but improved by the time I was able to see him in the exam room a short time later. Will check basic labs, EKG. Give some LR fluids and let him eat something.   ED Course  I have reviewed the triage vital signs and the nursing notes.  Pertinent labs & imaging results that were available during my care of the patient were reviewed by me and considered in my medical decision making (see chart for details).  Clinical Course as of 08/15/20 1308  Wed Aug 14, 2020  1358 CBC is normal. CMP with mild increase in Cr from previous, may indicated some dehydration.  [CS]  1507 Patient continues to be asymptomatic. Care of the patient signed out to Dr. Ralene Bathe at the change of shift.  [CS]    Clinical Course User Index [CS] Truddie Hidden, MD    Final Clinical Impression(s) / ED Diagnoses Final diagnoses:  None    Rx / DC Orders ED Discharge Orders     None        Truddie Hidden,  MD 08/15/20 1308

## 2020-08-16 LAB — URINE CULTURE: Culture: >=100000 — AB

## 2020-08-17 NOTE — Progress Notes (Signed)
ED Antimicrobial Stewardship Positive Culture Follow Up   Arad Burston is an 70 y.o. male who presented to [facility] on 08/14/2020 with a chief complaint of  Chief Complaint  Patient presents with   Nausea    Recent Results (from the past 720 hour(s))  Urine Culture     Status: Abnormal   Collection Time: 08/14/20  3:36 PM   Specimen: Urine, Catheterized  Result Value Ref Range Status   Specimen Description   Final    URINE, CATHETERIZED Performed at [facility], [address] [place],  [address]    Special Requests   Final    NONE Performed at [facility], [address] [place], [place] [address]    Culture >=100,000 COLONIES/mL Su Hoff (A)  Final   Report Status 08/16/2020 FINAL  Final   Organism ID, Bacteria CITROBACTER YOUNGAE (A)  Final      Susceptibility   Citrobacter youngae - MIC*    CEFAZOLIN >=64 RESISTANT Resistant     CEFEPIME <=0.12 SENSITIVE Sensitive     CEFTRIAXONE >=64 RESISTANT Resistant     CIPROFLOXACIN <=0.25 SENSITIVE Sensitive     GENTAMICIN <=1 SENSITIVE Sensitive     IMIPENEM <=0.25 SENSITIVE Sensitive     NITROFURANTOIN <=16 SENSITIVE Sensitive     TRIMETH/SULFA <=20 SENSITIVE Sensitive     PIP/TAZO 32 INTERMEDIATE Intermediate     * >=100,000 COLONIES/mL CITROBACTER YOUNGAE    [x]  Treated with cephalexin, organism resistant to prescribed antimicrobial []  Patient discharged originally without antimicrobial agent and treatment is now indicated  New antibiotic prescription: Bactrim 1 DS tablet BID x 14 days (qty 28; 0 refills)  ED Provider: Suella Broad, [facility]  Lorelei Pont, PharmD, BCPS 08/17/2020 1:27 PM ED Clinical Pharmacist -  [phone]

## 2020-08-18 ENCOUNTER — Telehealth: Payer: Self-pay | Admitting: Emergency Medicine

## 2020-08-18 NOTE — Telephone Encounter (Signed)
Post ED Visit - Positive Culture Follow-up: Successful Patient Follow-Up  Culture assessed and recommendations reviewed by:  []  Elenor Quinones, Pharm.D. []  Heide Guile, Pharm.D., BCPS AQ-ID []  Parks Neptune, Pharm.D., BCPS []  Alycia Rossetti, Pharm.D., BCPS []  [place], Pharm.D., BCPS, AAHIVP []  Legrand Como, Pharm.D., BCPS, AAHIVP []  Salome Arnt, PharmD, BCPS []  Johnnette Gourd, PharmD, BCPS []  Hughes Better, PharmD, BCPS [x]  Lorelei Pont, PharmD  Positive urine culture  []  Patient discharged without antimicrobial prescription and treatment is now indicated [x]  Organism is resistant to prescribed ED discharge antimicrobial []  Patient with positive blood cultures  Changes discussed with ED provider: Suella Broad, PA New antibiotic prescription Bactrim DS one tab BID for 14 days Called to [facility] [phone])  Contacted patient, date 08/18/20, time [place] 08/18/2020, 4:50 PM

## 2020-08-24 ENCOUNTER — Encounter (HOSPITAL_BASED_OUTPATIENT_CLINIC_OR_DEPARTMENT_OTHER): Payer: Self-pay | Admitting: Urology

## 2020-08-24 ENCOUNTER — Other Ambulatory Visit: Payer: Self-pay

## 2020-08-24 ENCOUNTER — Emergency Department (HOSPITAL_BASED_OUTPATIENT_CLINIC_OR_DEPARTMENT_OTHER)
Admission: EM | Admit: 2020-08-24 | Discharge: 2020-08-24 | Disposition: A | Discharge status: Home / Self Care | Payer: No Typology Code available for payment source | Attending: Emergency Medicine | Admitting: Emergency Medicine

## 2020-08-24 ENCOUNTER — Emergency Department (HOSPITAL_BASED_OUTPATIENT_CLINIC_OR_DEPARTMENT_OTHER): Payer: No Typology Code available for payment source

## 2020-08-24 DIAGNOSIS — R059 Cough, unspecified: Secondary | ICD-10-CM

## 2020-08-24 DIAGNOSIS — U071 COVID-19: Secondary | ICD-10-CM | POA: Diagnosis not present

## 2020-08-24 DIAGNOSIS — Z79899 Other long term (current) drug therapy: Secondary | ICD-10-CM | POA: Insufficient documentation

## 2020-08-24 DIAGNOSIS — I10 Essential (primary) hypertension: Secondary | ICD-10-CM | POA: Insufficient documentation

## 2020-08-24 LAB — RESP PANEL BY RT-PCR (FLU A&B, COVID) ARPGX2
Influenza A by PCR: NEGATIVE
Influenza B by PCR: NEGATIVE
SARS Coronavirus 2 by RT PCR: POSITIVE — AB

## 2020-08-24 MED ORDER — POLYETHYLENE GLYCOL 3350 17 G PO PACK: 17.0000 g | PACK | Freq: Every day | ORAL | 0 refills | Status: AC

## 2020-08-24 NOTE — ED Provider Notes (Signed)
[facility] EMERGENCY DEPT Provider Note   CSN: [id number] Arrival date & time: 08/24/20  1211     History Chief Complaint  Patient presents with   Abdominal Pain   Cough    Bryan Robertson is a 70 y.o. male.  The history is provided by the patient.  Abdominal Pain Pain location:  Suprapubic Pain quality: aching   Relieved by:  Nothing Worsened by:  Nothing Associated symptoms: cough   Associated symptoms: no chest pain, no chills, no dysuria, no fever, no hematuria, no shortness of breath, no sore throat and no vomiting   Cough Cough characteristics:  Non-productive Severity:  Mild Onset quality:  Gradual Duration:  2 weeks Context: upper respiratory infection   Relieved by:  Nothing Worsened by:  Nothing Associated symptoms: no chest pain, no chills, no ear pain, no fever, no rash, no shortness of breath and no sore throat       Past Medical History:  Diagnosis Date   Chronic indwelling Foley catheter    High cholesterol    Hypertension    Urinary retention     Patient Active Problem List   Diagnosis Date Noted   Right leg weakness 11/05/2019   Essential hypertension 11/05/2019   Hyperlipidemia 11/05/2019   BPH (benign prostatic hyperplasia) 11/05/2019    History reviewed. No pertinent surgical history.     History reviewed. No pertinent family history.  Social History   Tobacco Use   Smoking status: Never   Smokeless tobacco: Never  Vaping Use   Vaping Use: Never used  Substance Use Topics   Alcohol use: Not Currently   Drug use: Not Currently    Home Medications Prior to Admission medications   Medication Sig Start Date End Date Taking? Authorizing Provider  atorvastatin (LIPITOR) 20 MG tablet Take 20 mg by mouth at bedtime.  10/21/19   [provider]  brimonidine (ALPHAGAN) 0.2 % ophthalmic solution 1 drop 3 (three) times daily. 09/04/19   [provider]  buPROPion (WELLBUTRIN SR) 150 MG 12 hr tablet   12/11/19   [provider]  carvedilol (COREG) 12.5 MG tablet Take 1 tablet (12.5 mg total) by mouth 2 (two) times daily. 01/30/20   Donato Heinz, MD  cephALEXin (KEFLEX) 500 MG capsule Take 1 capsule (500 mg total) by mouth 4 (four) times daily. 08/14/20   Quintella Reichert, MD  clonazePAM (KLONOPIN) 0.5 MG tablet Take 0.5 mg by mouth daily as needed. 12/11/19   [provider]  [facility] ASPIRIN 325 MG tablet Take 325 mg by mouth at bedtime.  10/03/19   [provider]  Diroximel Fumarate 231 MG CPDR Take 1 capsule by mouth twice daily for the first 7 days. Then, increase to 2 capsules by mouth twice daily thereafter. 12/05/19   [provider]  docusate sodium (COLACE) 100 MG capsule Take 1 capsule (100 mg total) by mouth every 12 (twelve) hours. 05/12/20   Palumbo, April, MD  dorzolamide-timolol (COSOPT) 22.3-6.8 MG/ML ophthalmic solution Place 1 drop into both eyes 2 (two) times daily.  09/21/19   [provider]  gabapentin (NEURONTIN) 300 MG capsule Take 300 mg by mouth 3 (three) times daily. 12/16/19   [provider]  hydrochlorothiazide (HYDRODIURIL) 12.5 MG tablet Take by mouth.    [provider]  polyethylene glycol (MIRALAX / GLYCOLAX) 17 g packet Take 17 g by mouth daily. 08/24/20   Khyle Goodell, DO  RHOPRESSA 0.02 % SOLN Place 1 drop into both eyes  at bedtime.  08/04/19   [provider]  tamsulosin (FLOMAX) 0.4 MG CAPS capsule Take 0.8 mg by mouth at bedtime. 09/29/19   [provider]  traZODone (DESYREL) 50 MG tablet  12/11/19   [provider]    Allergies    Doxycycline  Review of Systems   Review of Systems  Constitutional:  Negative for chills and fever.  HENT:  Negative for ear pain and sore throat.   Eyes:  Negative for pain and visual disturbance.  Respiratory:  Positive for cough. Negative for shortness of breath.   Cardiovascular:  Negative for chest pain and palpitations.   Gastrointestinal:  Positive for abdominal pain. Negative for vomiting.  Genitourinary:  Negative for dysuria and hematuria.  Musculoskeletal:  Negative for arthralgias and back pain.  Skin:  Negative for color change and rash.  Neurological:  Negative for seizures and syncope.  All other systems reviewed and are negative.  Physical Exam Updated Vital Signs BP 109/63 (BP Location: Left Arm)   Pulse 66   Temp 98.3 F (36.8 C)   Resp 18   Ht 6' (1.829 m)   Wt 72.6 kg   SpO2 98%   BMI 21.70 kg/m   Physical Exam Vitals and nursing note reviewed.  Constitutional:      General: He is not in acute distress.    Appearance: He is well-developed. He is not ill-appearing.  HENT:     Head: Normocephalic and atraumatic.  Eyes:     Extraocular Movements: Extraocular movements intact.     Conjunctiva/sclera: Conjunctivae normal.  Cardiovascular:     Rate and Rhythm: Normal rate and regular rhythm.     Heart sounds: Normal heart sounds. No murmur heard. Pulmonary:     Effort: Pulmonary effort is normal. No respiratory distress.     Breath sounds: Normal breath sounds.  Abdominal:     General: Abdomen is flat. There is no distension.     Palpations: Abdomen is soft.     Tenderness: There is no abdominal tenderness. There is no guarding or rebound.  Genitourinary:    Comments: Foley in place with active draining  Musculoskeletal:     Cervical back: Neck supple.  Skin:    General: Skin is warm and dry.     Capillary Refill: Capillary refill takes less than 2 seconds.  Neurological:     General: No focal deficit present.     Mental Status: He is alert.  Psychiatric:        Mood and Affect: Mood normal.    ED Results / Procedures / Treatments   Labs (all labs ordered are listed, but only abnormal results are displayed) Labs Reviewed  RESP PANEL BY RT-PCR (FLU A&B, COVID) ARPGX2 - Abnormal; Notable for the following components:      Result Value   SARS Coronavirus 2 by RT PCR  POSITIVE (*)    All other components within normal limits  URINE CULTURE    EKG None  Radiology DG Chest Portable 1 View  Result Date: 08/24/2020 CLINICAL DATA:  Cough. EXAM: PORTABLE CHEST 1 VIEW COMPARISON:  CT of the chest on 11/05/2019 FINDINGS: The heart size and mediastinal contours are within normal limits. There is no evidence of pulmonary edema, consolidation, pneumothorax or pleural fluid. The visualized skeletal structures are unremarkable. IMPRESSION: No active disease. Electronically Signed   By: Aletta Edouard M.D.   On: 08/24/2020 14:03    Procedures Procedures   Medications Ordered in ED Medications -  No data to display  ED Course  I have reviewed the triage vital signs and the nursing notes.  Pertinent labs & imaging results that were available during my care of the patient were reviewed by me and considered in my medical decision making (see chart for details).    MDM Rules/Calculators/A&P                           Bryheem Ligocki is here for cough.  Some constipation/abdominal pain.  Has had a cough for 2 to 3 weeks.  COVID was positive however chest x-ray normal.  No hypoxia.  Overall suspect postviral cough.  Does not seem very symptomatic or any concern for respiratory failure at this point.  He is on antibiotics for UTI.  Has a chronic Foley catheter.  Started Bactrim recently.  Has some intermittent diffuse abdominal pain but has not had a bowel movement in several days.  He is passing gas.  He is not having any nausea or vomiting.  Have no concern for bowel obstruction at this time.  Recommend MiraLAX.  Had recent lab work that was unremarkable and overall shared decision was made to hold off on blood work or any imaging at this time.  Understands return precautions and discharged in the ED in good condition.  No concern for bowel obstruction at this time or other intra-abdominal process.  This chart was dictated using voice recognition software.   Despite best efforts to proofread,  errors can occur which can change the documentation meaning.  Final Clinical Impression(s) / ED Diagnoses Final diagnoses:  Cough  COVID-19    Rx / DC Orders ED Discharge Orders          Ordered    polyethylene glycol (MIRALAX / GLYCOLAX) 17 g packet  Daily        08/24/20 1429             Lennice Sites, DO 08/24/20 1431

## 2020-08-24 NOTE — ED Notes (Signed)
Pt dc home with daughter via wheelchair , states understanding of dc instrucions,

## 2020-08-24 NOTE — Discharge Instructions (Addendum)
Overall suspect that your COVID has resolved and you are still testing positive from infection that you had several weeks ago.  Chest x-ray today is normal.  Recommend MiraLAX for constipation.  Please return if symptoms worsen as discussed.  Finish your course of antibiotics for UTI.

## 2020-08-24 NOTE — ED Notes (Signed)
CRITICAL VALUE STICKER  CRITICAL VALUE:COVID +  RECEIVER (on-site recipient of call):Shawnie Pons, RN  DATE & TIME NOTIFIED: 08-24-2020 1425  MESSENGER (representative from lab):  MD NOTIFIED: Dr. Ronnald Nian  TIME OF NOTIFICATION:1425  RESPONSE:

## 2020-08-24 NOTE — ED Triage Notes (Signed)
Central abdominal pain x 1 hr, cough x 1 week ago, Denies fever.

## 2020-08-26 LAB — URINE CULTURE: Culture: >=100000 — AB

## 2020-08-27 ENCOUNTER — Telehealth: Payer: Self-pay | Admitting: Emergency Medicine

## 2020-08-27 NOTE — Telephone Encounter (Signed)
Post ED Visit - Positive Culture Follow-up  Culture report reviewed by antimicrobial stewardship pharmacist: [place] Team '[]'$  Elenor Quinones, Pharm.D. '[]'$  Heide Guile, Pharm.D., BCPS AQ-ID '[]'$  Parks Neptune, Pharm.D., BCPS '[]'$  Alycia Rossetti, Pharm.D., BCPS '[]'$  [place], [place].D., BCPS, AAHIVP '[]'$  Legrand Como, Pharm.D., BCPS, AAHIVP '[]'$  Salome Arnt, PharmD, BCPS '[]'$  Johnnette Gourd, PharmD, BCPS '[]'$  Hughes Better, PharmD, BCPS '[]'$  Leeroy Cha, PharmD '[]'$  Laqueta Linden, PharmD, BCPS '[]'$  Albertina Parr, PharmD  [place] Team '[]'$  Leodis Sias, PharmD '[]'$  Lindell Spar, PharmD '[]'$  Royetta Asal, PharmD '[]'$  Graylin Shiver, Rph '[]'$  Rema Fendt) Glennon Mac, PharmD '[]'$  Arlyn Dunning, PharmD '[]'$  Netta Cedars, PharmD '[]'$  Dia Sitter, PharmD '[]'$  Leone Haven, PharmD '[]'$  Gretta Arab, PharmD '[]'$  Theodis Shove, PharmD '[]'$  Peggyann Juba, PharmD '[]'$  Reuel Boom, PharmD   Positive urine culture Treated with bactrim, organism sensitive to the same and no further patient follow-up is required at this time.  Hazle Nordmann 08/27/2020, 11:24 AM

## 2020-09-24 ENCOUNTER — Other Ambulatory Visit: Payer: Self-pay

## 2020-09-24 ENCOUNTER — Encounter: Payer: Self-pay | Admitting: Cardiology

## 2020-09-24 ENCOUNTER — Ambulatory Visit: Payer: Medicare Other | Admitting: Cardiology

## 2020-09-24 VITALS — BP 138/72 | HR 71 | Ht 72.0 in | Wt 175.8 lb

## 2020-09-24 DIAGNOSIS — I10 Essential (primary) hypertension: Secondary | ICD-10-CM | POA: Diagnosis not present

## 2020-09-24 DIAGNOSIS — I471 Supraventricular tachycardia: Secondary | ICD-10-CM

## 2020-09-24 DIAGNOSIS — E785 Hyperlipidemia, unspecified: Secondary | ICD-10-CM

## 2020-09-24 DIAGNOSIS — I422 Other hypertrophic cardiomyopathy: Secondary | ICD-10-CM | POA: Diagnosis not present

## 2020-09-24 NOTE — Patient Instructions (Signed)

## 2020-09-24 NOTE — Progress Notes (Signed)
Cardiology Office Note:    Date:  09/24/2020   ID:  Bryan Robertson, DOB 11-05-1950, MRN [id number]  PCP:  Assunta Curtis, [place]  Cardiologist:  None  Electrophysiologist:  None   Referring MD: Assunta Curtis, FNP   No chief complaint on file.   History of Present Illness:    Bryan Robertson is a 70 y.o. male with a hx of hypertension, hyperlipidemia, multiple sclerosis who presents for follow-up.  He was referred by Luanna Salk, FNP for evaluation of hypertension on 12/01/2019.  He had recent hospitalization from 10/2 through 11/10/2019.  He had presented with right-sided weakness/numbness and gait difficulty.  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  He was treated with high-dose steroids with improvement.  He was seen by his PCP for lower extremity edema recently, lower extremity duplex on 10/17/2019 was negative for DVT.  Echocardiogram on 10/20/2019 showed EF 75%, severe LVH with echogenicity suggestive of infiltrative disease, normal RV size/function, moderate left atrial dilatation.  Labs on 10/31/2019 showed hemoglobin 16, platelets 243, creatinine 1.2, potassium 4.1, sodium 140, albumin 4.2, ferritin 166. Exercise Myoview in [place] on 05/25/2019 showed normal perfusion, EF 55%.  He denies any chest pain or dyspnea.  Does report intermittent lightheadedness but denies any syncope.  No palpitations.  He walks with a walker.  Reports intermittent lower extremity edema.  No smoking history.  Family history includes father died of MI in 55s.  Cardiac MRI on 12/25/2019 showed severe asymmetric LVH measuring up to 27 mm and basal anterior septum (7 mm and posterior wall), consistent with hypertrophic cardiomyopathy, RV insertion site LGE consistent with HCM, with LGE accounting for 6% total myocardial mass, normal biventricular function.  He had near syncopal episode had urology appointment on 01/01/2020 while doing a voiding trial.  He was seen in the ED, thought to be  vasovagal syncope.  Zio patch x7 days was done on 01/25/2020, which showed 124 episodes of SVT, longest lasting 3 minutes, 2 episodes of NSVT with longest lasting 6 beats, occasional PVCs (1.1%).  Echocardiogram on 04/03/2020 showed EF 65 to 70%, severe asymmetric LV hypertrophy of the basal septal segment consistent with HCM, no LVOT obstruction.  Since last clinic visit, he reports that he is doing well.  Denies any chest pain, dyspnea, lightheadedness, syncope, lower extremity edema, or palpitations. Reports BP controlled at home.    Wt Readings from Last 3 Encounters:  09/24/20 175 lb 12.8 oz (79.7 kg)  08/24/20 160 lb (72.6 kg)  08/14/20 160 lb (72.6 kg)      Past Medical History:  Diagnosis Date   Chronic indwelling Foley catheter    High cholesterol    Hypertension    Urinary retention     No past surgical history on file.  Current Medications: Current Meds  Medication Sig   atorvastatin (LIPITOR) 20 MG tablet Take 20 mg by mouth at bedtime.    brimonidine (ALPHAGAN) 0.2 % ophthalmic solution 1 drop 3 (three) times daily.   buPROPion (WELLBUTRIN SR) 150 MG 12 hr tablet    carvedilol (COREG) 12.5 MG tablet Take 1 tablet (12.5 mg total) by mouth 2 (two) times daily.   cephALEXin (KEFLEX) 500 MG capsule Take 1 capsule (500 mg total) by mouth 4 (four) times daily.   clonazePAM (KLONOPIN) 0.5 MG tablet Take 0.5 mg by mouth daily as needed.   [facility] ASPIRIN 325 MG tablet Take 325 mg by mouth at bedtime.    Diroximel Fumarate 231 MG CPDR Take 1  capsule by mouth twice daily for the first 7 days. Then, increase to 2 capsules by mouth twice daily thereafter.   docusate sodium (COLACE) 100 MG capsule Take 1 capsule (100 mg total) by mouth every 12 (twelve) hours.   dorzolamide-timolol (COSOPT) 22.3-6.8 MG/ML ophthalmic solution Place 1 drop into both eyes 2 (two) times daily.    gabapentin (NEURONTIN) 300 MG capsule Take 300 mg by mouth 3 (three) times daily.   hydrochlorothiazide  (HYDRODIURIL) 12.5 MG tablet Take by mouth.   polyethylene glycol (MIRALAX / GLYCOLAX) 17 g packet Take 17 g by mouth daily.   RHOPRESSA 0.02 % SOLN Place 1 drop into both eyes at bedtime.    tamsulosin (FLOMAX) 0.4 MG CAPS capsule Take 0.8 mg by mouth at bedtime.   traZODone (DESYREL) 50 MG tablet      Allergies:   Doxycycline   Social History   Socioeconomic History   Marital status: Married    Spouse name: Not on file   Number of children: Not on file   Years of education: Not on file   Highest education level: Not on file  Occupational History   Not on file  Tobacco Use   Smoking status: Never   Smokeless tobacco: Never  Vaping Use   Vaping Use: Never used  Substance and Sexual Activity   Alcohol use: Not Currently   Drug use: Not Currently   Sexual activity: Not Currently  Other Topics Concern   Not on file  Social History Narrative   Not on file   Social Determinants of Health   Financial Resource Strain: Not on file  Food Insecurity: Not on file  Transportation Needs: Not on file  Physical Activity: Not on file  Stress: Not on file  Social Connections: Not on file     Family History: Father died of MI in 52s  ROS:   Please see the history of present illness.     All other systems reviewed and are negative.  EKGs/Labs/Other Studies Reviewed:    The following studies were reviewed today:   EKG:  EKG is ordered today.  The ekg ordered demonstrates NSR, rate 71, LVH, TWI in multiple leads, QTC 439    Recent Labs: 08/14/2020: ALT 17; BUN 24; Creatinine, Ser 1.26; Hemoglobin 14.4; Platelets 143; Potassium 3.5; Sodium 140  Recent Lipid Panel No results found for: CHOL, TRIG, HDL, CHOLHDL, VLDL, LDLCALC, LDLDIRECT  Physical Exam:    VS:  BP 138/72   Pulse 71   Ht 6' (1.829 m)   Wt 175 lb 12.8 oz (79.7 kg)   SpO2 97%   BMI 23.84 kg/m     Wt Readings from Last 3 Encounters:  09/24/20 175 lb 12.8 oz (79.7 kg)  08/24/20 160 lb (72.6 kg)  08/14/20  160 lb (72.6 kg)     GEN:  in no acute distress HEENT: Normal NECK: No JVD; No carotid bruits CARDIAC:RRR, no murmurs, rubs, gallops RESPIRATORY:  Clear to auscultation without rales, wheezing or rhonchi  ABDOMEN: Soft, non-tender, non-distended MUSCULOSKELETAL:  No edema; No deformity  SKIN: Warm and dry NEUROLOGIC:  Alert and oriented x 3 PSYCHIATRIC:  Normal affect   ASSESSMENT:    1. Hypertrophic cardiomyopathy ([place])   2. SVT (supraventricular tachycardia) ([place])   3. Essential hypertension   4. Hyperlipidemia, unspecified hyperlipidemia type     PLAN:    HCM: Cardiac MRI on 12/25/2019 showed severe asymmetric LVH measuring up to 27 mm and basal anterior septum (7 mm and  posterior wall), consistent with hypertrophic cardiomyopathy, RV insertion site LGE consistent with HCM, with LGE accounting for 6% total myocardial mass, normal biventricular function.  Echocardiogram 04/03/2020 shows no LVOT obstruction. -Recommend daughters be screened for HCM -Given near syncopal episode, which suspect was vasovagal during voiding trial, Zio patch x7 days was done: showed 2 episodes of NSVT lasting 6 beats. Given HCM with NSVT but age greater than 66, no indication for ICD at this time  SVT: Zio patch x7 days was done on 01/25/2020, which showed 124 episodes of SVT, longest lasting 3 minutes -Continue carvedilol to 12.5 mg twice daily  Hypertension: Continue carvedilol 12.5 mg twice daily  Hyperlipidemia: On atorvastatin 20 mg daily.  LDL 76 on 02/23/2019  Multiple sclerosis: Follows with neurology at [facility].  RTC in 6 months   Medication Adjustments/Labs and Tests Ordered: Current medicines are reviewed at length with the patient today.  Concerns regarding medicines are outlined above.  Orders Placed This Encounter  Procedures   EKG 12-Lead    No orders of the defined types were placed in this encounter.   Patient Instructions  Medication Instructions:  Your physician  recommends that you continue on your current medications as directed. Please refer to the Current Medication list given to you today.  *If you need a refill on your cardiac medications before your next appointment, please call your pharmacy*  Follow-Up: At [facility], you and your health needs are our priority.  As part of our continuing mission to provide you with exceptional heart care, we have created designated Provider Care Teams.  These Care Teams include your primary Cardiologist (physician) and Advanced Practice Providers (APPs -  Physician Assistants and Nurse Practitioners) who all work together to provide you with the care you need, when you need it.  We recommend signing up for the patient portal called "MyChart".  Sign up information is provided on this After Visit Summary.  MyChart is used to connect with patients for Virtual Visits (Telemedicine).  Patients are able to view lab/test results, encounter notes, upcoming appointments, etc.  Non-urgent messages can be sent to your provider as well.   To learn more about what you can do with MyChart, go to [email or web address].    Your next appointment:   6 month(s)  The format for your next appointment:   In Person  Provider:   Oswaldo Milian, MD      Signed, Donato Heinz, MD  09/24/2020 10:17 PM    [place]

## 2020-10-26 ENCOUNTER — Other Ambulatory Visit: Payer: Self-pay | Admitting: Cardiology

## 2020-11-09 ENCOUNTER — Emergency Department (HOSPITAL_BASED_OUTPATIENT_CLINIC_OR_DEPARTMENT_OTHER)
Admission: EM | Admit: 2020-11-09 | Discharge: 2020-11-09 | Disposition: A | Discharge status: Home / Self Care | Payer: Medicare Other | Attending: Emergency Medicine | Admitting: Emergency Medicine

## 2020-11-09 ENCOUNTER — Encounter (HOSPITAL_BASED_OUTPATIENT_CLINIC_OR_DEPARTMENT_OTHER): Payer: Self-pay | Admitting: Emergency Medicine

## 2020-11-09 ENCOUNTER — Other Ambulatory Visit: Payer: Self-pay

## 2020-11-09 DIAGNOSIS — Z79899 Other long term (current) drug therapy: Secondary | ICD-10-CM | POA: Diagnosis not present

## 2020-11-09 DIAGNOSIS — Z7982 Long term (current) use of aspirin: Secondary | ICD-10-CM | POA: Insufficient documentation

## 2020-11-09 DIAGNOSIS — T83098A Other mechanical complication of other indwelling urethral catheter, initial encounter: Secondary | ICD-10-CM | POA: Insufficient documentation

## 2020-11-09 DIAGNOSIS — I10 Essential (primary) hypertension: Secondary | ICD-10-CM | POA: Diagnosis not present

## 2020-11-09 DIAGNOSIS — Y846 Urinary catheterization as the cause of abnormal reaction of the patient, or of later complication, without mention of misadventure at the time of the procedure: Secondary | ICD-10-CM | POA: Diagnosis not present

## 2020-11-09 DIAGNOSIS — T839XXA Unspecified complication of genitourinary prosthetic device, implant and graft, initial encounter: Secondary | ICD-10-CM

## 2020-11-09 MED ORDER — LIDOCAINE HCL URETHRAL/MUCOSAL 2 % EX GEL
1.0000 "application " | Freq: Once | CUTANEOUS | Status: AC
  Administered 2020-11-09: 1 via URETHRAL
  Filled 2020-11-09: qty 11

## 2020-11-09 NOTE — ED Provider Notes (Signed)
[facility] EMERGENCY DEPT Provider Note   CSN: [id number] Arrival date & time: 11/09/20  1459     History Chief Complaint  Patient presents with   catheter leak    urinary    Bryan Robertson is a 70 y.o. male.  Patient here because he is having leaking around his Foley catheter.  Had coud catheter placed several days ago and states that this happens when he has this type of catheter.  He would like it replaced.  He is not having any hematuria.  No abdominal pain.  The history is provided by the patient.  Illness Severity:  Mild Onset quality:  Gradual Timing:  Intermittent Progression:  Waxing and waning Chronicity:  Recurrent Associated symptoms: no abdominal pain, no fever and no shortness of breath       Past Medical History:  Diagnosis Date   Chronic indwelling Foley catheter    High cholesterol    Hypertension    Urinary retention     Patient Active Problem List   Diagnosis Date Noted   Right leg weakness 11/05/2019   Essential hypertension 11/05/2019   Hyperlipidemia 11/05/2019   BPH (benign prostatic hyperplasia) 11/05/2019    History reviewed. No pertinent surgical history.     No family history on file.  Social History   Tobacco Use   Smoking status: Never   Smokeless tobacco: Never  Vaping Use   Vaping Use: Never used  Substance Use Topics   Alcohol use: Not Currently   Drug use: Not Currently    Home Medications Prior to Admission medications   Medication Sig Start Date End Date Taking? Authorizing Provider  atorvastatin (LIPITOR) 20 MG tablet Take 20 mg by mouth at bedtime.  10/21/19   [provider]  brimonidine (ALPHAGAN) 0.2 % ophthalmic solution 1 drop 3 (three) times daily. 09/04/19   [provider]  buPROPion (WELLBUTRIN SR) 150 MG 12 hr tablet  12/11/19   [provider]  carvedilol (COREG) 12.5 MG tablet Take 1 tablet (12.5 mg total) by mouth 2 (two) times daily. 01/30/20    Donato Heinz, MD  carvedilol (COREG) 6.25 MG tablet TAKE 1 TABLET BY MOUTH TWICE A DAY 10/28/20   Donato Heinz, MD  cephALEXin (KEFLEX) 500 MG capsule Take 1 capsule (500 mg total) by mouth 4 (four) times daily. 08/14/20   Quintella Reichert, MD  clonazePAM (KLONOPIN) 0.5 MG tablet Take 0.5 mg by mouth daily as needed. 12/11/19   [provider]  [facility] ASPIRIN 325 MG tablet Take 325 mg by mouth at bedtime.  10/03/19   [provider]  Diroximel Fumarate 231 MG CPDR Take 1 capsule by mouth twice daily for the first 7 days. Then, increase to 2 capsules by mouth twice daily thereafter. 12/05/19   [provider]  docusate sodium (COLACE) 100 MG capsule Take 1 capsule (100 mg total) by mouth every 12 (twelve) hours. 05/12/20   Palumbo, April, MD  dorzolamide-timolol (COSOPT) 22.3-6.8 MG/ML ophthalmic solution Place 1 drop into both eyes 2 (two) times daily.  09/21/19   [provider]  gabapentin (NEURONTIN) 300 MG capsule Take 300 mg by mouth 3 (three) times daily. 12/16/19   [provider]  hydrochlorothiazide (HYDRODIURIL) 12.5 MG tablet Take by mouth.    [provider]  polyethylene glycol (MIRALAX / GLYCOLAX) 17 g packet Take 17 g by mouth daily. 08/24/20   Connell Bognar, DO  RHOPRESSA 0.02 % SOLN Place 1 drop into both eyes  at bedtime.  08/04/19   [provider]  tamsulosin (FLOMAX) 0.4 MG CAPS capsule Take 0.8 mg by mouth at bedtime. 09/29/19   [provider]  traZODone (DESYREL) 50 MG tablet  12/11/19   [provider]    Allergies    Doxycycline  Review of Systems   Review of Systems  Constitutional:  Negative for fever.  Respiratory:  Negative for shortness of breath.   Gastrointestinal:  Negative for abdominal pain.  Genitourinary:  Negative for difficulty urinating, penile discharge, penile pain, penile swelling, scrotal swelling and testicular pain.   Physical Exam Updated Vital Signs BP  (!) 141/80 (BP Location: Right Arm)   Pulse 77   Temp 98.7 F (37.1 C)   Resp 16   Ht 6' (1.829 m)   Wt 77.8 kg   SpO2 100%   BMI 23.26 kg/m   Physical Exam Constitutional:      General: He is not in acute distress.    Appearance: He is not ill-appearing.  Pulmonary:     Effort: Pulmonary effort is normal.  Abdominal:     General: Abdomen is flat.     Tenderness: There is no abdominal tenderness. There is no guarding.  Neurological:     Mental Status: He is alert.    ED Results / Procedures / Treatments   Labs (all labs ordered are listed, but only abnormal results are displayed) Labs Reviewed - No data to display  EKG None  Radiology No results found.  Procedures Procedures   Medications Ordered in ED Medications - No data to display  ED Course  I have reviewed the triage vital signs and the nursing notes.  Pertinent labs & imaging results that were available during my care of the patient were reviewed by me and considered in my medical decision making (see chart for details).    MDM Rules/Calculators/A&P                           Bryan Robertson is here as his chronic indwelling Foley catheter is having leaking around catheter site.  He would like Foley replaced.  Normal vitals.  No fever.  No hematuria.  Foley catheter was replaced without any issues.  Discharged in good condition.  This chart was dictated using voice recognition software.  Despite best efforts to proofread,  errors can occur which can change the documentation meaning.   Final Clinical Impression(s) / ED Diagnoses Final diagnoses:  None    Rx / DC Orders ED Discharge Orders     None        Lennice Sites, DO 11/09/20 1816

## 2020-11-09 NOTE — ED Triage Notes (Signed)
Urinary cath leaking today , burning , no bllod. Placed 3 days ago at the [place].  Hx MS

## 2021-01-10 ENCOUNTER — Other Ambulatory Visit: Payer: Self-pay

## 2021-01-10 ENCOUNTER — Encounter (HOSPITAL_BASED_OUTPATIENT_CLINIC_OR_DEPARTMENT_OTHER): Payer: Self-pay | Admitting: Emergency Medicine

## 2021-01-10 ENCOUNTER — Emergency Department (HOSPITAL_BASED_OUTPATIENT_CLINIC_OR_DEPARTMENT_OTHER)
Admission: EM | Admit: 2021-01-10 | Discharge: 2021-01-10 | Disposition: A | Discharge status: Home / Self Care | Payer: No Typology Code available for payment source | Attending: Emergency Medicine | Admitting: Emergency Medicine

## 2021-01-10 DIAGNOSIS — Y846 Urinary catheterization as the cause of abnormal reaction of the patient, or of later complication, without mention of misadventure at the time of the procedure: Secondary | ICD-10-CM | POA: Insufficient documentation

## 2021-01-10 DIAGNOSIS — Z79899 Other long term (current) drug therapy: Secondary | ICD-10-CM | POA: Diagnosis not present

## 2021-01-10 DIAGNOSIS — B9620 Unspecified Escherichia coli [E. coli] as the cause of diseases classified elsewhere: Secondary | ICD-10-CM | POA: Insufficient documentation

## 2021-01-10 DIAGNOSIS — N3000 Acute cystitis without hematuria: Secondary | ICD-10-CM

## 2021-01-10 DIAGNOSIS — N390 Urinary tract infection, site not specified: Secondary | ICD-10-CM | POA: Insufficient documentation

## 2021-01-10 DIAGNOSIS — I10 Essential (primary) hypertension: Secondary | ICD-10-CM | POA: Diagnosis not present

## 2021-01-10 DIAGNOSIS — T83031A Leakage of indwelling urethral catheter, initial encounter: Secondary | ICD-10-CM | POA: Insufficient documentation

## 2021-01-10 DIAGNOSIS — Z466 Encounter for fitting and adjustment of urinary device: Secondary | ICD-10-CM

## 2021-01-10 LAB — URINALYSIS, MICROSCOPIC (REFLEX): WBC, UA: >50 WBC/hpf (ref 0–5)

## 2021-01-10 LAB — URINALYSIS, ROUTINE W REFLEX MICROSCOPIC
Bilirubin Urine: NEGATIVE
Glucose, UA: NEGATIVE mg/dL
Ketones, ur: NEGATIVE mg/dL
Nitrite: POSITIVE — AB
Protein, ur: NEGATIVE mg/dL
Specific Gravity, Urine: >1.03 — ABNORMAL HIGH (ref 1.005–1.030)
pH: 5.5 (ref 5.0–8.0)

## 2021-01-10 MED ORDER — SULFAMETHOXAZOLE-TRIMETHOPRIM 800-160 MG PO TABS: 1.0000 | ORAL_TABLET | Freq: Two times a day (BID) | ORAL | 0 refills | Status: AC

## 2021-01-10 MED ORDER — LIDOCAINE HCL URETHRAL/MUCOSAL 2 % EX GEL
1.0000 "application " | Freq: Once | CUTANEOUS | Status: AC
  Administered 2021-01-10: 1 via URETHRAL
  Filled 2021-01-10: qty 11

## 2021-01-10 MED ORDER — SULFAMETHOXAZOLE-TRIMETHOPRIM 800-160 MG PO TABS
1.0000 | ORAL_TABLET | Freq: Once | ORAL | Status: AC
  Administered 2021-01-10: 1 via ORAL
  Filled 2021-01-10: qty 1

## 2021-01-10 NOTE — ED Notes (Signed)
Pt was given an extra drainage bag, leg bag in place, pt is aware on why he was given an extra drainage bag.

## 2021-01-10 NOTE — ED Notes (Signed)
PA C wants Coude Cath to be placed.

## 2021-01-10 NOTE — ED Notes (Signed)
Per EDP Dykstra we can place a regular F/C in Pt. Instead of a  Coude

## 2021-01-10 NOTE — ED Provider Notes (Signed)
[facility] EMERGENCY DEPARTMENT Provider Note   CSN: [id number] Arrival date & time: 01/10/21  1733     History Chief Complaint  Patient presents with   Follow-up    Bryan Robertson is a 69 y.o. male.  HPI  Patient with history of urinary retention, chronic indwelling Foley catheter, hypertension presents to the ED due to catheter issues.  He had a coud catheter replaced about a month ago at the [place].  Reports he has been having leakage from the insertion site since then.  Also states his urine is smelling "funny".  Denies any hematuria, not having any abdominal pain nausea or vomiting.  Normal output, followed by [facility] urology.  States he previously had latex catheters and that he prefers them.  He is unsure why he is using coud catheters but states that they are uncomfortable and would prefer to have a latex catheter if possible.  Past Medical History:  Diagnosis Date   Chronic indwelling Foley catheter    High cholesterol    Hypertension    Urinary retention     Patient Active Problem List   Diagnosis Date Noted   Right leg weakness 11/05/2019   Essential hypertension 11/05/2019   Hyperlipidemia 11/05/2019   BPH (benign prostatic hyperplasia) 11/05/2019    History reviewed. No pertinent surgical history.     No family history on file.  Social History   Tobacco Use   Smoking status: Never   Smokeless tobacco: Never  Vaping Use   Vaping Use: Never used  Substance Use Topics   Alcohol use: Not Currently   Drug use: Not Currently    Home Medications Prior to Admission medications   Medication Sig Start Date End Date Taking? Authorizing Provider  atorvastatin (LIPITOR) 20 MG tablet Take 20 mg by mouth at bedtime.  10/21/19   [provider]  brimonidine (ALPHAGAN) 0.2 % ophthalmic solution 1 drop 3 (three) times daily. 09/04/19   [provider]  buPROPion (WELLBUTRIN SR) 150 MG 12 hr tablet  12/11/19   [provider]   carvedilol (COREG) 12.5 MG tablet Take 1 tablet (12.5 mg total) by mouth 2 (two) times daily. 01/30/20   Donato Heinz, MD  carvedilol (COREG) 6.25 MG tablet TAKE 1 TABLET BY MOUTH TWICE A DAY 10/28/20   Donato Heinz, MD  cephALEXin (KEFLEX) 500 MG capsule Take 1 capsule (500 mg total) by mouth 4 (four) times daily. 08/14/20   Quintella Reichert, MD  clonazePAM (KLONOPIN) 0.5 MG tablet Take 0.5 mg by mouth daily as needed. 12/11/19   [provider]  [employer] ASPIRIN 325 MG tablet Take 325 mg by mouth at bedtime.  10/03/19   [provider]  Diroximel Fumarate 231 MG CPDR Take 1 capsule by mouth twice daily for the first 7 days. Then, increase to 2 capsules by mouth twice daily thereafter. 12/05/19   [provider]  docusate sodium (COLACE) 100 MG capsule Take 1 capsule (100 mg total) by mouth every 12 (twelve) hours. 05/12/20   Palumbo, April, MD  dorzolamide-timolol (COSOPT) 22.3-6.8 MG/ML ophthalmic solution Place 1 drop into both eyes 2 (two) times daily.  09/21/19   [provider]  gabapentin (NEURONTIN) 300 MG capsule Take 300 mg by mouth 3 (three) times daily. 12/16/19   [provider]  hydrochlorothiazide (HYDRODIURIL) 12.5 MG tablet Take by mouth.    [provider]  polyethylene glycol (MIRALAX / GLYCOLAX) 17 g packet Take 17 g by mouth daily. 08/24/20  Curatolo, Adam, DO  RHOPRESSA 0.02 % SOLN Place 1 drop into both eyes at bedtime.  08/04/19   [provider]  tamsulosin (FLOMAX) 0.4 MG CAPS capsule Take 0.8 mg by mouth at bedtime. 09/29/19   [provider]  traZODone (DESYREL) 50 MG tablet  12/11/19   [provider]    Allergies    Doxycycline  Review of Systems   Review of Systems  Constitutional:  Negative for fever.  Gastrointestinal:  Negative for abdominal pain, nausea and vomiting.  Genitourinary:  Positive for difficulty urinating and dysuria. Negative for decreased urine volume, flank  pain, frequency, hematuria and penile discharge.   Physical Exam Updated Vital Signs BP (!) 154/87 (BP Location: Left Arm)   Pulse 73   Temp 98.3 F (36.8 C) (Oral)   Resp 18   Ht 6\' 1"  (1.854 m)   Wt 77.1 kg   SpO2 98%   BMI 22.43 kg/m   Physical Exam Vitals and nursing note reviewed. Exam conducted with a chaperone present.  Constitutional:      General: He is not in acute distress.    Appearance: Normal appearance.  HENT:     Head: Normocephalic and atraumatic.  Eyes:     General: No scleral icterus.    Extraocular Movements: Extraocular movements intact.     Pupils: Pupils are equal, round, and reactive to light.  Cardiovascular:     Rate and Rhythm: Normal rate and regular rhythm.  Pulmonary:     Effort: Pulmonary effort is normal.     Breath sounds: Normal breath sounds.  Abdominal:     General: Abdomen is flat.     Palpations: Abdomen is soft.     Tenderness: There is no abdominal tenderness.  Skin:    Coloration: Skin is not jaundiced.  Neurological:     Mental Status: He is alert. Mental status is at baseline.     Coordination: Coordination normal.   ED Results / Procedures / Treatments   Labs (all labs ordered are listed, but only abnormal results are displayed) Labs Reviewed  URINE CULTURE  URINALYSIS, ROUTINE W REFLEX MICROSCOPIC    EKG None  Radiology No results found.  Procedures Procedures   Medications Ordered in ED Medications - No data to display  ED Course  I have reviewed the triage vital signs and the nursing notes.  Pertinent labs & imaging results that were available during my care of the patient were reviewed by me and considered in my medical decision making (see chart for details).    MDM Rules/Calculators/A&P                           Stable vitals, nontoxic-appearing.  No fever, doubt sepsis.  He is having foul-smelling urine, will check a UA and culture.  Patient's catheter replaced without complication.  He reports  improvement in symptoms.  Urine notable for UTI.  Per culture report most recently the previous UTI was susceptible to Bactrim so we will proceed with that antibiotic.  Culture pending.  Patient discharged in stable condition.  Discussed HPI, physical exam and plan of care for this patient with attending Madalyn Rob. The attending physician evaluated this patient as part of a shared visit and agrees with plan of care.   Final Clinical Impression(s) / ED Diagnoses Final diagnoses:  None    Rx / DC Orders ED Discharge Orders     None  Sherrill Raring, PA-C 01/10/21 2054    Lucrezia Starch, MD 01/11/21 [id number]

## 2021-01-10 NOTE — ED Triage Notes (Signed)
Needs his catheter changed , was put in at [place]  3-4  weeks ago states has a coude,  this one leaks around insertions site and states his urine smells bad

## 2021-01-10 NOTE — ED Notes (Signed)
ED Provider at bedside. 

## 2021-01-10 NOTE — ED Notes (Signed)
Pt. Complaining that his Coud Cath is leaking and he wants a regular F/C to be placed in him.  RN sees no leakage when checked.

## 2021-01-10 NOTE — Discharge Instructions (Addendum)
Please follow-up with the urologist in the next week or so if needed. Your catheter is replaced today, urine culture still growing. Take Bactrim twice daily for the next 7 days.

## 2021-01-13 LAB — URINE CULTURE: Culture: >=100000 — AB

## 2021-01-14 ENCOUNTER — Telehealth: Payer: Self-pay | Admitting: Emergency Medicine

## 2021-01-14 NOTE — Telephone Encounter (Signed)
Post ED Visit - Positive Culture Follow-up: Successful Patient Follow-Up  Culture assessed and recommendations reviewed by:  []  Elenor Quinones, Pharm.D. []  Heide Guile, Pharm.D., BCPS AQ-ID []  Parks Neptune, Pharm.D., BCPS []  Alycia Rossetti, Pharm.D., BCPS []  [place], Pharm.D., BCPS, AAHIVP []  Legrand Como, Pharm.D., BCPS, AAHIVP []  Salome Arnt, PharmD, BCPS []  Johnnette Gourd, PharmD, BCPS []  Hughes Better, PharmD, BCPS []  Leeroy Cha, PharmD  Positive urine culture  []  Patient discharged without antimicrobial prescription and treatment is now indicated []  Organism is resistant to prescribed ED discharge antimicrobial []  Patient with positive blood cultures  Changes discussed with ED provider: Dr Wynona Dove New antibiotic prescription symptom check, states afebrile, no other urinary symptoms, only complaint is foul smelling urine Treated with bactrim DS, encouraged to return to ED if symptoms or followup with PCP  Contacted patient, date 01/14/2021, time [place] 01/14/2021, 10:34 AM

## 2021-02-02 ENCOUNTER — Other Ambulatory Visit: Payer: Self-pay | Admitting: Cardiology

## 2021-04-06 NOTE — Progress Notes (Signed)
Cardiology Office Note:    Date:  04/07/2021   ID:  Bryan Robertson, DOB [birth year], MRN [id number]  PCP:  Assunta Curtis, [place]  Cardiologist:  Donato Heinz, MD  Electrophysiologist:  None   Referring MD: Assunta Curtis, FNP   No chief complaint on file.    History of Present Illness:    Bryan Robertson is a 71 y.o. male with a hx of hypertension, hyperlipidemia, multiple sclerosis who presents for follow-up.  He was referred by Luanna Salk, FNP for evaluation of hypertension on 12/01/2019.  He had hospitalization from 10/2 through 11/10/2019.  He had presented with right-sided weakness/numbness and gait difficulty.  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  He was treated with high-dose steroids with improvement.  He was seen by his PCP for lower extremity edema, lower extremity duplex on 10/17/2019 was negative for DVT.  Echocardiogram on 10/20/2019 showed EF 75%, severe LVH with echogenicity suggestive of infiltrative disease, normal RV size/function, moderate left atrial dilatation.  Labs on 10/31/2019 showed hemoglobin 16, platelets 243, creatinine 1.2, potassium 4.1, sodium 140, albumin 4.2, ferritin 166. Exercise Myoview in [place] on 05/25/2019 showed normal perfusion, EF 55%.  He denies any chest pain or dyspnea.  Does report intermittent lightheadedness but denies any syncope.  No palpitations.  He walks with a walker.  Reports intermittent lower extremity edema.  No smoking history.  Family history includes father died of MI in 45s.  Cardiac MRI on 12/25/2019 showed severe asymmetric LVH measuring up to 27 mm and basal anterior septum (7 mm and posterior wall), consistent with hypertrophic cardiomyopathy, RV insertion site LGE consistent with HCM, with LGE accounting for 6% total myocardial mass, normal biventricular function.  He had near syncopal episode had urology appointment on 01/01/2020 while doing a voiding trial.  He was seen in the ED, thought to be  vasovagal syncope.  Zio patch x7 days was done on 01/25/2020, which showed 124 episodes of SVT, longest lasting 3 minutes, 2 episodes of NSVT with longest lasting 6 beats, occasional PVCs (1.1%).  Echocardiogram on 04/03/2020 showed EF 65 to 70%, severe asymmetric LV hypertrophy of the basal septal segment consistent with HCM, no LVOT obstruction.  Since last clinic visit, he reports that he has been doing well. Denies any chest pain, dyspnea, lightheadedness, syncope, lower extremity edema, or palpitations.  Has not been checking BP.  Wt Readings from Last 3 Encounters:  04/07/21 168 lb 12.8 oz (76.6 kg)  01/10/21 170 lb (77.1 kg)  11/09/20 171 lb 8 oz (77.8 kg)   BP Readings from Last 3 Encounters:  04/07/21 (!) 169/90  01/10/21 (!) 152/88  11/09/20 (!) 148/78     Past Medical History:  Diagnosis Date   Chronic indwelling Foley catheter    High cholesterol    Hypertension    Urinary retention     No past surgical history on file.  Current Medications: Current Meds  Medication Sig   atorvastatin (LIPITOR) 20 MG tablet Take 20 mg by mouth at bedtime.    brimonidine (ALPHAGAN) 0.2 % ophthalmic solution 1 drop 3 (three) times daily.   buPROPion (WELLBUTRIN SR) 150 MG 12 hr tablet    carvedilol (COREG) 12.5 MG tablet TAKE 1 TABLET BY MOUTH 2 TIMES DAILY.   cephALEXin (KEFLEX) 500 MG capsule Take 1 capsule (500 mg total) by mouth 4 (four) times daily.   clonazePAM (KLONOPIN) 0.5 MG tablet Take 0.5 mg by mouth daily as needed.   [employer] ASPIRIN 325 MG  tablet Take 325 mg by mouth at bedtime.    Diroximel Fumarate 231 MG CPDR Take 1 capsule by mouth twice daily for the first 7 days. Then, increase to 2 capsules by mouth twice daily thereafter.   docusate sodium (COLACE) 100 MG capsule Take 1 capsule (100 mg total) by mouth every 12 (twelve) hours.   gabapentin (NEURONTIN) 300 MG capsule Take 300 mg by mouth 3 (three) times daily.   polyethylene glycol (MIRALAX / GLYCOLAX) 17 g packet Take  17 g by mouth daily.   RHOPRESSA 0.02 % SOLN Place 1 drop into both eyes at bedtime.    traZODone (DESYREL) 50 MG tablet    [DISCONTINUED] carvedilol (COREG) 6.25 MG tablet TAKE 1 TABLET BY MOUTH TWICE A DAY     Allergies:   Doxycycline   Social History   Socioeconomic History   Marital status: Married    Spouse name: Not on file   Number of children: Not on file   Years of education: Not on file   Highest education level: Not on file  Occupational History   Not on file  Tobacco Use   Smoking status: Never   Smokeless tobacco: Never  Vaping Use   Vaping Use: Never used  Substance and Sexual Activity   Alcohol use: Not Currently   Drug use: Not Currently   Sexual activity: Not Currently  Other Topics Concern   Not on file  Social History Narrative   Not on file   Social Determinants of Health   Financial Resource Strain: Not on file  Food Insecurity: Not on file  Transportation Needs: Not on file  Physical Activity: Not on file  Stress: Not on file  Social Connections: Not on file     Family History: Father died of MI in 34s  ROS:   Please see the history of present illness.     All other systems reviewed and are negative.  EKGs/Labs/Other Studies Reviewed:    The following studies were reviewed today:   EKG:   04/07/21: Normal sinus rhythm, rate 62, less than 1 mm ST depressions in leads I, 2, 3, aVF, V5-6, T wave inversions in leads V1-5, QTc 442   Recent Labs: 08/14/2020: ALT 17; BUN 24; Creatinine, Ser 1.26; Hemoglobin 14.4; Platelets 143; Potassium 3.5; Sodium 140  Recent Lipid Panel No results found for: CHOL, TRIG, HDL, CHOLHDL, VLDL, LDLCALC, LDLDIRECT  Physical Exam:    VS:  BP (!) 169/90    Pulse 62    Ht 6' (1.829 m)    Wt 168 lb 12.8 oz (76.6 kg)    SpO2 100%    BMI 22.89 kg/m     Wt Readings from Last 3 Encounters:  04/07/21 168 lb 12.8 oz (76.6 kg)  01/10/21 170 lb (77.1 kg)  11/09/20 171 lb 8 oz (77.8 kg)     GEN:  in no acute  distress HEENT: Normal NECK: No JVD; No carotid bruits CARDIAC:RRR, no murmurs, rubs, gallops RESPIRATORY:  Clear to auscultation without rales, wheezing or rhonchi  ABDOMEN: Soft, non-tender, non-distended MUSCULOSKELETAL:  No edema; No deformity  SKIN: Warm and dry NEUROLOGIC:  Alert and oriented x 3 PSYCHIATRIC:  Normal affect   ASSESSMENT:    1. Hypertrophic cardiomyopathy ([place])   2. SVT (supraventricular tachycardia) ([place])   3. Essential hypertension   4. Hyperlipidemia, unspecified hyperlipidemia type     PLAN:    HCM: Cardiac MRI on 12/25/2019 showed severe asymmetric LVH measuring up to 27 mm and  basal anterior septum (7 mm and posterior wall), consistent with hypertrophic cardiomyopathy, RV insertion site LGE consistent with HCM, with LGE accounting for 6% total myocardial mass, normal biventricular function.  Echocardiogram 04/03/2020 shows no LVOT obstruction. -Recommend daughters be screened for HCM -Given near syncopal episode, which suspect was vasovagal during voiding trial, Zio patch x7 days was done: showed 2 episodes of NSVT lasting 6 beats. Given HCM with NSVT but age greater than 80, no indication for ICD at this time  SVT: Zio patch x7 days was done on 01/25/2020, which showed 124 episodes of SVT, longest lasting 3 minutes -Continue carvedilol 12.5 mg twice daily  Hypertension: Continue carvedilol 12.5 mg twice daily.  BP elevated in clinic but has reported it has been under better control at home.  Asked to check BP/heart rate twice daily for next week and call with results  Hyperlipidemia: On atorvastatin 20 mg daily.  LDL 76 on 02/23/2019  Multiple sclerosis: Follows with neurology at [facility].  RTC in 6 months   Medication Adjustments/Labs and Tests Ordered: Current medicines are reviewed at length with the patient today.  Concerns regarding medicines are outlined above.  Orders Placed This Encounter  Procedures   EKG 12-Lead    No orders of the  defined types were placed in this encounter.    Patient Instructions  Medication Instructions:  Your physician recommends that you continue on your current medications as directed. Please refer to the Current Medication list given to you today.  *If you need a refill on your cardiac medications before your next appointment, please call your pharmacy*  Follow-Up: At [facility], you and your health needs are our priority.  As part of our continuing mission to provide you with exceptional heart care, we have created designated Provider Care Teams.  These Care Teams include your primary Cardiologist (physician) and Advanced Practice Providers (APPs -  Physician Assistants and Nurse Practitioners) who all work together to provide you with the care you need, when you need it.  We recommend signing up for the patient portal called "MyChart".  Sign up information is provided on this After Visit Summary.  MyChart is used to connect with patients for Virtual Visits (Telemedicine).  Patients are able to view lab/test results, encounter notes, upcoming appointments, etc.  Non-urgent messages can be sent to your provider as well.   To learn more about what you can do with MyChart, go to [email or web address].    Your next appointment:   6 month(s)  The format for your next appointment:   In Person  Provider:   Dr. Gardiner Rhyme    Other Instructions  Dr. Gardiner Rhyme has requested that you check your BP at home twice daily for about 1 week. Please call with readings or send via [place]: Rest 5 minutes before taking your blood pressure. Dont smoke or drink caffeinated beverages for at least 30 minutes before. Take your blood pressure before (not after) you eat. Sit comfortably with your back supported and both feet on the floor (dont cross your legs). Elevate your arm to heart level on a table or a desk. Use the proper sized cuff. It should fit smoothly and  snugly around your bare upper arm. There should be enough room to slip a fingertip under the cuff. The bottom edge of the cuff should be 1 inch above the crease of the elbow. Ideally, take 3 measurements at one sitting and record the average.  Signed, Donato Heinz, MD  04/07/2021 3:59 PM    [facility]

## 2021-04-07 ENCOUNTER — Ambulatory Visit: Payer: Medicare Other | Admitting: Cardiology

## 2021-04-07 ENCOUNTER — Encounter: Payer: Self-pay | Admitting: Cardiology

## 2021-04-07 ENCOUNTER — Other Ambulatory Visit: Payer: Self-pay

## 2021-04-07 VITALS — BP 169/90 | HR 62 | Ht 72.0 in | Wt 168.8 lb

## 2021-04-07 DIAGNOSIS — I471 Supraventricular tachycardia: Secondary | ICD-10-CM

## 2021-04-07 DIAGNOSIS — I422 Other hypertrophic cardiomyopathy: Secondary | ICD-10-CM | POA: Diagnosis not present

## 2021-04-07 DIAGNOSIS — E785 Hyperlipidemia, unspecified: Secondary | ICD-10-CM

## 2021-04-07 DIAGNOSIS — I10 Essential (primary) hypertension: Secondary | ICD-10-CM | POA: Diagnosis not present

## 2021-04-07 NOTE — Patient Instructions (Signed)
Medication Instructions:  ?Your physician recommends that you continue on your current medications as directed. Please refer to the Current Medication list given to you today. ? ?*If you need a refill on your cardiac medications before your next appointment, please call your pharmacy* ? ?Follow-Up: ?At [facility], you and your health needs are our priority.  As part of our continuing mission to provide you with exceptional heart care, we have created designated Provider Care Teams.  These Care Teams include your primary Cardiologist (physician) and Advanced Practice Providers (APPs -  Physician Assistants and Nurse Practitioners) who all work together to provide you with the care you need, when you need it. ? ?We recommend signing up for the patient portal called "MyChart".  Sign up information is provided on this After Visit Summary.  MyChart is used to connect with patients for Virtual Visits (Telemedicine).  Patients are able to view lab/test results, encounter notes, upcoming appointments, etc.  Non-urgent messages can be sent to your provider as well.   ?To learn more about what you can do with MyChart, go to [email or web address].   ? ?Your next appointment:   ?6 month(s) ? ?The format for your next appointment:   ?In Person ? ?Provider:   ?Dr. Gardiner Rhyme ? ? ? ?Other Instructions ? ?Dr. Gardiner Rhyme has requested that you check your BP at home twice daily for about 1 week. Please call with readings or send via MyChart ? ?HOW TO TAKE YOUR BLOOD PRESSURE: ?Rest 5 minutes before taking your blood pressure. ?Don?t smoke or drink caffeinated beverages for at least 30 minutes before. ?Take your blood pressure before (not after) you eat. ?Sit comfortably with your back supported and both feet on the floor (don?t cross your legs). ?Elevate your arm to heart level on a table or a desk. ?Use the proper sized cuff. It should fit smoothly and snugly around your bare upper arm. There should be enough room to slip a  fingertip under the cuff. The bottom edge of the cuff should be 1 inch above the crease of the elbow. ?Ideally, take 3 measurements at one sitting and record the average. ? ? ?

## 2021-04-08 ENCOUNTER — Other Ambulatory Visit: Payer: Self-pay

## 2021-04-08 ENCOUNTER — Emergency Department (HOSPITAL_BASED_OUTPATIENT_CLINIC_OR_DEPARTMENT_OTHER)
Admission: EM | Admit: 2021-04-08 | Discharge: 2021-04-08 | Disposition: A | Discharge status: Home / Self Care | Payer: No Typology Code available for payment source | Attending: Emergency Medicine | Admitting: Emergency Medicine

## 2021-04-08 ENCOUNTER — Encounter (HOSPITAL_BASED_OUTPATIENT_CLINIC_OR_DEPARTMENT_OTHER): Payer: Self-pay | Admitting: Emergency Medicine

## 2021-04-08 DIAGNOSIS — N3001 Acute cystitis with hematuria: Secondary | ICD-10-CM | POA: Insufficient documentation

## 2021-04-08 DIAGNOSIS — Z7982 Long term (current) use of aspirin: Secondary | ICD-10-CM | POA: Insufficient documentation

## 2021-04-08 DIAGNOSIS — R339 Retention of urine, unspecified: Secondary | ICD-10-CM | POA: Diagnosis present

## 2021-04-08 LAB — URINALYSIS, ROUTINE W REFLEX MICROSCOPIC
Bilirubin Urine: NEGATIVE
Glucose, UA: NEGATIVE mg/dL
Ketones, ur: NEGATIVE mg/dL
Nitrite: POSITIVE — AB
Protein, ur: 30 mg/dL — AB
Specific Gravity, Urine: 1.025 (ref 1.005–1.030)
pH: 6 (ref 5.0–8.0)

## 2021-04-08 MED ORDER — CEPHALEXIN 500 MG PO CAPS: 500.0000 mg | ORAL_CAPSULE | Freq: Four times a day (QID) | ORAL | 0 refills | Status: AC

## 2021-04-08 MED ORDER — LIDOCAINE HCL (CARDIAC) PF 100 MG/5ML IV SOSY
PREFILLED_SYRINGE | INTRAVENOUS | Status: AC
  Filled 2021-04-08: qty 5

## 2021-04-08 NOTE — ED Provider Notes (Signed)
?[facility] EMERGENCY DEPT ?Provider Note ? ? ?CSN: [id number] ?Arrival date & time: 04/08/21  1647 ? ?  ? ?History ? ?Chief Complaint  ?Patient presents with  ? foley catheter insertion  ? ? ?Bryan Robertson is a 71 y.o. male with a PMHx of BPH who presents to the ED with complaints of foley catheter insertion issue onset today. His home health nurse attempted to replace his foley today and was not successful. Pt had the foley placed initially due to urinary retention. He is followed by urology at the [facility]. His catheter was last changed by the [facility] last month. Denies hematuria, dysuria, fever, chills, abdominal pain, nausea, vomiting, back pain. Allergic to doxycyline and it causes nausea.  ? ? ? ?The history is provided by the patient. No language interpreter was used.  ? ?  ? ?Home Medications ?Prior to Admission medications   ?Medication Sig Start Date End Date Taking? Authorizing Provider  ?atorvastatin (LIPITOR) 20 MG tablet Take 20 mg by mouth at bedtime.  10/21/19   [provider]  ?brimonidine (ALPHAGAN) 0.2 % ophthalmic solution 1 drop 3 (three) times daily. 09/04/19   [provider]  ?buPROPion [facility] SR) 150 MG 12 hr tablet  12/11/19   [provider]  ?carvedilol (COREG) 12.5 MG tablet TAKE 1 TABLET BY MOUTH 2 TIMES DAILY. 02/04/21   Donato Heinz, MD  ?cephALEXin (KEFLEX) 500 MG capsule Take 1 capsule (500 mg total) by mouth 4 (four) times daily for 7 days. 04/08/21 04/15/21  Alanni Vader A, PA-C  ?clonazePAM (KLONOPIN) 0.5 MG tablet Take 0.5 mg by mouth daily as needed. 12/11/19   [provider]  ?[facility] ASPIRIN 325 MG tablet Take 325 mg by mouth at bedtime.  10/03/19   [provider]  ?Diroximel Fumarate 231 MG CPDR Take 1 capsule by mouth twice daily for the first 7 days. Then, increase to 2 capsules by mouth twice daily thereafter. 12/05/19   [provider]  ?docusate sodium (COLACE) 100 MG capsule Take 1 capsule (100 mg  total) by mouth every 12 (twelve) hours. 05/12/20   Palumbo, April, MD  ?dorzolamide-timolol (COSOPT) 22.3-6.8 MG/ML ophthalmic solution Place 1 drop into both eyes 2 (two) times daily.  ?Patient not taking: Reported on 04/07/2021 09/21/19   [provider]  ?gabapentin (NEURONTIN) 300 MG capsule Take 300 mg by mouth 3 (three) times daily. 12/16/19   [provider]  ?hydrochlorothiazide (HYDRODIURIL) 12.5 MG tablet Take by mouth. ?Patient not taking: Reported on 04/07/2021    [provider]  ?polyethylene glycol (MIRALAX / GLYCOLAX) 17 g packet Take 17 g by mouth daily. 08/24/20   Curatolo, Adam, DO  ?predniSONE (DELTASONE) 20 MG tablet Take by mouth as needed. 12/14/20   [provider]  ?RHOPRESSA 0.02 % SOLN Place 1 drop into both eyes at bedtime.  08/04/19   [provider]  ?tamsulosin (FLOMAX) 0.4 MG CAPS capsule Take 0.8 mg by mouth at bedtime. ?Patient not taking: Reported on 04/07/2021 09/29/19   [provider]  ?traZODone (DESYREL) 50 MG tablet  12/11/19   [provider]  ?   ? ?Allergies    ?Doxycycline   ? ?Review of Systems   ?Review of Systems  ?Constitutional:  Negative for chills and fever.  ?Gastrointestinal:  Negative for abdominal pain, nausea and vomiting.  ?Genitourinary:  Negative for dysuria and hematuria.  ?Musculoskeletal:  Negative for back pain.  ?All other systems reviewed and are negative. ? ?Physical Exam ?  Updated Vital Signs ?BP (!) 160/87 (BP Location: Right Arm)   Pulse 60   Temp 98.1 ?F (36.7 ?C)   Resp 16   Ht 6' (1.829 m)   Wt 76 kg   SpO2 100%   BMI 22.72 kg/m?  ?Physical Exam ?Vitals and nursing note reviewed.  ?Constitutional:   ?   General: He is not in acute distress. ?   Appearance: He is not diaphoretic.  ?HENT:  ?   Head: Normocephalic and atraumatic.  ?   Mouth/Throat:  ?   Pharynx: No oropharyngeal exudate.  ?Eyes:  ?   General: No scleral icterus. ?   Conjunctiva/sclera: Conjunctivae normal.  ?Cardiovascular:   ?   Rate and Rhythm: Normal rate and regular rhythm.  ?   Pulses: Normal pulses.  ?   Heart sounds: Normal heart sounds.  ?Pulmonary:  ?   Effort: Pulmonary effort is normal. No respiratory distress.  ?   Breath sounds: Normal breath sounds. No wheezing.  ?Abdominal:  ?   General: Bowel sounds are normal.  ?   Palpations: Abdomen is soft. There is no mass.  ?   Tenderness: There is no abdominal tenderness. There is no right CVA tenderness, left CVA tenderness, guarding or rebound.  ?   Comments: No abdominal TTP. No CVA TTP noted bilaterally.   ?Musculoskeletal:     ?   General: Normal range of motion.  ?   Cervical back: Normal range of motion and neck supple.  ?Skin: ?   General: Skin is warm and dry.  ?Neurological:  ?   Mental Status: He is alert.  ?Psychiatric:     ?   Behavior: Behavior normal.  ? ? ?ED Results / Procedures / Treatments   ?Labs ?(all labs ordered are listed, but only abnormal results are displayed) ?Labs Reviewed  ?URINALYSIS, ROUTINE W REFLEX MICROSCOPIC - Abnormal; Notable for the following components:  ?    Result Value  ? APPearance HAZY (*)   ? Hgb urine dipstick LARGE (*)   ? Protein, ur 30 (*)   ? Nitrite POSITIVE (*)   ? Leukocytes,Ua LARGE (*)   ? Bacteria, UA MANY (*)   ? All other components within normal limits  ?URINE CULTURE  ? ? ?EKG ?None ? ?Radiology ?No results found. ? ?Procedures ?Procedures  ? ? ?Medications Ordered in ED ?Medications - No data to display ? ?ED Course/ Medical Decision Making/ A&P ?Clinical Course as of 04/09/21 0210  ?Tue Apr 08, 2021  ?2010 Foley catheter drained prior to discharge by RN.  [SB]  ?2029 Per staff, patients catheter was out PTA to the ED. Catheter was replaced in the ED and urine specimen was obtained from that. Will culture urine.  [SB]  ?2033 Discussed discharge treatment plan with patient and spouse at bedside. Pt appears safe for discharge. [SB]  ?  ?Clinical Course User Index ?[SB] Corie Vavra A, PA-C  ? ?                         ?Medical Decision Making ?Amount and/or Complexity of Data Reviewed ?Labs: ordered. ? ?Risk ?Prescription drug management. ? ? ?Patient presents to the ED with concerns for foley catheter issue. [facility]. Pt has a hx of BPH. Vitals signs, stable, pt afebrile. On exam patient without acute cardiovascular, respiratory, or abdominal exam findings. No CVA TTP. Differential diagnosis includes acute  cystitis, nephrolithiasis, pyelonephritis.  ? ? ?Labs:  ?I ordered, and personally interpreted labs.  The pertinent results include:   ?Urinalysis notable for large hemoglobin, positive nitrate, large amount of leukocytes. ?Urine culture ordered with results pending at time of discharge. ? ?Disposition: ?Pt presentation suspicious for acute cystitis with hematuria. Doubt kidney stone or pyelonephritis at this time. Foley replaced in the ED without difficulty. Foley noted to be draining after replacement. Will send urine culture. After consideration of the diagnostic results and the patients response to treatment, I feel that the patient would benefit from Discharge home. Will send Rx for Keflex due to UTI noted on urinalysis. Supportive care measures and strict return precautions discussed with patient at bedside. Pt acknowledges and verbalizes understanding. Pt appears safe for discharge. Follow up as indicated in discharge paperwork. ? ?This chart was dictated using voice recognition software, Dragon. Despite the best efforts of this provider to proofread and correct errors, errors may still occur which can change documentation meaning. ? ?Final Clinical Impression(s) / ED Diagnoses ?Final diagnoses:  ?Acute cystitis with hematuria  ? ? ?Rx / DC Orders ?ED Discharge Orders   ? ?      Ordered  ?  cephALEXin (KEFLEX) 500 MG capsule  4 times daily       ? 04/08/21 2035  ? ?  ?  ? ?  ? ? ?  ?Bernadetta Roell A, PA-C ?04/09/21 0211 ? ?  ?Charlesetta Shanks, MD ?04/11/21  1507 ? ?

## 2021-04-08 NOTE — ED Triage Notes (Signed)
Pt reports a home health nurse came to insert a foley catheter today but was unsuccessful. Pt had foley removed at 3. ?

## 2021-04-08 NOTE — Discharge Instructions (Addendum)
It was a pleasure taking care of you today!  ? ?Your urine was notable for a UTI. You will be sent a prescription for Keflex, take as prescribed. Ensure that you complete the entire course of antibiotic. Ensure to maintain fluid intake.  You may follow-up with your primary care provider as needed.  Return to the emergency department if you are experiencing increasing or worsening abdominal pain, urinary symptoms, fever, or decreased fluid intake. ?

## 2021-04-11 LAB — URINE CULTURE: Culture: >=100000 — AB

## 2021-04-12 ENCOUNTER — Telehealth (HOSPITAL_BASED_OUTPATIENT_CLINIC_OR_DEPARTMENT_OTHER): Payer: Self-pay | Admitting: *Deleted

## 2021-04-12 NOTE — Telephone Encounter (Signed)
Post ED Visit - Positive Culture Follow-up: Successful Patient Follow-Up ? ?Culture assessed and recommendations reviewed by: ? ?'[x]'$  Lorelei Pont Pharm.D. ?'[]'$  Heide Guile, Pharm.D., BCPS AQ-ID ?'[]'$  Parks Neptune, Pharm.D., BCPS ?'[]'$  Alycia Rossetti, Pharm.D., BCPS ?'[]'$  [place], Pharm.D., BCPS, AAHIVP ?'[]'$  Legrand Como, Pharm.D., BCPS, AAHIVP ?'[]'$  Salome Arnt, PharmD, BCPS ?'[]'$  Johnnette Gourd, PharmD, BCPS ?'[]'$  Hughes Better, PharmD, BCPS ?'[]'$  Leeroy Cha, PharmD ? ?Positive urine culture ? ?'[]'$  Patient discharged without antimicrobial prescription and treatment is now indicated ?'[x]'$  Organism is resistant to prescribed ED discharge antimicrobial ?'[]'$  Patient with positive blood cultures ? ?Changes discussed with ED provider: Elmo Putt cockerham,PA ?New antibiotic prescription Cefdinir '300mg'$  BID x 10 days ?Called to [facility], [place], [place] ? ?Contacted patient, date 04/12/21, time 1355 ? ? ?Rosie Fate ?04/12/2021, 2:12 PM ? ?  ?

## 2021-04-12 NOTE — Progress Notes (Signed)
ED Antimicrobial Stewardship Positive Culture Follow Up  ? ?Bryan Robertson is an 71 y.o. male who presented to [facility] on 04/08/2021 with a chief complaint of  ?Chief Complaint  ?Patient presents with  ? foley catheter insertion  ? ? ?Recent Results (from the past 720 hour(s))  ?Urine Culture     Status: Abnormal  ? Collection Time: 04/08/21  5:19 PM  ? Specimen: Urine, Clean Catch  ?Result Value Ref Range Status  ? Specimen Description   Final  ?  URINE, CLEAN CATCH ?Performed at [facility], [address] [place], [place] [address] ?  ? Special Requests   Final  ?  NONE ?Performed at [facility], [address] [place], [place] [address] ?  ? Culture >=100,000 COLONIES/mL ESCHERICHIA COLI (A)  Final  ? Report Status 04/11/2021 FINAL  Final  ? Organism ID, Bacteria ESCHERICHIA COLI (A)  Final  ?    Susceptibility  ? Escherichia coli - MIC*  ?  AMPICILLIN >=32 RESISTANT Resistant   ?  CEFAZOLIN >=64 RESISTANT Resistant   ?  CEFEPIME <=0.12 SENSITIVE Sensitive   ?  CEFTRIAXONE <=0.25 SENSITIVE Sensitive   ?  CIPROFLOXACIN <=0.25 SENSITIVE Sensitive   ?  GENTAMICIN <=1 SENSITIVE Sensitive   ?  IMIPENEM <=0.25 SENSITIVE Sensitive   ?  NITROFURANTOIN <=16 SENSITIVE Sensitive   ?  TRIMETH/SULFA >=320 RESISTANT Resistant   ?  AMPICILLIN/SULBACTAM RESISTANT Resistant   ?  PIP/TAZO <=4 SENSITIVE Sensitive   ?  * >=100,000 COLONIES/mL ESCHERICHIA COLI  ? ? ?'[x]'$  Treated with keflex, organism resistant to prescribed antimicrobial ? ?New antibiotic prescription: Cefdinir 300 mg BID x 10 days (Qty 20; Refills 0) ? ?ED Provider: Dorise Bullion PA ? ? ?Lorelei Pont, PharmD, BCPS ?04/12/2021 11:04 AM ?ED Clinical Pharmacist -  [phone] ? ? ?

## 2021-10-08 ENCOUNTER — Ambulatory Visit: Payer: No Typology Code available for payment source | Admitting: Cardiology

## 2021-10-12 NOTE — Progress Notes (Deleted)
Cardiology Office Note:    Date:  10/12/2021   ID:  Bryan Robertson, DOB [birth year], MRN [id number]  PCP:  Bryan Shiner, MD  Cardiologist:  Bryan Heinz, MD  Electrophysiologist:  None   Referring MD: Bryan Curtis, FNP   No chief complaint on file.    History of Present Illness:    Bryan Robertson is a 71 y.o. male with a hx of hypertension, hyperlipidemia, multiple sclerosis who presents for follow-up.  He was referred by Bryan Salk, FNP for evaluation of hypertension on 12/01/2019.  He had hospitalization from 10/2 through 11/10/2019.  He had presented with right-sided weakness/numbness and gait difficulty.  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  He was treated with high-dose steroids with improvement.  He was seen by his PCP for lower extremity edema, lower extremity duplex on 10/17/2019 was negative for DVT.  Echocardiogram on 10/20/2019 showed EF 75%, severe LVH with echogenicity suggestive of infiltrative disease, normal RV size/function, moderate left atrial dilatation.  Labs on 10/31/2019 showed hemoglobin 16, platelets 243, creatinine 1.2, potassium 4.1, sodium 140, albumin 4.2, ferritin 166. Exercise Myoview in [place] on 05/25/2019 showed normal perfusion, EF 55%.  He denies any chest pain or dyspnea.  Does report intermittent lightheadedness but denies any syncope.  No palpitations.  He walks with a walker.  Reports intermittent lower extremity edema.  No smoking history.  Family history includes father died of MI in 60s.  Cardiac MRI on 12/25/2019 showed severe asymmetric LVH measuring up to 27 mm and basal anterior septum (7 mm and posterior wall), consistent with hypertrophic cardiomyopathy, RV insertion site LGE consistent with HCM, with LGE accounting for 6% total myocardial mass, normal biventricular function.  He had near syncopal episode had urology appointment on 01/01/2020 while doing a voiding trial.  He was seen in the ED, thought to be  vasovagal syncope.  Zio patch x7 days was done on 01/25/2020, which showed 124 episodes of SVT, longest lasting 3 minutes, 2 episodes of NSVT with longest lasting 6 beats, occasional PVCs (1.1%).  Echocardiogram on 04/03/2020 showed EF 65 to 70%, severe asymmetric LV hypertrophy of the basal septal segment consistent with HCM, no LVOT obstruction.  Since last clinic visit, he reports that he has been doing well. Denies any chest pain, dyspnea, lightheadedness, syncope, lower extremity edema, or palpitations.  Has not been checking BP.  Wt Readings from Last 3 Encounters:  04/08/21 167 lb 8.8 oz (76 kg)  04/07/21 168 lb 12.8 oz (76.6 kg)  01/10/21 170 lb (77.1 kg)   BP Readings from Last 3 Encounters:  04/08/21 (!) 160/87  04/07/21 (!) 169/90  01/10/21 (!) 152/88     Past Medical History:  Diagnosis Date   Chronic indwelling Foley catheter    High cholesterol    Hypertension    Urinary retention     No past surgical history on file.  Current Medications: No outpatient medications have been marked as taking for the 10/13/21 encounter (Appointment) with Bryan Heinz, MD.     Allergies:   Doxycycline   Social History   Socioeconomic History   Marital status: Married    Spouse name: Not on file   Number of children: Not on file   Years of education: Not on file   Highest education level: Not on file  Occupational History   Not on file  Tobacco Use   Smoking status: Never   Smokeless tobacco: Never  Vaping Use   Vaping Use: Never  used  Substance and Sexual Activity   Alcohol use: Not Currently   Drug use: Not Currently   Sexual activity: Not Currently  Other Topics Concern   Not on file  Social History Narrative   Not on file   Social Determinants of Health   Financial Resource Strain: Not on file  Food Insecurity: Not on file  Transportation Needs: Not on file  Physical Activity: Not on file  Stress: Not on file  Social Connections: Not on file      Family History: Father died of MI in 52s  ROS:   Please see the history of present illness.     All other systems reviewed and are negative.  EKGs/Labs/Other Studies Reviewed:    The following studies were reviewed today:   EKG:   04/07/21: Normal sinus rhythm, rate 62, less than 1 mm ST depressions in leads I, 2, 3, aVF, V5-6, T wave inversions in leads V1-5, QTc 442   Recent Labs: No results found for requested labs within last 365 days.  Recent Lipid Panel No results found for: "CHOL", "TRIG", "HDL", "CHOLHDL", "VLDL", "LDLCALC", "LDLDIRECT"  Physical Exam:    VS:  There were no vitals taken for this visit.    Wt Readings from Last 3 Encounters:  04/08/21 167 lb 8.8 oz (76 kg)  04/07/21 168 lb 12.8 oz (76.6 kg)  01/10/21 170 lb (77.1 kg)     GEN:  in no acute distress HEENT: Normal NECK: No JVD; No carotid bruits CARDIAC:RRR, no murmurs, rubs, gallops RESPIRATORY:  Clear to auscultation without rales, wheezing or rhonchi  ABDOMEN: Soft, non-tender, non-distended MUSCULOSKELETAL:  No edema; No deformity  SKIN: Warm and dry NEUROLOGIC:  Alert and oriented x 3 PSYCHIATRIC:  Normal affect   ASSESSMENT:    No diagnosis found.   PLAN:    HCM: Cardiac MRI on 12/25/2019 showed severe asymmetric LVH measuring up to 27 mm and basal anterior septum (7 mm and posterior wall), consistent with hypertrophic cardiomyopathy, RV insertion site LGE consistent with HCM, with LGE accounting for 6% total myocardial mass, normal biventricular function.  Echocardiogram 04/03/2020 shows no LVOT obstruction. -Recommend daughters be screened for HCM -Given near syncopal episode, which suspect was vasovagal during voiding trial, Zio patch x7 days was done: showed 2 episodes of NSVT lasting 6 beats. Given HCM with NSVT but age greater than 52, no indication for ICD at this time  SVT: Zio patch x7 days was done on 01/25/2020, which showed 124 episodes of SVT, longest lasting 3  minutes -Continue carvedilol 12.5 mg twice daily  Hypertension: Continue carvedilol 12.5 mg twice daily.  BP elevated in clinic but has reported it has been under better control at home.  Asked to check BP/heart rate twice daily for next week and call with results  Hyperlipidemia: On atorvastatin 20 mg daily.  LDL 76 on 02/23/2019  Multiple sclerosis: Follows with neurology at [facility].  RTC in 6 months   Medication Adjustments/Labs and Tests Ordered: Current medicines are reviewed at length with the patient today.  Concerns regarding medicines are outlined above.  No orders of the defined types were placed in this encounter.   No orders of the defined types were placed in this encounter.    There are no Patient Instructions on file for this visit.   Signed, Bryan Heinz, MD  10/12/2021 8:23 PM    [place]

## 2021-10-13 ENCOUNTER — Ambulatory Visit: Payer: No Typology Code available for payment source | Attending: Cardiology | Admitting: Cardiology

## 2021-12-05 IMAGING — CT CT RENAL STONE PROTOCOL
1 of 2 series · 15 of 32 positions shown, 19 images · non-contrast
Comparison: CT 11/05/2019

CLINICAL DATA: Lower abdominal pain

EXAM:
CT ABDOMEN AND PELVIS WITHOUT CONTRAST
TECHNIQUE: Multidetector CT imaging of the abdomen and pelvis was performed
following the standard protocol without IV contrast.

[Series 2: stone full · axial · 0.79mm/px · z∈[+885,+1350]mm · 15 of 103 slices shown, 19 images]
[im 5/103  soft-tissue]
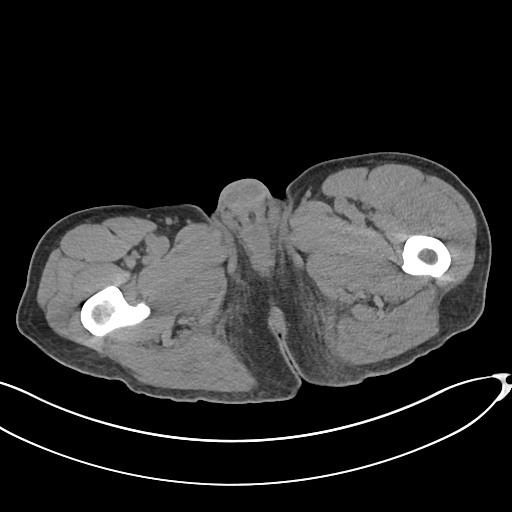
[im 5/103  bone]
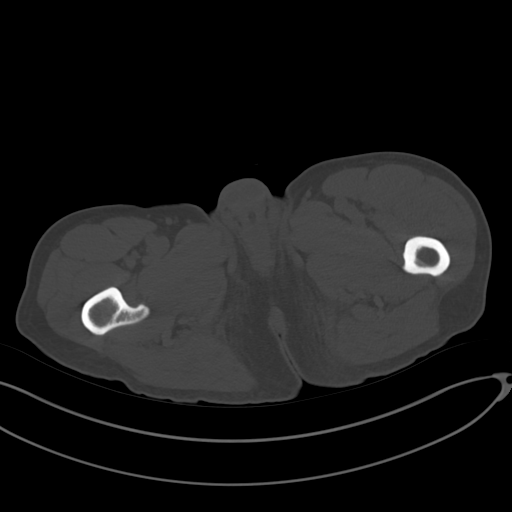
[im 13/103  soft-tissue]
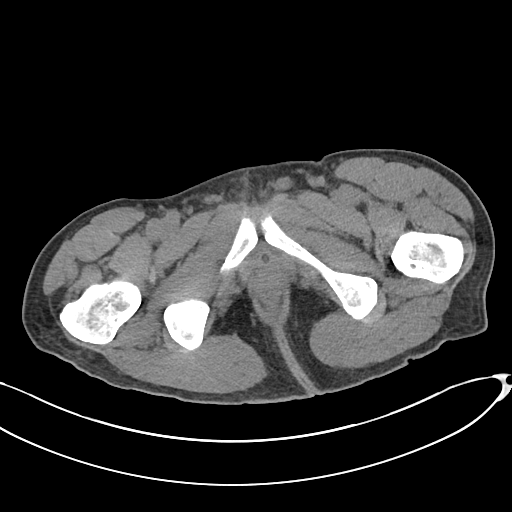
[im 21/103  soft-tissue]
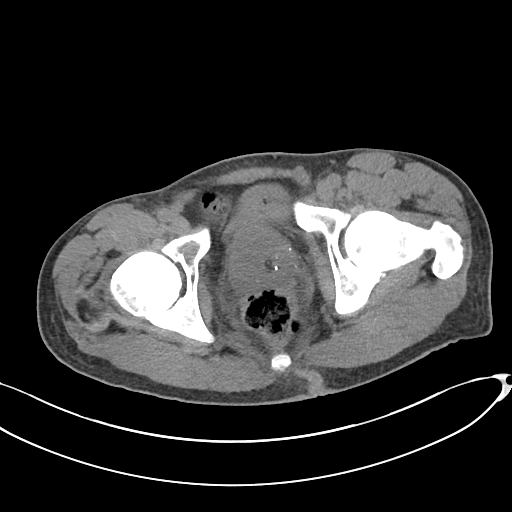
[im 29/103  soft-tissue]
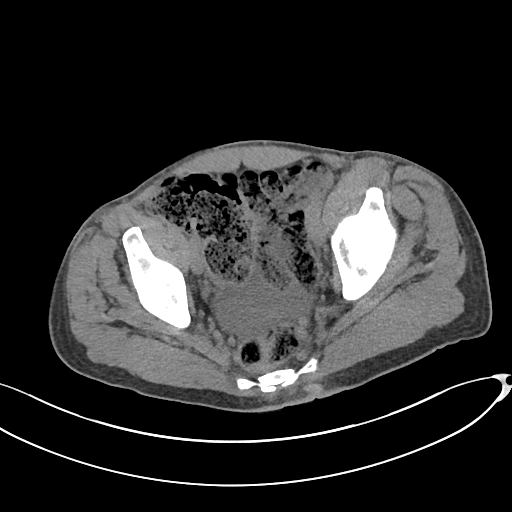
[im 37/103  soft-tissue]
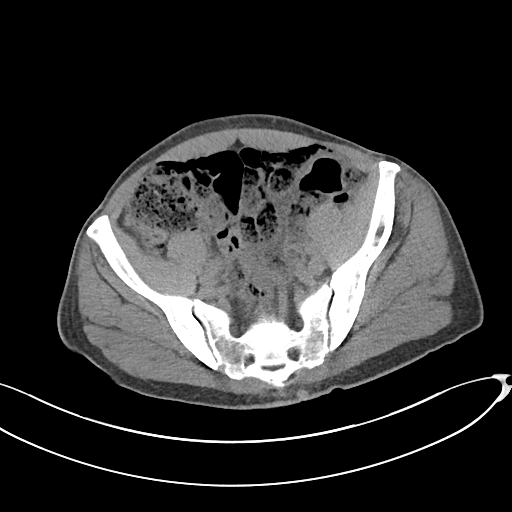
[im 45/103  soft-tissue]
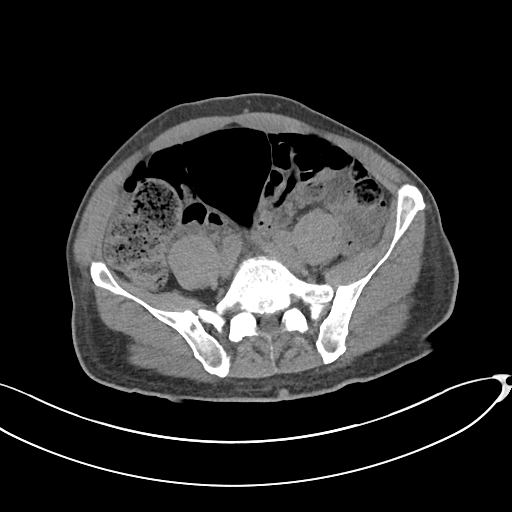
[im 54/103  soft-tissue]
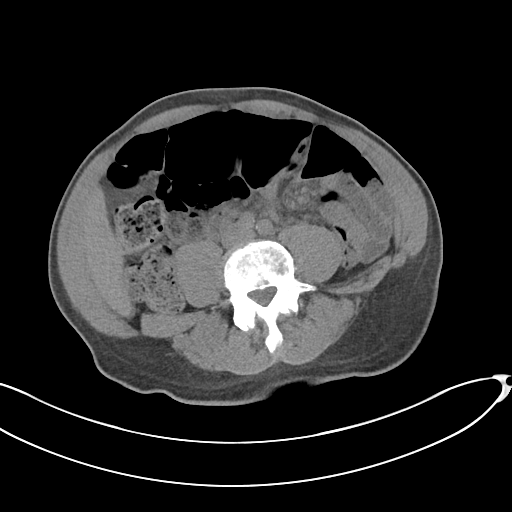
[im 58/103  soft-tissue]
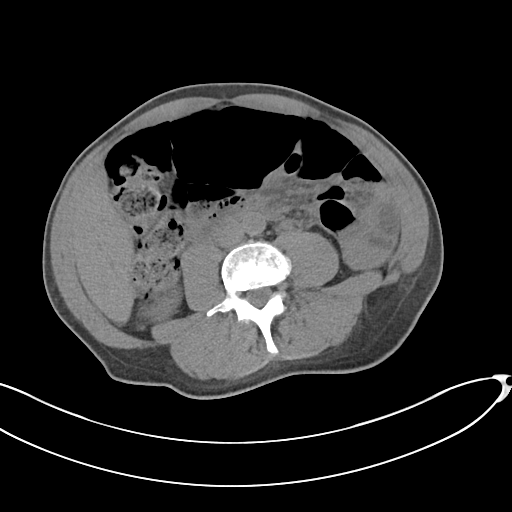
[im 66/103  soft-tissue]
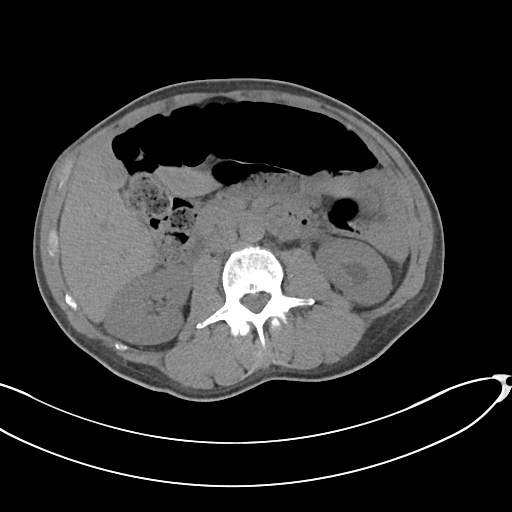
[im 66/103  bone]
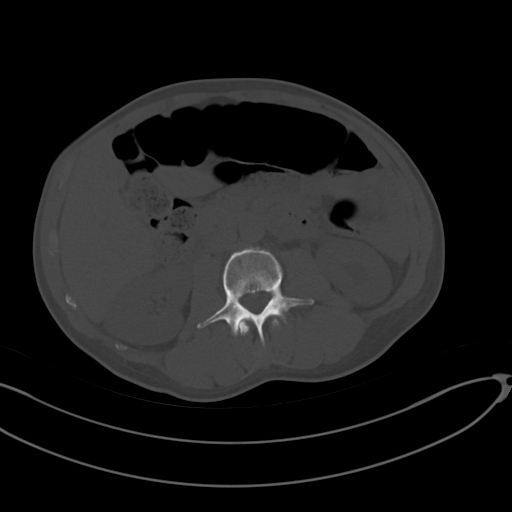
[im 74/103  soft-tissue]
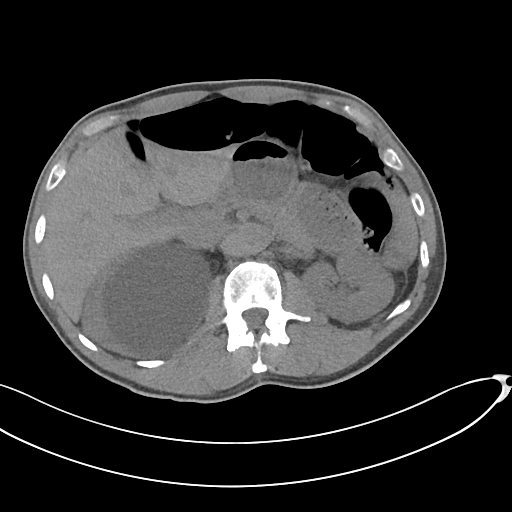
[im 82/103  soft-tissue]
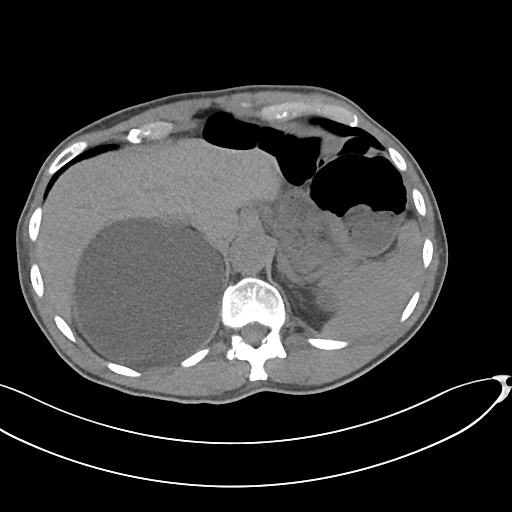
[im 86/103  lung]
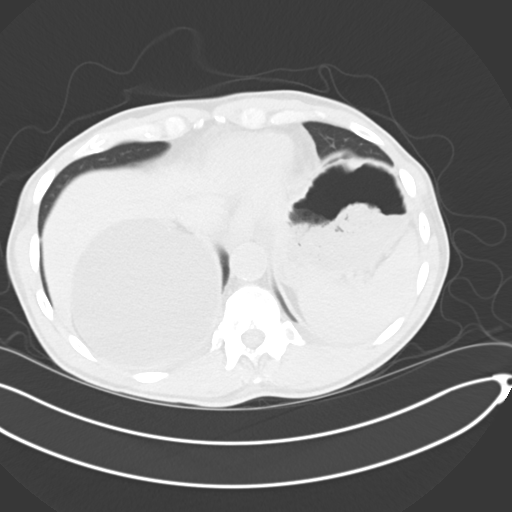
[im 90/103  soft-tissue]
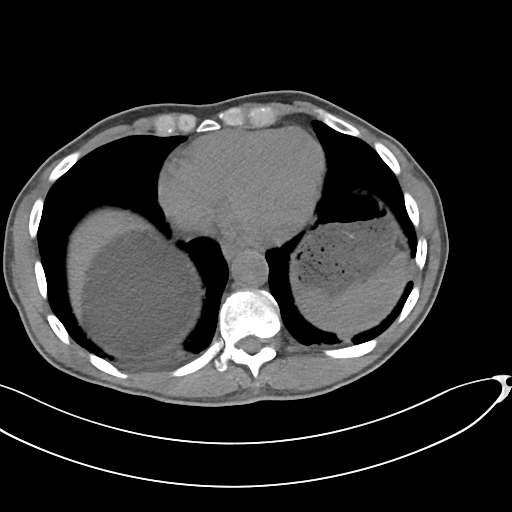
[im 90/103  lung]
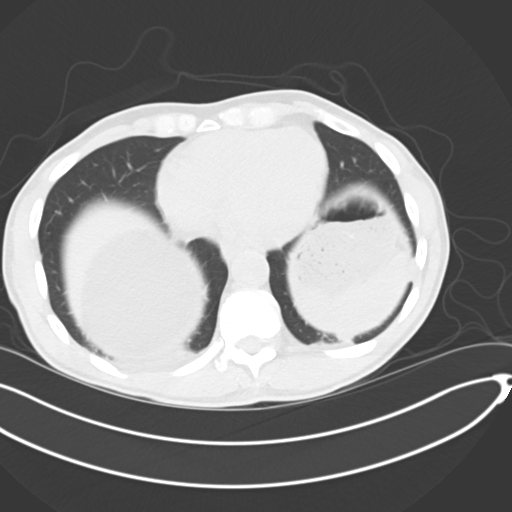
[im 94/103  lung]
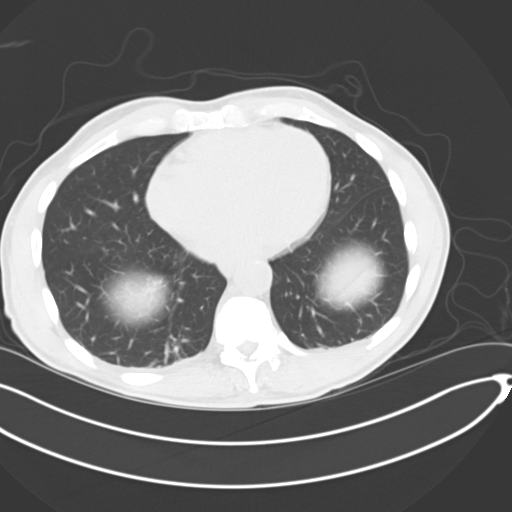
[im 98/103  soft-tissue]
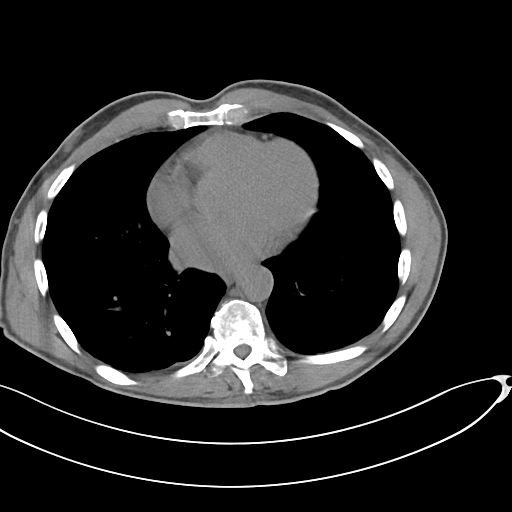
[im 98/103  lung]
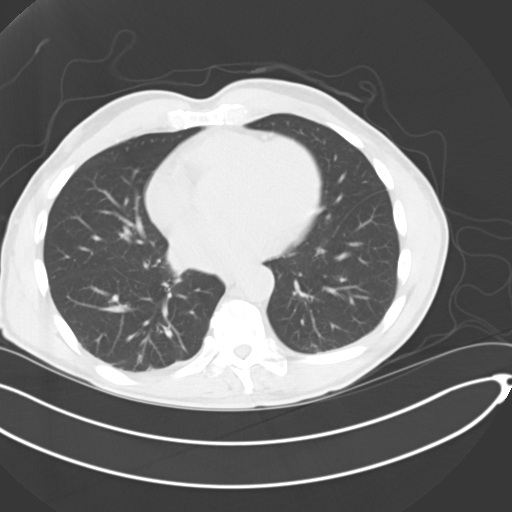

[15 of 32 positions shown; findings below may reference images not displayed]

FINDINGS: Lower chest: Lung bases demonstrate no acute consolidation or
effusion.

Hepatobiliary: Contracted gallbladder. No calcified stone. No
biliary dilatation or focal hepatic abnormality.

Pancreas: Unremarkable. No pancreatic ductal dilatation or
surrounding inflammatory changes.

Spleen: Normal in size without focal abnormality.

Adrenals/Urinary Tract: Left adrenal gland is normal. Right adrenal
gland difficult to see due to large cyst. No hydronephrosis. Foley
catheter within the urinary bladder which is empty. Large cyst in
the right kidney measuring up to 12.7 cm.

Stomach/Bowel: The stomach is nonenlarged. There is no dilated small
bowel. Mild air distension of colon within the anterior abdomen.
Large volume of stools in the colon. No acute bowel wall thickening.

Vascular/Lymphatic: Mild aortic atherosclerosis. No aneurysm. No
suspicious nodes

Reproductive: Multiple prostate seeds. Possible thickening and
inflammation at the base of prostate and about the seminal vesicles.

Other: Small free fluid in the pelvis.  No free air.

Musculoskeletal: No acute or suspicious osseous abnormality
IMPRESSION: 1. Negative for hydronephrosis or ureteral stone.
2. Large volume of stools in the colon, question constipation. No
acute bowel wall thickening. Some air distended colon but no
convincing evidence for an obstruction.
3. Post treatment changes of the prostate. Question inflammation at
the base of prostate and about seminal vesicles.
4. Small free fluid in the pelvis of uncertain source.
5. Large right renal cyst measuring up to 12.7 cm.

Aortic Atherosclerosis (C7PC0-Q35.5).

## 2022-01-01 ENCOUNTER — Ambulatory Visit: Payer: No Typology Code available for payment source | Admitting: Cardiology

## 2022-01-01 NOTE — Progress Notes (Deleted)
Cardiology Office Note:    Date:  01/01/2022   ID:  Bryan Robertson, DOB 04-12-1950, MRN [id number]  PCP:  Delorise Shiner, MD  Cardiologist:  Donato Heinz, MD  Electrophysiologist:  None   Referring MD: Delorise Shiner, MD   No chief complaint on file.    History of Present Illness:    Bryan Robertson is a 71 y.o. male with a hx of hypertension, hyperlipidemia, multiple sclerosis who presents for follow-up.  He was referred by Luanna Salk, FNP for evaluation of hypertension on 12/01/2019.  He had hospitalization from 10/2 through 11/10/2019.  He had presented with right-sided weakness/numbness and gait difficulty.  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  He was treated with high-dose steroids with improvement.  He was seen by his PCP for lower extremity edema, lower extremity duplex on 10/17/2019 was negative for DVT.  Echocardiogram on 10/20/2019 showed EF 75%, severe LVH with echogenicity suggestive of infiltrative disease, normal RV size/function, moderate left atrial dilatation.  Labs on 10/31/2019 showed hemoglobin 16, platelets 243, creatinine 1.2, potassium 4.1, sodium 140, albumin 4.2, ferritin 166. Exercise Myoview in [place] on 05/25/2019 showed normal perfusion, EF 55%.  He denies any chest pain or dyspnea.  Does report intermittent lightheadedness but denies any syncope.  No palpitations.  He walks with a walker.  Reports intermittent lower extremity edema.  No smoking history.  Family history includes father died of MI in 43s.  Cardiac MRI on 12/25/2019 showed severe asymmetric LVH measuring up to 27 mm and basal anterior septum (7 mm and posterior wall), consistent with hypertrophic cardiomyopathy, RV insertion site LGE consistent with HCM, with LGE accounting for 6% total myocardial mass, normal biventricular function.  He had near syncopal episode had urology appointment on 01/01/2020 while doing a voiding trial.  He was seen in the ED, thought to be  vasovagal syncope.  Zio patch x7 days was done on 01/25/2020, which showed 124 episodes of SVT, longest lasting 3 minutes, 2 episodes of NSVT with longest lasting 6 beats, occasional PVCs (1.1%).  Echocardiogram on 04/03/2020 showed EF 65 to 70%, severe asymmetric LV hypertrophy of the basal septal segment consistent with HCM, no LVOT obstruction.  Since last clinic visit,  he reports that he has been doing well. Denies any chest pain, dyspnea, lightheadedness, syncope, lower extremity edema, or palpitations.  Has not been checking BP.  Wt Readings from Last 3 Encounters:  04/08/21 167 lb 8.8 oz (76 kg)  04/07/21 168 lb 12.8 oz (76.6 kg)  01/10/21 170 lb (77.1 kg)   BP Readings from Last 3 Encounters:  04/08/21 (!) 160/87  04/07/21 (!) 169/90  01/10/21 (!) 152/88     Past Medical History:  Diagnosis Date   Chronic indwelling Foley catheter    High cholesterol    Hypertension    Urinary retention     No past surgical history on file.  Current Medications: No outpatient medications have been marked as taking for the 01/01/22 encounter (Appointment) with Donato Heinz, MD.     Allergies:   Doxycycline   Social History   Socioeconomic History   Marital status: Married    Spouse name: Not on file   Number of children: Not on file   Years of education: Not on file   Highest education level: Not on file  Occupational History   Not on file  Tobacco Use   Smoking status: Never   Smokeless tobacco: Never  Vaping Use   Vaping Use:  Never used  Substance and Sexual Activity   Alcohol use: Not Currently   Drug use: Not Currently   Sexual activity: Not Currently  Other Topics Concern   Not on file  Social History Narrative   Not on file   Social Determinants of Health   Financial Resource Strain: Not on file  Food Insecurity: Not on file  Transportation Needs: Not on file  Physical Activity: Not on file  Stress: Not on file  Social Connections: Not on file      Family History: Father died of MI in 74s  ROS:   Please see the history of present illness.     All other systems reviewed and are negative.  EKGs/Labs/Other Studies Reviewed:    The following studies were reviewed today:   EKG:   04/07/21: Normal sinus rhythm, rate 62, less than 1 mm ST depressions in leads I, 2, 3, aVF, V5-6, T wave inversions in leads V1-5, QTc 442   Recent Labs: No results found for requested labs within last 365 days.  Recent Lipid Panel No results found for: "CHOL", "TRIG", "HDL", "CHOLHDL", "VLDL", "LDLCALC", "LDLDIRECT"  Physical Exam:    VS:  There were no vitals taken for this visit.    Wt Readings from Last 3 Encounters:  04/08/21 167 lb 8.8 oz (76 kg)  04/07/21 168 lb 12.8 oz (76.6 kg)  01/10/21 170 lb (77.1 kg)     GEN:  in no acute distress HEENT: Normal NECK: No JVD; No carotid bruits CARDIAC:RRR, no murmurs, rubs, gallops RESPIRATORY:  Clear to auscultation without rales, wheezing or rhonchi  ABDOMEN: Soft, non-tender, non-distended MUSCULOSKELETAL:  No edema; No deformity  SKIN: Warm and dry NEUROLOGIC:  Alert and oriented x 3 PSYCHIATRIC:  Normal affect   ASSESSMENT:    No diagnosis found.   PLAN:    HCM: Cardiac MRI on 12/25/2019 showed severe asymmetric LVH measuring up to 27 mm and basal anterior septum (7 mm and posterior wall), consistent with hypertrophic cardiomyopathy, RV insertion site LGE consistent with HCM, with LGE accounting for 6% total myocardial mass, normal biventricular function.  Echocardiogram 04/03/2020 shows no LVOT obstruction. -Recommend daughters be screened for HCM -Given near syncopal episode, which suspect was vasovagal during voiding trial, Zio patch x7 days was done: showed 2 episodes of NSVT lasting 6 beats. Given HCM with NSVT but age greater than 70, no indication for ICD at this time  SVT: Zio patch x7 days was done on 01/25/2020, which showed 124 episodes of SVT, longest lasting 3  minutes -Continue carvedilol 12.5 mg twice daily  Hypertension: Continue carvedilol 12.5 mg twice daily.  BP elevated in clinic but has reported it has been under better control at home.  Asked to check BP/heart rate twice daily for next week and call with results  Hyperlipidemia: On atorvastatin 20 mg daily.  LDL 76 on 02/23/2019  Multiple sclerosis: Follows with neurology at [facility].  RTC in 6 months   Medication Adjustments/Labs and Tests Ordered: Current medicines are reviewed at length with the patient today.  Concerns regarding medicines are outlined above.  No orders of the defined types were placed in this encounter.   No orders of the defined types were placed in this encounter.    There are no Patient Instructions on file for this visit.   Signed, Donato Heinz, MD  01/01/2022 8:49 AM    [facility]

## 2022-01-04 NOTE — Progress Notes (Deleted)
Cardiology Office Note:    Date:  01/01/2022   ID:  Bryan Robertson, DOB 1950-12-23, MRN [id number]  PCP:  Delorise Shiner, MD  Cardiologist:  Donato Heinz, MD  Electrophysiologist:  None   Referring MD: Delorise Shiner, MD   No chief complaint on file.    History of Present Illness:    Bryan Robertson is a 71 y.o. male with a hx of hypertension, hyperlipidemia, multiple sclerosis who presents for follow-up.  He was referred by Luanna Salk, FNP for evaluation of hypertension on 12/01/2019.  He had hospitalization from 10/2 through 11/10/2019.  He had presented with right-sided weakness/numbness and gait difficulty.  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  He was treated with high-dose steroids with improvement.  He was seen by his PCP for lower extremity edema, lower extremity duplex on 10/17/2019 was negative for DVT.  Echocardiogram on 10/20/2019 showed EF 75%, severe LVH with echogenicity suggestive of infiltrative disease, normal RV size/function, moderate left atrial dilatation.  Labs on 10/31/2019 showed hemoglobin 16, platelets 243, creatinine 1.2, potassium 4.1, sodium 140, albumin 4.2, ferritin 166. Exercise Myoview in [place] on 05/25/2019 showed normal perfusion, EF 55%.  He denies any chest pain or dyspnea.  Does report intermittent lightheadedness but denies any syncope.  No palpitations.  He walks with a walker.  Reports intermittent lower extremity edema.  No smoking history.  Family history includes father died of MI in 80s.  Cardiac MRI on 12/25/2019 showed severe asymmetric LVH measuring up to 27 mm and basal anterior septum (7 mm and posterior wall), consistent with hypertrophic cardiomyopathy, RV insertion site LGE consistent with HCM, with LGE accounting for 6% total myocardial mass, normal biventricular function.  He had near syncopal episode had urology appointment on 01/01/2020 while doing a voiding trial.  He was seen in the ED, thought to be  vasovagal syncope.  Zio patch x7 days was done on 01/25/2020, which showed 124 episodes of SVT, longest lasting 3 minutes, 2 episodes of NSVT with longest lasting 6 beats, occasional PVCs (1.1%).  Echocardiogram on 04/03/2020 showed EF 65 to 70%, severe asymmetric LV hypertrophy of the basal septal segment consistent with HCM, no LVOT obstruction.  Since last clinic visit,  he reports that he has been doing well. Denies any chest pain, dyspnea, lightheadedness, syncope, lower extremity edema, or palpitations.  Has not been checking BP.  Wt Readings from Last 3 Encounters:  04/08/21 167 lb 8.8 oz (76 kg)  04/07/21 168 lb 12.8 oz (76.6 kg)  01/10/21 170 lb (77.1 kg)   BP Readings from Last 3 Encounters:  04/08/21 (!) 160/87  04/07/21 (!) 169/90  01/10/21 (!) 152/88     Past Medical History:  Diagnosis Date   Chronic indwelling Foley catheter    High cholesterol    Hypertension    Urinary retention     No past surgical history on file.  Current Medications: No outpatient medications have been marked as taking for the 01/01/22 encounter (Appointment) with Donato Heinz, MD.     Allergies:   Doxycycline   Social History   Socioeconomic History   Marital status: Married    Spouse name: Not on file   Number of children: Not on file   Years of education: Not on file   Highest education level: Not on file  Occupational History   Not on file  Tobacco Use   Smoking status: Never   Smokeless tobacco: Never  Vaping Use   Vaping Use:  Never used  Substance and Sexual Activity   Alcohol use: Not Currently   Drug use: Not Currently   Sexual activity: Not Currently  Other Topics Concern   Not on file  Social History Narrative   Not on file   Social Determinants of Health   Financial Resource Strain: Not on file  Food Insecurity: Not on file  Transportation Needs: Not on file  Physical Activity: Not on file  Stress: Not on file  Social Connections: Not on file      Family History: Father died of MI in 40s  ROS:   Please see the history of present illness.     All other systems reviewed and are negative.  EKGs/Labs/Other Studies Reviewed:    The following studies were reviewed today:   EKG:   04/07/21: Normal sinus rhythm, rate 62, less than 1 mm ST depressions in leads I, 2, 3, aVF, V5-6, T wave inversions in leads V1-5, QTc 442   Recent Labs: No results found for requested labs within last 365 days.  Recent Lipid Panel No results found for: "CHOL", "TRIG", "HDL", "CHOLHDL", "VLDL", "LDLCALC", "LDLDIRECT"  Physical Exam:    VS:  There were no vitals taken for this visit.    Wt Readings from Last 3 Encounters:  04/08/21 167 lb 8.8 oz (76 kg)  04/07/21 168 lb 12.8 oz (76.6 kg)  01/10/21 170 lb (77.1 kg)     GEN:  in no acute distress HEENT: Normal NECK: No JVD; No carotid bruits CARDIAC:RRR, no murmurs, rubs, gallops RESPIRATORY:  Clear to auscultation without rales, wheezing or rhonchi  ABDOMEN: Soft, non-tender, non-distended MUSCULOSKELETAL:  No edema; No deformity  SKIN: Warm and dry NEUROLOGIC:  Alert and oriented x 3 PSYCHIATRIC:  Normal affect   ASSESSMENT:    No diagnosis found.   PLAN:    HCM: Cardiac MRI on 12/25/2019 showed severe asymmetric LVH measuring up to 27 mm and basal anterior septum (7 mm and posterior wall), consistent with hypertrophic cardiomyopathy, RV insertion site LGE consistent with HCM, with LGE accounting for 6% total myocardial mass, normal biventricular function.  Echocardiogram 04/03/2020 shows no LVOT obstruction. -Recommend daughters be screened for HCM -Given near syncopal episode, which suspect was vasovagal during voiding trial, Zio patch x7 days was done: showed 2 episodes of NSVT lasting 6 beats. Given HCM with NSVT but age greater than 6, no indication for ICD at this time  SVT: Zio patch x7 days was done on 01/25/2020, which showed 124 episodes of SVT, longest lasting 3  minutes -Continue carvedilol 12.5 mg twice daily  Hypertension: Continue carvedilol 12.5 mg twice daily.  BP elevated in clinic but has reported it has been under better control at home.  Asked to check BP/heart rate twice daily for next week and call with results  Hyperlipidemia: On atorvastatin 20 mg daily.  LDL 76 on 02/23/2019  Multiple sclerosis: Follows with neurology at [facility].  RTC in 6 months   Medication Adjustments/Labs and Tests Ordered: Current medicines are reviewed at length with the patient today.  Concerns regarding medicines are outlined above.  No orders of the defined types were placed in this encounter.   No orders of the defined types were placed in this encounter.    There are no Patient Instructions on file for this visit.   Signed, Donato Heinz, MD  01/01/2022 8:49 AM    [facility]

## 2022-01-05 ENCOUNTER — Ambulatory Visit: Payer: No Typology Code available for payment source | Admitting: Cardiology

## 2022-01-06 ENCOUNTER — Ambulatory Visit: Payer: No Typology Code available for payment source | Admitting: Cardiology

## 2022-03-19 IMAGING — DX DG CHEST 1V PORT
1 series · 1 of 1 positions shown · non-contrast
Comparison: CT of the chest on 11/05/2019

CLINICAL DATA: Cough.

EXAM:
PORTABLE CHEST 1 VIEW

[chest]
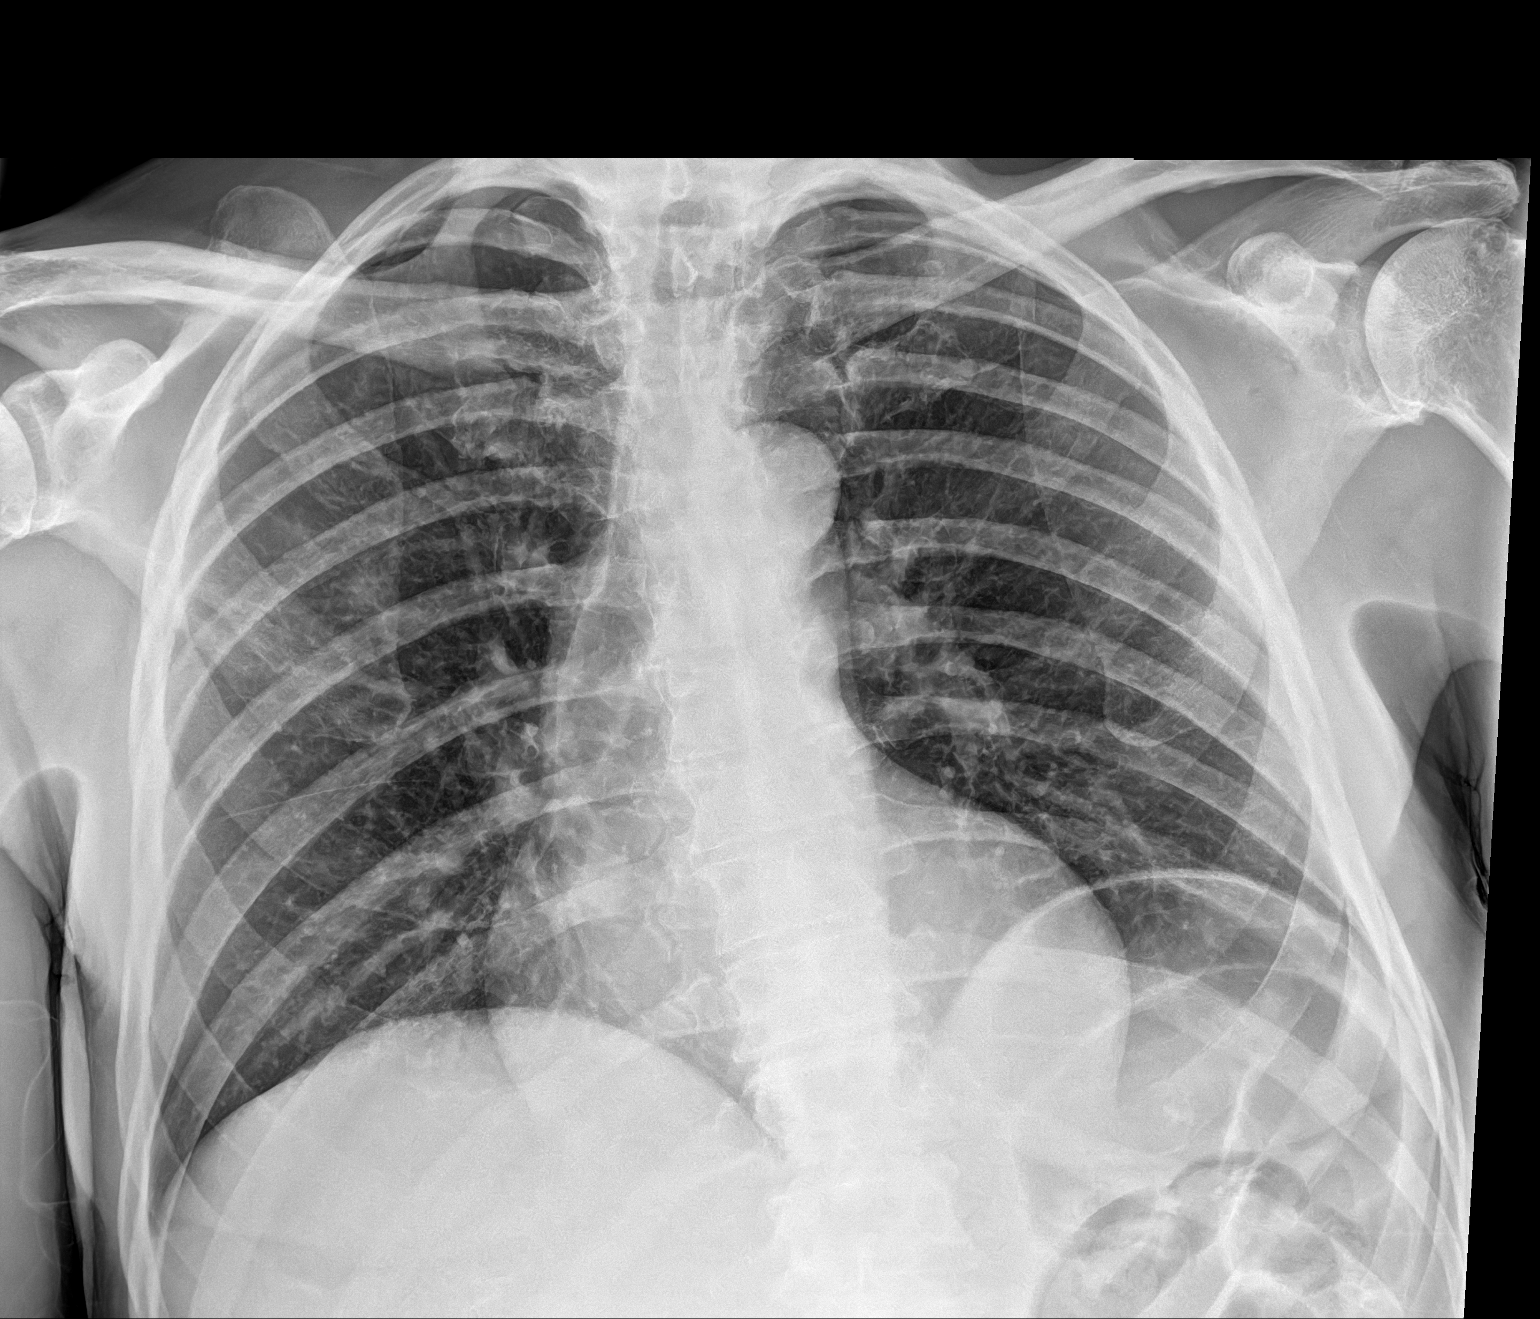

[1 of 1 positions shown; findings below may reference images not displayed]

FINDINGS: The heart size and mediastinal contours are within normal limits.
There is no evidence of pulmonary edema, consolidation, pneumothorax
or pleural fluid. The visualized skeletal structures are
unremarkable.
IMPRESSION: No active disease.

## 2022-03-22 ENCOUNTER — Other Ambulatory Visit: Payer: Self-pay | Admitting: Cardiology

## 2022-03-26 ENCOUNTER — Ambulatory Visit: Payer: No Typology Code available for payment source | Attending: Cardiology | Admitting: Cardiology

## 2022-03-26 ENCOUNTER — Encounter: Payer: Self-pay | Admitting: Cardiology

## 2022-03-26 VITALS — BP 148/72 | HR 77 | Ht 72.0 in | Wt 184.4 lb

## 2022-03-26 DIAGNOSIS — I471 Supraventricular tachycardia, unspecified: Secondary | ICD-10-CM | POA: Diagnosis not present

## 2022-03-26 DIAGNOSIS — I10 Essential (primary) hypertension: Secondary | ICD-10-CM | POA: Diagnosis not present

## 2022-03-26 DIAGNOSIS — I422 Other hypertrophic cardiomyopathy: Secondary | ICD-10-CM | POA: Diagnosis not present

## 2022-03-26 MED ORDER — CARVEDILOL 25 MG PO TABS: 25.0000 mg | ORAL_TABLET | Freq: Two times a day (BID) | ORAL | 3 refills | Status: DC

## 2022-03-26 NOTE — Patient Instructions (Signed)
Medication Instructions:  INCREASE carvedilol (Coreg) 25 mg two times daily  Please check your blood pressure at home daily, write it down.  Call the office or send message via Mychart with the readings in 2 weeks for Dr. Gardiner Rhyme to review.   *If you need a refill on your cardiac medications before your next appointment, please call your pharmacy*  Follow-Up: At [facility], you and your health needs are our priority.  As part of our continuing mission to provide you with exceptional heart care, we have created designated Provider Care Teams.  These Care Teams include your primary Cardiologist (physician) and Advanced Practice Providers (APPs -  Physician Assistants and Nurse Practitioners) who all work together to provide you with the care you need, when you need it.  We recommend signing up for the patient portal called "MyChart".  Sign up information is provided on this After Visit Summary.  MyChart is used to connect with patients for Virtual Visits (Telemedicine).  Patients are able to view lab/test results, encounter notes, upcoming appointments, etc.  Non-urgent messages can be sent to your provider as well.   To learn more about what you can do with MyChart, go to [email or web address].    Your next appointment:   6 month(s)  Provider:   Donato Heinz, MD

## 2022-03-26 NOTE — Progress Notes (Signed)
Cardiology Office Note:    Date:  03/26/2022   ID:  Bryan Robertson, DOB 1950/11/25, MRN [id number]  PCP:  Delorise Shiner, MD  Cardiologist:  Donato Heinz, MD  Electrophysiologist:  None   Referring MD: Delorise Shiner, MD   Chief Complaint  Patient presents with   Cardiomyopathy     History of Present Illness:    Bryan Robertson is a 72 y.o. male with a hx of hypertension, hyperlipidemia, multiple sclerosis who presents for follow-up.  He was referred by Luanna Salk, FNP for evaluation of hypertension on 12/01/2019.  He had hospitalization from 10/2 through 11/10/2019.  He had presented with right-sided weakness/numbness and gait difficulty.  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  He was treated with high-dose steroids with improvement.  He was seen by his PCP for lower extremity edema, lower extremity duplex on 10/17/2019 was negative for DVT.  Echocardiogram on 10/20/2019 showed EF 75%, severe LVH with echogenicity suggestive of infiltrative disease, normal RV size/function, moderate left atrial dilatation.  Labs on 10/31/2019 showed hemoglobin 16, platelets 243, creatinine 1.2, potassium 4.1, sodium 140, albumin 4.2, ferritin 166. Exercise Myoview in [place] on 05/25/2019 showed normal perfusion, EF 55%.  He denies any chest pain or dyspnea.  Does report intermittent lightheadedness but denies any syncope.  No palpitations.  He walks with a walker.  Reports intermittent lower extremity edema.  No smoking history.  Family history includes father died of MI in 53s.  Cardiac MRI on 12/25/2019 showed severe asymmetric LVH measuring up to 27 mm and basal anterior septum (7 mm and posterior wall), consistent with hypertrophic cardiomyopathy, RV insertion site LGE consistent with HCM, with LGE accounting for 6% total myocardial mass, normal biventricular function.  He had near syncopal episode had urology appointment on 01/01/2020 while doing a voiding trial.  He was  seen in the ED, thought to be vasovagal syncope.  Zio patch x7 days was done on 01/25/2020, which showed 124 episodes of SVT, longest lasting 3 minutes, 2 episodes of NSVT with longest lasting 6 beats, occasional PVCs (1.1%).  Echocardiogram on 04/03/2020 showed EF 65 to 70%, severe asymmetric LV hypertrophy of the basal septal segment consistent with HCM, no LVOT obstruction.  Since last clinic visit, he reports he is doing okay.  Denies any chest pain, dyspnea, lightheadedness, syncope, or palpitations.  Does report some lower extremity edema   Wt Readings from Last 3 Encounters:  03/26/22 184 lb 6.4 oz (83.6 kg)  04/08/21 167 lb 8.8 oz (76 kg)  04/07/21 168 lb 12.8 oz (76.6 kg)   BP Readings from Last 3 Encounters:  03/26/22 (!) 148/72  04/08/21 (!) 160/87  04/07/21 (!) 169/90     Past Medical History:  Diagnosis Date   Chronic indwelling Foley catheter    High cholesterol    Hypertension    Urinary retention     No past surgical history on file.  Current Medications: Current Meds  Medication Sig   atorvastatin (LIPITOR) 20 MG tablet Take 20 mg by mouth at bedtime.    baclofen (LIORESAL) 10 MG tablet Take 1 tablet by mouth 3 (three) times daily.   brimonidine (ALPHAGAN) 0.2 % ophthalmic solution 1 drop 3 (three) times daily.   buPROPion (WELLBUTRIN SR) 150 MG 12 hr tablet    clonazePAM (KLONOPIN) 0.5 MG tablet Take 0.5 mg by mouth daily as needed.   [facility] ASPIRIN 325 MG tablet Take 325 mg by mouth at bedtime.    Diroximel Fumarate (  VUMERITY) 231 MG CPDR Take 2 tablet in the morning and 2 tablets in the evening   Diroximel Fumarate 231 MG CPDR Take 1 capsule by mouth twice daily for the first 7 days. Then, increase to 2 capsules by mouth twice daily thereafter.   docusate sodium (COLACE) 100 MG capsule Take 1 capsule (100 mg total) by mouth every 12 (twelve) hours.   dorzolamide-timolol (COSOPT) 22.3-6.8 MG/ML ophthalmic solution Place 1 drop into both eyes 2 (two) times daily.    gabapentin (NEURONTIN) 300 MG capsule Take 300 mg by mouth 3 (three) times daily.   polyethylene glycol (MIRALAX / GLYCOLAX) 17 g packet Take 17 g by mouth daily.   predniSONE (DELTASONE) 20 MG tablet Take by mouth as needed.   RHOPRESSA 0.02 % SOLN Place 1 drop into both eyes at bedtime.    traZODone (DESYREL) 50 MG tablet    [DISCONTINUED] carvedilol (COREG) 12.5 MG tablet TAKE 1 TABLET BY MOUTH TWICE A DAY   [DISCONTINUED] hydrochlorothiazide (HYDRODIURIL) 12.5 MG tablet Take by mouth.   [DISCONTINUED] tamsulosin (FLOMAX) 0.4 MG CAPS capsule Take 0.8 mg by mouth at bedtime.     Allergies:   Doxycycline   Social History   Socioeconomic History   Marital status: Married    Spouse name: Not on file   Number of children: Not on file   Years of education: Not on file   Highest education level: Not on file  Occupational History   Not on file  Tobacco Use   Smoking status: Never   Smokeless tobacco: Never  Vaping Use   Vaping Use: Never used  Substance and Sexual Activity   Alcohol use: Not Currently   Drug use: Not Currently   Sexual activity: Not Currently  Other Topics Concern   Not on file  Social History Narrative   Not on file   Social Determinants of Health   Financial Resource Strain: Not on file  Food Insecurity: Not on file  Transportation Needs: Not on file  Physical Activity: Not on file  Stress: Not on file  Social Connections: Not on file     Family History: Father died of MI in 48s  ROS:   Please see the history of present illness.     All other systems reviewed and are negative.  EKGs/Labs/Other Studies Reviewed:    The following studies were reviewed today:   EKG:   04/07/21: Normal sinus rhythm, rate 62, less than 1 mm ST depressions in leads I, 2, 3, aVF, V5-6, T wave inversions in leads V1-5, QTc 442 03/26/22: Normal sinus rhythm, LVH with repolarization abnormalities, rate 77   Recent Labs: No results found for requested labs within  last 365 days.  Recent Lipid Panel No results found for: "CHOL", "TRIG", "HDL", "CHOLHDL", "VLDL", "LDLCALC", "LDLDIRECT"  Physical Exam:    VS:  BP (!) 148/72   Pulse 77   Ht 6' (1.829 m)   Wt 184 lb 6.4 oz (83.6 kg)   SpO2 96%   BMI 25.01 kg/m     Wt Readings from Last 3 Encounters:  03/26/22 184 lb 6.4 oz (83.6 kg)  04/08/21 167 lb 8.8 oz (76 kg)  04/07/21 168 lb 12.8 oz (76.6 kg)     GEN:  in no acute distress HEENT: Normal NECK: No JVD; No carotid bruits CARDIAC:RRR, no murmurs, rubs, gallops RESPIRATORY:  Clear to auscultation without rales, wheezing or rhonchi  ABDOMEN: Soft, non-tender, non-distended MUSCULOSKELETAL: Trace edema; No deformity  SKIN: Warm and  dry NEUROLOGIC:  Alert and oriented x 3 PSYCHIATRIC:  Normal affect   ASSESSMENT:    1. Hypertrophic cardiomyopathy ([place])   2. SVT (supraventricular tachycardia)   3. Essential hypertension      PLAN:    HCM: Cardiac MRI on 12/25/2019 showed severe asymmetric LVH measuring up to 27 mm and basal anterior septum (7 mm and posterior wall), consistent with hypertrophic cardiomyopathy, RV insertion site LGE consistent with HCM, with LGE accounting for 6% total myocardial mass, normal biventricular function.  Echocardiogram 04/03/2020 shows no LVOT obstruction. -Recommend daughters be screened for HCM -Given near syncopal episode, which suspect was vasovagal during voiding trial, Zio patch x7 days was done: showed 2 episodes of NSVT lasting 6 beats. Given HCM with NSVT but age greater than 38, no indication for ICD at this time  SVT: Zio patch x7 days was done on 01/25/2020, which showed 124 episodes of SVT, longest lasting 3 minutes -Continue carvedilol   Hypertension: BP elevated, will increase carvedilol to 25 mg twice daily.  Asked to check blood pressure daily for next 2 weeks and call with results  Hyperlipidemia: On atorvastatin 20 mg daily.  LDL 76 on 02/23/2019  Multiple sclerosis: Follows with  neurology at [facility]  RTC in 6 months   Medication Adjustments/Labs and Tests Ordered: Current medicines are reviewed at length with the patient today.  Concerns regarding medicines are outlined above.  Orders Placed This Encounter  Procedures   EKG 12-Lead    Meds ordered this encounter  Medications   carvedilol (COREG) 25 MG tablet    Sig: Take 1 tablet (25 mg total) by mouth 2 (two) times daily.    Dispense:  180 tablet    Refill:  3    Dose increase     Patient Instructions  Medication Instructions:  INCREASE carvedilol (Coreg) 25 mg two times daily  Please check your blood pressure at home daily, write it down.  Call the office or send message via Mychart with the readings in 2 weeks for Dr. Gardiner Rhyme to review.   *If you need a refill on your cardiac medications before your next appointment, please call your pharmacy*  Follow-Up: At [facility], you and your health needs are our priority.  As part of our continuing mission to provide you with exceptional heart care, we have created designated Provider Care Teams.  These Care Teams include your primary Cardiologist (physician) and Advanced Practice Providers (APPs -  Physician Assistants and Nurse Practitioners) who all work together to provide you with the care you need, when you need it.  We recommend signing up for the patient portal called "MyChart".  Sign up information is provided on this After Visit Summary.  MyChart is used to connect with patients for Virtual Visits (Telemedicine).  Patients are able to view lab/test results, encounter notes, upcoming appointments, etc.  Non-urgent messages can be sent to your provider as well.   To learn more about what you can do with MyChart, go to [email or web address].    Your next appointment:   6 month(s)  Provider:   Donato Heinz, MD        Signed, Donato Heinz, MD  03/26/2022 5:37 PM    [place]

## 2022-07-27 ENCOUNTER — Other Ambulatory Visit: Payer: Self-pay | Admitting: Cardiology

## 2022-08-07 ENCOUNTER — Encounter (HOSPITAL_BASED_OUTPATIENT_CLINIC_OR_DEPARTMENT_OTHER): Payer: Self-pay | Admitting: Emergency Medicine

## 2022-08-07 ENCOUNTER — Emergency Department (HOSPITAL_BASED_OUTPATIENT_CLINIC_OR_DEPARTMENT_OTHER)
Admission: EM | Admit: 2022-08-07 | Discharge: 2022-08-07 | Disposition: A | Discharge status: Home / Self Care | Payer: No Typology Code available for payment source | Attending: Emergency Medicine | Admitting: Emergency Medicine

## 2022-08-07 ENCOUNTER — Other Ambulatory Visit: Payer: Self-pay

## 2022-08-07 DIAGNOSIS — I10 Essential (primary) hypertension: Secondary | ICD-10-CM | POA: Insufficient documentation

## 2022-08-07 DIAGNOSIS — T83098A Other mechanical complication of other indwelling urethral catheter, initial encounter: Secondary | ICD-10-CM | POA: Diagnosis present

## 2022-08-07 DIAGNOSIS — Z79899 Other long term (current) drug therapy: Secondary | ICD-10-CM | POA: Insufficient documentation

## 2022-08-07 DIAGNOSIS — R82998 Other abnormal findings in urine: Secondary | ICD-10-CM | POA: Diagnosis not present

## 2022-08-07 DIAGNOSIS — R399 Unspecified symptoms and signs involving the genitourinary system: Secondary | ICD-10-CM

## 2022-08-07 LAB — URINALYSIS, W/ REFLEX TO CULTURE (INFECTION SUSPECTED)
Bilirubin Urine: NEGATIVE
Glucose, UA: NEGATIVE mg/dL
Ketones, ur: NEGATIVE mg/dL
Nitrite: POSITIVE — AB
Specific Gravity, Urine: 1.022 (ref 1.005–1.030)
WBC, UA: >50 WBC/hpf (ref 0–5)
pH: 7 (ref 5.0–8.0)

## 2022-08-07 MED ORDER — LIDOCAINE HCL URETHRAL/MUCOSAL 2 % EX GEL
1.0000 | Freq: Once | CUTANEOUS | Status: AC
  Administered 2022-08-07: 1 via URETHRAL
  Filled 2022-08-07: qty 11

## 2022-08-07 NOTE — ED Provider Notes (Signed)
[name] Provider Note   CSN: [id number] Arrival date & time: 08/07/22  1541     History {Add pertinent medical, surgical, social history, OB history to HPI:1} Chief Complaint  Patient presents with   Catheter Leaking    Bryan Robertson is a 72 y.o. male with a history of multiple sclerosis, BPH, and hypertension who presents to the ED today for leaking catheter.  Reports that about a week ago he had his catheter changed at the [place] and since then it has been leaking at the site of insertion.  He reports that he followed-up with their office today and they inflated the balloon and then sent him home.  Patient reports that his catheter is still leaking after that.  Additionally he reports malodorous urine.  He states that he has had an issue with this for the past several years.  He is spoken with his primary care and urologist about that and they told him to drink more water, but he reports that that has not helped. He reports a burning sensation with urination but denies fever, back pain, or hematuria. No other complaints or concerns at this time.    Home Medications Prior to Admission medications   Medication Sig Start Date End Date Taking? Authorizing Provider  atorvastatin (LIPITOR) 20 MG tablet Take 20 mg by mouth at bedtime.  10/21/19   [provider]  baclofen (LIORESAL) 10 MG tablet Take 1 tablet by mouth 3 (three) times daily. 02/18/22   [provider]  brimonidine (ALPHAGAN) 0.2 % ophthalmic solution 1 drop 3 (three) times daily. 09/04/19   [provider]  buPROPion (WELLBUTRIN SR) 150 MG 12 hr tablet  12/11/19   [provider]  carvedilol (COREG) 25 MG tablet Take 1 tablet (25 mg total) by mouth 2 (two) times daily. 03/26/22   Little Ishikawa, MD  clonazePAM (KLONOPIN) 0.5 MG tablet Take 0.5 mg by mouth daily as needed. 12/11/19   [provider]  [facility] ASPIRIN 325 MG tablet Take  325 mg by mouth at bedtime.  10/03/19   [provider]  Diroximel Fumarate (VUMERITY) 231 MG CPDR Take 2 tablet in the morning and 2 tablets in the evening 02/06/21   [provider]  Diroximel Fumarate 231 MG CPDR Take 1 capsule by mouth twice daily for the first 7 days. Then, increase to 2 capsules by mouth twice daily thereafter. 12/05/19   [provider]  docusate sodium (COLACE) 100 MG capsule Take 1 capsule (100 mg total) by mouth every 12 (twelve) hours. 05/12/20   Palumbo, April, MD  dorzolamide-timolol (COSOPT) 22.3-6.8 MG/ML ophthalmic solution Place 1 drop into both eyes 2 (two) times daily. 09/21/19   [provider]  gabapentin (NEURONTIN) 300 MG capsule Take 300 mg by mouth 3 (three) times daily. 12/16/19   [provider]  polyethylene glycol (MIRALAX / GLYCOLAX) 17 g packet Take 17 g by mouth daily. 08/24/20   Curatolo, Adam, DO  predniSONE (DELTASONE) 20 MG tablet Take by mouth as needed. 12/14/20   [provider]  RHOPRESSA 0.02 % SOLN Place 1 drop into both eyes at bedtime.  08/04/19   [provider]  traZODone (DESYREL) 50 MG tablet  12/11/19   [provider]      Allergies    Doxycycline    Review of Systems   Review of Systems  Genitourinary:        Leaky catheter  All other  systems reviewed and are negative.   Physical Exam Updated Vital Signs BP (!) 165/91   Pulse 64   Temp 98.1 F (36.7 C)   Resp 18   SpO2 98%  Physical Exam Vitals and nursing note reviewed.  Constitutional:      Appearance: Normal appearance.  HENT:     Head: Normocephalic and atraumatic.     Mouth/Throat:     Mouth: Mucous membranes are moist.  Eyes:     Conjunctiva/sclera: Conjunctivae normal.     Pupils: Pupils are equal, round, and reactive to light.  Cardiovascular:     Rate and Rhythm: Normal rate and regular rhythm.     Pulses: Normal pulses.     Heart sounds: Normal heart sounds.  Pulmonary:     Effort:  Pulmonary effort is normal.     Breath sounds: Normal breath sounds.  Abdominal:     Palpations: Abdomen is soft.     Tenderness: There is no abdominal tenderness.  Genitourinary:    Penis: Normal.      Comments: No edema or erythema of penis at catheter site Skin:    General: Skin is warm and dry.     Findings: No rash.  Neurological:     General: No focal deficit present.     Mental Status: He is alert.  Psychiatric:        Mood and Affect: Mood normal.        Behavior: Behavior normal.     ED Results / Procedures / Treatments   Labs (all labs ordered are listed, but only abnormal results are displayed) Labs Reviewed  URINALYSIS, W/ REFLEX TO CULTURE (INFECTION SUSPECTED)    EKG None  Radiology No results found.  Procedures Procedures: not indicated. {Document cardiac monitor, telemetry assessment procedure when appropriate:1}  Medications Ordered in ED Medications  lidocaine (XYLOCAINE) 2 % jelly 1 Application (has no administration in time range)    ED Course/ Medical Decision Making/ A&P   {   Click here for ABCD2, HEART and other calculatorsREFRESH Note before signing :1}                          Medical Decision Making Amount and/or Complexity of Data Reviewed Labs: ordered.  Patient reports no leaking with new catheter placed in the ED today.   As discussed with Dr. Renaye Rakers, we will not initiate patient on antibiotic therapy for urinary infection at this time due to history of resistance. We will wait for culture results to initiate antibiotic therapy.  Discussed with patient and wife at bedside, they were stable to this plan.  Confirm pharmacy and with patient follow-up with results. He is feeling well and ready for discharge home. {Document critical care time when appropriate:1} {Document review of labs and clinical decision tools ie heart score, Chads2Vasc2 etc:1}  {Document your independent review of radiology images, and any outside  records:1} {Document your discussion with family members, caretakers, and with consultants:1} {Document social determinants of health affecting pt's care:1} {Document your decision making why or why not admission, treatments were needed:1} Final Clinical Impression(s) / ED Diagnoses Final diagnoses:  None    Rx / DC Orders ED Discharge Orders     None

## 2022-08-07 NOTE — ED Triage Notes (Signed)
Pt presents to ED Pov. Pt c/o catheter leaking. No pain but reports malodorous urine.

## 2022-08-07 NOTE — Discharge Instructions (Addendum)
As discussed, your urinalysis shows a probable bladder infection.  We will wait until the culture results come back to initiate antibiotics due to your history of resistance.  We will call you in 1-2 days to let you know your results and if we send you in a medication.  Call your PCP at the [facility] on Monday to let them know you were here and for further evaluation.   Get help right away if: You see blood in the catheter. Your pee is pink or red. Your bladder feels full. Your pee is not draining into the bag. Your catheter gets pulled out.

## 2022-08-07 NOTE — ED Notes (Signed)
Discharge instructions discussed with pt. Pt verbalized understanding. Pt stable and ambulatory.  °

## 2022-08-08 LAB — URINE CULTURE: Culture: >=100000 — AB

## 2022-08-09 LAB — URINE CULTURE

## 2022-08-11 ENCOUNTER — Other Ambulatory Visit: Payer: Self-pay | Admitting: Cardiology

## 2022-08-11 ENCOUNTER — Telehealth (HOSPITAL_BASED_OUTPATIENT_CLINIC_OR_DEPARTMENT_OTHER): Payer: Self-pay | Admitting: *Deleted

## 2022-08-11 NOTE — Telephone Encounter (Signed)
Post ED Visit - Positive Culture Follow-up: Successful Patient Follow-Up  Culture assessed and recommendations reviewed by:  [x]  Ivery Quale, Pharm.D. []  Celedonio Miyamoto, Pharm.D., BCPS AQ-ID []  Garvin Fila, Pharm.D., BCPS []  Georgina Pillion, Pharm.D., BCPS []  [place], [address].D., BCPS, AAHIVP []  Estella Husk, Pharm.D., BCPS, AAHIVP []  Lysle Pearl, PharmD, BCPS []  Phillips Climes, PharmD, BCPS []  Agapito Games, PharmD, BCPS []  Verlan Friends, PharmD  Positive urine culture  [x]  Patient discharged without antimicrobial prescription and treatment is now indicated []  Organism is resistant to prescribed ED discharge antimicrobial []  Patient with positive blood cultures  Changes discussed with ED provider: Maryanna Shape, PA-C New antibiotic prescription Macrobid 100mg  PO BID x 7 days [facility], [address], [phone]  Contacted patient, date 08/11/2022, time 1120   Lysle Pearl 08/11/2022, 11:17 AM

## 2022-09-18 ENCOUNTER — Emergency Department (HOSPITAL_BASED_OUTPATIENT_CLINIC_OR_DEPARTMENT_OTHER)
Admission: EM | Admit: 2022-09-18 | Discharge: 2022-09-18 | Disposition: A | Discharge status: Home / Self Care | Payer: No Typology Code available for payment source | Attending: Emergency Medicine | Admitting: Emergency Medicine

## 2022-09-18 ENCOUNTER — Encounter (HOSPITAL_BASED_OUTPATIENT_CLINIC_OR_DEPARTMENT_OTHER): Payer: Self-pay

## 2022-09-18 ENCOUNTER — Other Ambulatory Visit: Payer: Self-pay

## 2022-09-18 DIAGNOSIS — T83031A Leakage of indwelling urethral catheter, initial encounter: Secondary | ICD-10-CM | POA: Diagnosis present

## 2022-09-18 DIAGNOSIS — Y732 Prosthetic and other implants, materials and accessory gastroenterology and urology devices associated with adverse incidents: Secondary | ICD-10-CM | POA: Insufficient documentation

## 2022-09-18 DIAGNOSIS — N390 Urinary tract infection, site not specified: Secondary | ICD-10-CM

## 2022-09-18 LAB — URINALYSIS, ROUTINE W REFLEX MICROSCOPIC
Bilirubin Urine: NEGATIVE
Glucose, UA: NEGATIVE mg/dL
Ketones, ur: NEGATIVE mg/dL
Nitrite: POSITIVE — AB
Protein, ur: 100 mg/dL — AB
Specific Gravity, Urine: >=1.03 (ref 1.005–1.030)
pH: 5.5 (ref 5.0–8.0)

## 2022-09-18 LAB — URINALYSIS, MICROSCOPIC (REFLEX): WBC, UA: >50 WBC/hpf (ref 0–5)

## 2022-09-18 MED ORDER — NITROFURANTOIN MONOHYD MACRO 100 MG PO CAPS: 100.0000 mg | ORAL_CAPSULE | Freq: Two times a day (BID) | ORAL | 0 refills | Status: AC

## 2022-09-18 MED ORDER — NITROFURANTOIN MONOHYD MACRO 100 MG PO CAPS
100.0000 mg | ORAL_CAPSULE | Freq: Once | ORAL | Status: AC
  Administered 2022-09-18: 100 mg via ORAL
  Filled 2022-09-18: qty 1

## 2022-09-18 NOTE — ED Notes (Signed)
Pt. Needs a new catheter due to leakage around the one he has in.

## 2022-09-18 NOTE — ED Provider Notes (Signed)
[name] Provider Note   CSN: [id number] Arrival date & time: 09/18/22  1739     History Chief Complaint  Patient presents with   Urinary Retention    HPI Bryan Robertson is a 72 y.o. male presenting for chief complaint of leaking catheter.  States that he has had this happen and the catheter balloon popped.  Denies fevers chills nausea vomiting shortness of breath..   Patient's recorded medical, surgical, social, medication list and allergies were reviewed in the Snapshot window as part of the initial history.   Review of Systems   Review of Systems  Constitutional:  Negative for chills and fever.  HENT:  Negative for ear pain and sore throat.   Eyes:  Negative for pain and visual disturbance.  Respiratory:  Negative for cough and shortness of breath.   Cardiovascular:  Negative for chest pain and palpitations.  Gastrointestinal:  Negative for abdominal pain and vomiting.  Genitourinary:  Positive for frequency. Negative for dysuria and hematuria.  Musculoskeletal:  Negative for arthralgias and back pain.  Skin:  Negative for color change and rash.  Neurological:  Negative for seizures and syncope.  All other systems reviewed and are negative.   Physical Exam Updated Vital Signs BP (!) 151/79   Pulse 61   Temp 98 F (36.7 C) (Oral)   Resp 16   Ht 6' (1.829 m)   Wt 83 kg   SpO2 100%   BMI 24.82 kg/m  Physical Exam Vitals and nursing note reviewed.  Constitutional:      General: He is not in acute distress.    Appearance: He is well-developed.  HENT:     Head: Normocephalic and atraumatic.  Eyes:     Conjunctiva/sclera: Conjunctivae normal.  Cardiovascular:     Rate and Rhythm: Normal rate and regular rhythm.     Heart sounds: No murmur heard. Pulmonary:     Effort: Pulmonary effort is normal. No respiratory distress.     Breath sounds: Normal breath sounds.  Abdominal:     Palpations: Abdomen is  soft.     Tenderness: There is no abdominal tenderness.  Musculoskeletal:        General: No swelling.     Cervical back: Neck supple.  Skin:    General: Skin is warm and dry.     Capillary Refill: Capillary refill takes less than 2 seconds.  Neurological:     Mental Status: He is alert.  Psychiatric:        Mood and Affect: Mood normal.      ED Course/ Medical Decision Making/ A&P    Procedures Procedures   Medications Ordered in ED Medications  nitrofurantoin (macrocrystal-monohydrate) (MACROBID) capsule 100 mg (has no administration in time range)    Medical Decision Making:   72 year old Presenting with catheter complication.  Has been incontinent because of leakage around his catheter.  Denies fevers chills nausea vomiting shortness of breath.  Nurses interrogated catheter and urine was withdrawn when they tried to deflate the balloon.  Likely balloon failure.  Catheter was replaced easily in the emergency department.  Fresh urine off of the new catheter concerning for infection on the UA.  Looked at old cultures and he has susceptibility to [employer].  Hide know it is not age-ideal, however patient has narrow sensitivities.  Additionally he has normal kidney function as of 2 months ago on screening lab work.  He is a candidate for Macrobid therapy based on  these findings.  Disposition:  I have considered need for hospitalization, however, considering all of the above, I believe this patient is stable for discharge at this time.  Patient/family educated about specific return precautions for given chief complaint and symptoms.  Patient/family educated about follow-up with PCP.     Patient/family expressed understanding of return precautions and need for follow-up. Patient spoken to regarding all imaging and laboratory results and appropriate follow up for these results. All education provided in verbal form with additional information in written form. Time was allowed for  answering of patient questions. Patient discharged.    Emergency Department Medication Summary:   Medications  nitrofurantoin (macrocrystal-monohydrate) (MACROBID) capsule 100 mg (has no administration in time range)        Clinical Impression:  1. Urinary tract infection without hematuria, site unspecified      Discharge   Final Clinical Impression(s) / ED Diagnoses Final diagnoses:  Urinary tract infection without hematuria, site unspecified    Rx / DC Orders ED Discharge Orders          Ordered    nitrofurantoin, macrocrystal-monohydrate, (MACROBID) 100 MG capsule  2 times daily        09/18/22 2237              Glyn Ade, MD 09/18/22 2237

## 2022-09-18 NOTE — ED Notes (Signed)
Patient's urine bag was changed to a leg bag. Education on same was given with full understanding from the patient. Patient was able to stand and take a few steps to ensure comfort with the catheter placement. Patient given a brand new hanging bag for night time operations.

## 2022-09-18 NOTE — ED Triage Notes (Signed)
The patient stated his urinary catheter is leaking. He had to have it replaced last month for leaking. No other symptoms.

## 2022-09-24 LAB — URINE CULTURE

## 2022-09-25 ENCOUNTER — Telehealth (HOSPITAL_BASED_OUTPATIENT_CLINIC_OR_DEPARTMENT_OTHER): Payer: Self-pay

## 2022-09-25 NOTE — Telephone Encounter (Signed)
Post ED Visit - Positive Culture Follow-up  Culture report reviewed by antimicrobial stewardship pharmacist: [facility] [x]  Lora Paula, Pharm.D. []  Celedonio Miyamoto, Pharm.D., BCPS AQ-ID []  Garvin Fila, Pharm.D., BCPS []  Georgina Pillion, Pharm.D., BCPS []  [place], [address].D., BCPS, AAHIVP []  Estella Husk, Pharm.D., BCPS, AAHIVP []  Lysle Pearl, PharmD, BCPS []  Phillips Climes, PharmD, BCPS []  Agapito Games, PharmD, BCPS []  Verlan Friends, PharmD []  Mervyn Gay, PharmD, BCPS []  Vinnie Level, PharmD  [facility] []  Len Childs, PharmD []  Greer Pickerel, PharmD []  Adalberto Cole, PharmD []  Perlie Gold, Rph []  Lonell Face) Jean Rosenthal, PharmD []  Earl Many, PharmD []  Junita Push, PharmD []  Dorna Leitz, PharmD []  Terrilee Files, PharmD []  Lynann Beaver, PharmD []  Keturah Barre, PharmD []  Loralee Pacas, PharmD []  Bernadene Person, PharmD   Positive urine culture Treated with Nitrofurantoin Monohyd macro, organism sensitive to the same and no further patient follow-up is required at this time.  Sandria Senter 09/25/2022, 9:04 AM

## 2023-02-01 ENCOUNTER — Encounter (HOSPITAL_BASED_OUTPATIENT_CLINIC_OR_DEPARTMENT_OTHER): Payer: Self-pay

## 2023-02-01 ENCOUNTER — Emergency Department (HOSPITAL_BASED_OUTPATIENT_CLINIC_OR_DEPARTMENT_OTHER)
Admission: EM | Admit: 2023-02-01 | Discharge: 2023-02-01 | Disposition: A | Discharge status: Home / Self Care | Payer: No Typology Code available for payment source | Attending: Emergency Medicine | Admitting: Emergency Medicine

## 2023-02-01 ENCOUNTER — Other Ambulatory Visit: Payer: Self-pay

## 2023-02-01 DIAGNOSIS — R339 Retention of urine, unspecified: Secondary | ICD-10-CM | POA: Insufficient documentation

## 2023-02-01 DIAGNOSIS — I10 Essential (primary) hypertension: Secondary | ICD-10-CM | POA: Insufficient documentation

## 2023-02-01 DIAGNOSIS — Z79899 Other long term (current) drug therapy: Secondary | ICD-10-CM | POA: Insufficient documentation

## 2023-02-01 DIAGNOSIS — T83098A Other mechanical complication of other indwelling urethral catheter, initial encounter: Secondary | ICD-10-CM | POA: Diagnosis present

## 2023-02-01 DIAGNOSIS — T83091A Other mechanical complication of indwelling urethral catheter, initial encounter: Secondary | ICD-10-CM

## 2023-02-01 DIAGNOSIS — R1030 Lower abdominal pain, unspecified: Secondary | ICD-10-CM | POA: Diagnosis not present

## 2023-02-01 DIAGNOSIS — R319 Hematuria, unspecified: Secondary | ICD-10-CM

## 2023-02-01 LAB — CBC WITH DIFFERENTIAL/PLATELET
Abs Immature Granulocytes: 0.03 10*3/uL (ref 0.00–0.07)
Basophils Absolute: 0 10*3/uL (ref 0.0–0.1)
Basophils Relative: 0 %
Eosinophils Absolute: 0.1 10*3/uL (ref 0.0–0.5)
Eosinophils Relative: 1 %
HCT: 43.8 % (ref 39.0–52.0)
Hemoglobin: 14.6 g/dL (ref 13.0–17.0)
Immature Granulocytes: 0 %
Lymphocytes Relative: 17 %
Lymphs Abs: 1.6 10*3/uL (ref 0.7–4.0)
MCH: 30.4 pg (ref 26.0–34.0)
MCHC: 33.3 g/dL (ref 30.0–36.0)
MCV: 91.1 fL (ref 80.0–100.0)
Monocytes Absolute: 0.5 10*3/uL (ref 0.1–1.0)
Monocytes Relative: 5 %
Neutro Abs: 7.1 10*3/uL (ref 1.7–7.7)
Neutrophils Relative %: 77 %
Platelets: 201 10*3/uL (ref 150–400)
RBC: 4.81 MIL/uL (ref 4.22–5.81)
RDW: 13.3 % (ref 11.5–15.5)
WBC: 9.4 10*3/uL (ref 4.0–10.5)
nRBC: 0 % (ref 0.0–0.2)

## 2023-02-01 LAB — URINALYSIS, W/ REFLEX TO CULTURE (INFECTION SUSPECTED): RBC / HPF: >50 RBC/hpf (ref 0–5)

## 2023-02-01 LAB — BASIC METABOLIC PANEL
Anion gap: 6 (ref 5–15)
BUN: 19 mg/dL (ref 8–23)
CO2: 29 mmol/L (ref 22–32)
Calcium: 8.5 mg/dL — ABNORMAL LOW (ref 8.9–10.3)
Chloride: 104 mmol/L (ref 98–111)
Creatinine, Ser: 0.96 mg/dL (ref 0.61–1.24)
GFR, Estimated: >60 mL/min (ref >60)
Glucose, Bld: 128 mg/dL — ABNORMAL HIGH (ref 70–99)
Potassium: 3.7 mmol/L (ref 3.5–5.1)
Sodium: 139 mmol/L (ref 135–145)

## 2023-02-01 NOTE — Discharge Instructions (Addendum)
Your Foley catheter was likely not placed into the bladder at the [facility].  Make sure to follow-up outpatient, return for any worsening symptoms

## 2023-02-01 NOTE — ED Provider Notes (Cosign Needed)
[facility] Provider Note   CSN: [id number] Arrival date & time: 02/01/23  1858     History  Chief Complaint  Patient presents with   Urinary Retention    Bryan Robertson is a 72 y.o. male history of BPH with chronic indwelling Foley catheter, hypertension here for evaluation of requesting Foley catheter replacement.  He goes once a month at the [facility] to have this exchanged.  It was exchanged today.  He said he felt pain when they inserted the balloon.  He was told it was "normal."  He was subsequently sent home.  He states when he got home he had increased suprapubic pressure and pain and no output in his Foley catheter.  He subsequently came here to the emergency department.  No fever, nausea, vomiting, flank pain.  He has been eating and drinking without difficulty.  He is on a baby aspirin however no other anticoagulation.  HPI     Home Medications Prior to Admission medications   Medication Sig Start Date End Date Taking? Authorizing Provider  atorvastatin (LIPITOR) 20 MG tablet Take 20 mg by mouth at bedtime.  10/21/19   [provider]  baclofen (LIORESAL) 10 MG tablet Take 1 tablet by mouth 3 (three) times daily. 02/18/22   [provider]  brimonidine (ALPHAGAN) 0.2 % ophthalmic solution 1 drop 3 (three) times daily. 09/04/19   [provider]  buPROPion [facility] SR) 150 MG 12 hr tablet  12/11/19   [provider]  carvedilol (COREG) 12.5 MG tablet TAKE 1 TABLET BY MOUTH TWICE A DAY 08/11/22   Little Ishikawa, MD  carvedilol (COREG) 25 MG tablet Take 1 tablet (25 mg total) by mouth 2 (two) times daily. 03/26/22   Little Ishikawa, MD  clonazePAM (KLONOPIN) 0.5 MG tablet Take 0.5 mg by mouth daily as needed. 12/11/19   [provider]  [facility] ASPIRIN 325 MG tablet Take 325 mg by mouth at bedtime.  10/03/19   [provider]  Diroximel Fumarate (VUMERITY) 231 MG  CPDR Take 2 tablet in the morning and 2 tablets in the evening 02/06/21   [provider]  Diroximel Fumarate 231 MG CPDR Take 1 capsule by mouth twice daily for the first 7 days. Then, increase to 2 capsules by mouth twice daily thereafter. 12/05/19   [provider]  docusate sodium (COLACE) 100 MG capsule Take 1 capsule (100 mg total) by mouth every 12 (twelve) hours. 05/12/20   Palumbo, April, MD  dorzolamide-timolol (COSOPT) 22.3-6.8 MG/ML ophthalmic solution Place 1 drop into both eyes 2 (two) times daily. 09/21/19   [provider]  gabapentin (NEURONTIN) 300 MG capsule Take 300 mg by mouth 3 (three) times daily. 12/16/19   [provider]  nitrofurantoin, macrocrystal-monohydrate, (MACROBID) 100 MG capsule Take 1 capsule (100 mg total) by mouth 2 (two) times daily. 09/18/22   Glyn Ade, MD  polyethylene glycol (MIRALAX / GLYCOLAX) 17 g packet Take 17 g by mouth daily. 08/24/20   Curatolo, Adam, DO  predniSONE (DELTASONE) 20 MG tablet Take by mouth as needed. 12/14/20   [provider]  RHOPRESSA 0.02 % SOLN Place 1 drop into both eyes at bedtime.  08/04/19   [provider]  traZODone (DESYREL) 50 MG tablet  12/11/19   [provider]      Allergies    Doxycycline    Review of Systems   Review of Systems  Constitutional: Negative.  HENT: Negative.    Respiratory: Negative.    Cardiovascular: Negative.   Gastrointestinal: Negative.   Genitourinary:  Positive for decreased urine volume.  Musculoskeletal: Negative.   Skin: Negative.   Neurological: Negative.   All other systems reviewed and are negative.   Physical Exam Updated Vital Signs BP (!) 153/78   Pulse 67   Temp 97.8 F (36.6 C)   Resp 17   Wt 83.9 kg   SpO2 96%   BMI 25.09 kg/m  Physical Exam Vitals and nursing note reviewed.  Constitutional:      General: He is not in acute distress.    Appearance: He is well-developed. He is not ill-appearing,  toxic-appearing or diaphoretic.  HENT:     Head: Atraumatic.  Eyes:     Pupils: Pupils are equal, round, and reactive to light.  Cardiovascular:     Rate and Rhythm: Normal rate and regular rhythm.     Pulses: Normal pulses.     Heart sounds: Normal heart sounds.  Pulmonary:     Effort: Pulmonary effort is normal. No respiratory distress.     Breath sounds: Normal breath sounds.  Abdominal:     General: Bowel sounds are normal. There is no distension.     Palpations: Abdomen is soft.  Genitourinary:    Comments: Foley catheter at urethral meatus.  No obvious traumatic injuries.  Dark urine draining from bag Musculoskeletal:        General: Normal range of motion.     Cervical back: Normal range of motion and neck supple.  Skin:    General: Skin is warm and dry.  Neurological:     General: No focal deficit present.     Mental Status: He is alert and oriented to person, place, and time.     ED Results / Procedures / Treatments   Labs (all labs ordered are listed, but only abnormal results are displayed) Labs Reviewed  BASIC METABOLIC PANEL - Abnormal; Notable for the following components:      Result Value   Glucose, Bld 128 (*)    Calcium 8.5 (*)    All other components within normal limits  URINALYSIS, W/ REFLEX TO CULTURE (INFECTION SUSPECTED) - Abnormal; Notable for the following components:   Color, Urine RED (*)    APPearance TURBID (*)    Glucose, UA   (*)    Value: TEST NOT REPORTED DUE TO COLOR INTERFERENCE OF URINE PIGMENT   Hgb urine dipstick   (*)    Value: TEST NOT REPORTED DUE TO COLOR INTERFERENCE OF URINE PIGMENT   Bilirubin Urine   (*)    Value: TEST NOT REPORTED DUE TO COLOR INTERFERENCE OF URINE PIGMENT   Ketones, ur   (*)    Value: TEST NOT REPORTED DUE TO COLOR INTERFERENCE OF URINE PIGMENT   Protein, ur   (*)    Value: TEST NOT REPORTED DUE TO COLOR INTERFERENCE OF URINE PIGMENT   Nitrite   (*)    Value: TEST NOT REPORTED DUE TO COLOR  INTERFERENCE OF URINE PIGMENT   Leukocytes,Ua   (*)    Value: TEST NOT REPORTED DUE TO COLOR INTERFERENCE OF URINE PIGMENT   Bacteria, UA FEW (*)    All other components within normal limits  CBC WITH DIFFERENTIAL/PLATELET    EKG None  Radiology No results found.  Procedures Procedures    Medications Ordered in ED Medications - No data to display  ED Course/ Medical Decision Making/ A&P  72 year old here for evaluation urinary retention.  He has chronic [facility]elling [facility]  Had this replaced today per his usual monthly regimen with his Foley catheter was placed he noted there was pain on insertion.  He was told this was "normal" and was sent home.  He noted progressive suprapubic fullness and pain as well as no output in his Foley catheter bag, leading him here to the emergency department.  By the time I assessed patient nurses had deflated the balloon and replaced the Foley.  It appears that the balloon was inflated in the urethral meatus he does have some bloody urine in his Foley bag.  He is on a baby aspirin however no other anticoagulation.  He states he feels significantly better after his Foley catheter started working.  He denies any infectious symptoms.  Will plan on getting UA, checking basic labs.  Labs and imaging personally viewed and interpreted:  Bladder scan initially showed greater than 900 CBC without leukocytosis Metabolic panel glucose 128 UA with blood  Patient reassessed.  We irrigated his Foley catheter his urine is now running clear.  I suspect the blood in his urine was likely due to trauma from catheter placement at the [facility].  He is comfortable here.  We discussed not being able to fully interpret his urine here.  He states he did not have any urinary symptoms to suggest infection prior to this he would like to hold off on antibiotics at this time.  Will have him follow-up outpatient, return for any worsening  symptoms.  The patient has been appropriately medically screened and/or stabilized in the ED. I have low suspicion for any other emergent medical condition which would require further screening, evaluation or treatment in the ED or require inpatient management.  Patient is hemodynamically stable and in no acute distress.  Patient able to ambulate in department prior to ED.  Evaluation does not show acute pathology that would require ongoing or additional emergent interventions while in the emergency department or further inpatient treatment.  I have discussed the diagnosis with the patient and answered all questions.  Pain is been managed while in the emergency department and patient has no further complaints prior to discharge.  Patient is comfortable with plan discussed in room and is stable for discharge at this time.  I have discussed strict return precautions for returning to the emergency department.  Patient was encouraged to follow-up with PCP/specialist refer to at discharge.                                Medical Decision Making Amount and/or Complexity of Data Reviewed External Data Reviewed: labs and radiology. Labs: ordered. Decision-making details documented in ED Course. Radiology: ordered and independent interpretation performed. Decision-making details documented in ED Course.  Risk OTC drugs. Decision regarding hospitalization. Diagnosis or treatment significantly limited by social determinants of health.          Final Clinical Impression(s) / ED Diagnoses Final diagnoses:  Obstruction of Foley catheter, initial encounter [facility])    Rx / DC Orders ED Discharge Orders     None         Magdaleno Lortie A, PA-C 02/01/23 2257

## 2023-02-01 NOTE — ED Triage Notes (Signed)
Pt arrives with request to foley catheter replaced due to urinary retention. Pt had foley replaced today at [place]. After he had foley replaced he has not had any urinary output. He had catheter replaced at 9am. Pt reports lower ABD pain.

## 2023-02-01 NOTE — ED Notes (Signed)
Pt. In no distress at present time.  Pt. Has noted leg bag with bloody urine noted.  Pt. Reports he went to [place] today and the [facility] placed caused him pain and he never had any out put after the [facility] was placed.

## 2023-02-01 NOTE — ED Notes (Signed)
Flushed Pt. F/C per EDP order.  Pt. Tolerated well and F/C clear after flushed.   Pt. In no distress and having no spasms and states he feels good.  Cleaned Pt. With soap and water.

## 2023-03-08 ENCOUNTER — Other Ambulatory Visit: Payer: Self-pay

## 2023-03-08 MED ORDER — CARVEDILOL 12.5 MG PO TABS: 12.5000 mg | ORAL_TABLET | Freq: Two times a day (BID) | ORAL | 0 refills | Status: DC

## 2023-04-10 ENCOUNTER — Other Ambulatory Visit: Payer: Self-pay | Admitting: Cardiology

## 2023-04-12 NOTE — Telephone Encounter (Signed)
 Pt pharmacy is requesting refill. Pt med list states he is taking 25mg  and 12.5mg . Please address Thank you

## 2023-04-17 ENCOUNTER — Other Ambulatory Visit: Payer: Self-pay | Admitting: Cardiology

## 2023-05-05 ENCOUNTER — Encounter: Payer: Self-pay | Admitting: *Deleted

## 2023-05-21 ENCOUNTER — Other Ambulatory Visit: Payer: Self-pay | Admitting: Cardiology

## 2023-07-13 ENCOUNTER — Other Ambulatory Visit: Payer: Self-pay | Admitting: Cardiology

## 2023-07-26 NOTE — Progress Notes (Unsigned)
 Cardiology Office Note:    Date:  07/27/2023   ID:  Murle Otting, DOB 11/27/1950, MRN [id number]  PCP:  Salman, Faiza, MD  Cardiologist:  Lonni LITTIE Nanas, MD  Electrophysiologist:  None   Referring MD: Salman, Faiza, MD   Chief Complaint  Patient presents with   Cardiomyopathy   Follow-up     History of Present Illness:    Bryan Robertson is a 73 y.o. male with a hx of hypertension, hyperlipidemia, multiple sclerosis who presents for follow-up.  He was referred by Arland Arias, FNP for evaluation of hypertension on 12/01/2019.  He had hospitalization from 10/2 through 11/10/2019.  He had presented with right-sided weakness/numbness and gait difficulty.  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  He was treated with high-dose steroids with improvement.  He was seen by his PCP for lower extremity edema, lower extremity duplex on 10/17/2019 was negative for DVT.  Echocardiogram on 10/20/2019 showed EF 75%, severe LVH with echogenicity suggestive of infiltrative disease, normal RV size/function, moderate left atrial dilatation.  Labs on 10/31/2019 showed hemoglobin 16, platelets 243, creatinine 1.2, potassium 4.1, sodium 140, albumin 4.2, ferritin 166. Exercise Myoview in [place] on 05/25/2019 showed normal perfusion, EF 55%.  He denies any chest pain or dyspnea.  Does report intermittent lightheadedness but denies any syncope.  No palpitations.  He walks with a walker.  Reports intermittent lower extremity edema.  No smoking history.  Family history includes father died of MI in 62s.  Cardiac MRI on 12/25/2019 showed severe asymmetric LVH measuring up to 27 mm and basal anterior septum (7 mm and posterior wall), consistent with hypertrophic cardiomyopathy, RV insertion site LGE consistent with HCM, with LGE accounting for 6% total myocardial mass, normal biventricular function.  He had near syncopal episode had urology appointment on 01/01/2020 while doing a voiding  trial.  He was seen in the ED, thought to be vasovagal syncope.  Zio patch x7 days was done on 01/25/2020, which showed 124 episodes of SVT, longest lasting 3 minutes, 2 episodes of NSVT with longest lasting 6 beats, occasional PVCs (1.1%).  Echocardiogram on 04/03/2020 showed EF 65 to 70%, severe asymmetric LV hypertrophy of the basal septal segment consistent with HCM, no LVOT obstruction.  Since last clinic visit, he reports he is doing well.  Denies any chest pain, dyspnea, lightheadedness, syncope, or palpitations.  Reports occasional lower extremity edema.  His wife reports BP is typically 120s to 130s at home.   Wt Readings from Last 3 Encounters:  07/27/23 187 lb 9.6 oz (85.1 kg)  02/01/23 185 lb (83.9 kg)  09/18/22 182 lb 15.7 oz (83 kg)   BP Readings from Last 3 Encounters:  07/27/23 (!) 148/80  02/01/23 (!) 151/75  09/18/22 (!) 151/79     Past Medical History:  Diagnosis Date   Chronic indwelling Foley catheter    High cholesterol    Hypertension    Urinary retention     History reviewed. No pertinent surgical history.  Current Medications: Current Meds  Medication Sig   atorvastatin  (LIPITOR) 20 MG tablet Take 20 mg by mouth at bedtime.    baclofen (LIORESAL) 10 MG tablet Take 1 tablet by mouth 3 (three) times daily.   brimonidine  (ALPHAGAN ) 0.2 % ophthalmic solution 1 drop 3 (three) times daily.   buPROPion (WELLBUTRIN SR) 150 MG 12 hr tablet    carvedilol  (COREG ) 25 MG tablet TAKE 1 TABLET BY MOUTH TWICE A DAY   clonazePAM (KLONOPIN) 0.5 MG tablet  Take 0.5 mg by mouth daily as needed.   [facility] ASPIRIN  325 MG tablet Take 325 mg by mouth at bedtime.    Diroximel Fumarate (VUMERITY) 231 MG CPDR Take 2 tablet in the morning and 2 tablets in the evening   Diroximel Fumarate 231 MG CPDR Take 1 capsule by mouth twice daily for the first 7 days. Then, increase to 2 capsules by mouth twice daily thereafter.   docusate sodium  (COLACE) 100 MG capsule Take 1 capsule (100 mg total)  by mouth every 12 (twelve) hours.   dorzolamide -timolol  (COSOPT ) 22.3-6.8 MG/ML ophthalmic solution Place 1 drop into both eyes 2 (two) times daily.   gabapentin (NEURONTIN) 300 MG capsule Take 300 mg by mouth 3 (three) times daily.   nitrofurantoin , macrocrystal-monohydrate, (MACROBID ) 100 MG capsule Take 1 capsule (100 mg total) by mouth 2 (two) times daily.   polyethylene glycol (MIRALAX  / GLYCOLAX ) 17 g packet Take 17 g by mouth daily.   predniSONE (DELTASONE) 20 MG tablet Take by mouth as needed.   RHOPRESSA  0.02 % SOLN Place 1 drop into both eyes at bedtime.    traZODone (DESYREL) 50 MG tablet      Allergies:   Doxycycline   Social History   Socioeconomic History   Marital status: Married    Spouse name: Not on file   Number of children: Not on file   Years of education: Not on file   Highest education level: Not on file  Occupational History   Not on file  Tobacco Use   Smoking status: Never   Smokeless tobacco: Never  Vaping Use   Vaping status: Never Used  Substance and Sexual Activity   Alcohol use: Not Currently   Drug use: Not Currently   Sexual activity: Not Currently  Other Topics Concern   Not on file  Social History Narrative   Not on file   Social Drivers of Health   Financial Resource Strain: Not on file  Food Insecurity: Not on file  Transportation Needs: Not on file  Physical Activity: Not on file  Stress: Not on file  Social Connections: Moderately Integrated (11/21/2021)   Received from [facility]   Social Connection and Isolation Panel    In a typical week, how many times do you talk on the phone with family, friends, or neighbors?: More than three times a week    How often do you get together with friends or relatives?: More than three times a week    How often do you attend church or religious services?: More than 4 times per year    Do you belong to any clubs or organizations such as church groups, unions, fraternal or  athletic groups, or school groups?: No    How often do you attend meetings of the clubs or organizations you belong to?: Never    Are you married, widowed, divorced, separated, never married, or living with a partner?: Married     Family History: Father died of MI in 85s  ROS:   Please see the history of present illness.     All other systems reviewed and are negative.  EKGs/Labs/Other Studies Reviewed:    The following studies were reviewed today:   EKG:   04/07/21: Normal sinus rhythm, rate 62, less than 1 mm ST depressions in leads I, 2, 3, aVF, V5-6, T wave inversions in leads V1-5, QTc 442 03/26/22: Normal sinus rhythm, LVH with repolarization abnormalities, rate 77 07/27/2023: Normal sinus rhythm, LVH with repolarization  abnormalities, rate 64   Recent Labs: 02/01/2023: BUN 19; Creatinine, Ser 0.96; Hemoglobin 14.6; Platelets 201; Potassium 3.7; Sodium 139  Recent Lipid Panel No results found for: CHOL, TRIG, HDL, CHOLHDL, VLDL, LDLCALC, LDLDIRECT  Physical Exam:    VS:  BP (!) 148/80 (BP Location: Left Arm, Patient Position: Sitting, Cuff Size: Normal)   Pulse 69   Resp 16   Ht 6' (1.829 m)   Wt 187 lb 9.6 oz (85.1 kg)   SpO2 96%   BMI 25.44 kg/m     Wt Readings from Last 3 Encounters:  07/27/23 187 lb 9.6 oz (85.1 kg)  02/01/23 185 lb (83.9 kg)  09/18/22 182 lb 15.7 oz (83 kg)     GEN:  in no acute distress HEENT: Normal NECK: No JVD; No carotid bruits CARDIAC:RRR, no murmurs, rubs, gallops RESPIRATORY:  Clear to auscultation without rales, wheezing or rhonchi  ABDOMEN: Soft, non-tender, non-distended MUSCULOSKELETAL: Trace edema; No deformity  SKIN: Warm and dry NEUROLOGIC:  Alert and oriented x 3 PSYCHIATRIC:  Normal affect   ASSESSMENT:    1. Hypertrophic cardiomyopathy (HCC)   2. SVT (supraventricular tachycardia) (HCC)   3. Essential hypertension   4. Hyperlipidemia, unspecified hyperlipidemia type       PLAN:    HCM: Cardiac  MRI on 12/25/2019 showed severe asymmetric LVH measuring up to 27 mm in basal anterior septum (7 mm in posterior wall), consistent with hypertrophic cardiomyopathy, RV insertion site LGE consistent with HCM, with LGE accounting for 6% total myocardial mass, normal biventricular function.  Echocardiogram 04/03/2020 shows no LVOT obstruction. -Recommended daughters be screened for HCM -Given near syncopal episode, which suspect was vasovagal during voiding trial, Zio patch x7 days was done 01/2020: showed 2 episodes of NSVT lasting 6 beats. Given HCM with NSVT but age greater than 1, no indication for ICD at this time - Update echocardiogram  SVT: Zio patch x7 days was done on 01/25/2020, which showed 124 episodes of SVT, longest lasting 3 minutes -Continue carvedilol    Hypertension: Continue carvedilol  25 mg twice daily.  BP elevated in clinic today but reports has been under good control at home.  Asked to check BP daily for next week and let us  know results  Hyperlipidemia: On atorvastatin  20 mg daily.  LDL 73 on 11/2020  Multiple sclerosis: Follows with neurology at [facility].  RTC in 1 year   Medication Adjustments/Labs and Tests Ordered: Current medicines are reviewed at length with the patient today.  Concerns regarding medicines are outlined above.  Orders Placed This Encounter  Procedures   EKG 12-Lead   ECHOCARDIOGRAM COMPLETE    No orders of the defined types were placed in this encounter.    Patient Instructions  Medication Instructions:  Continue current medications *If you need a refill on your cardiac medications before your next appointment, please call your pharmacy*  Lab Work: Echo  Your physician has requested that you have an echocardiogram. Echocardiography is a painless test that uses sound waves to create images of your heart. It provides your doctor with information about the size and shape of your heart and how well your heart's chambers and valves are  working. This procedure takes approximately one hour. There are no restrictions for this procedure. Please do NOT wear cologne, perfume, aftershave, or lotions (deodorant is allowed). Please arrive 15 minutes prior to your appointment time.  Please note: We ask at that you not bring children with you during ultrasound (echo/ vascular) testing. Due to room  size and safety concerns, children are not allowed in the ultrasound rooms during exams. Our front office staff cannot provide observation of children in our lobby area while testing is being conducted. An adult accompanying a patient to their appointment will only be allowed in the ultrasound room at the discretion of the ultrasound technician under special circumstances. We apologize for any inconvenience.  If you have labs (blood work) drawn today and your tests are completely normal, you will receive your results only by: MyChart Message (if you have MyChart) OR A paper copy in the mail If you have any lab test that is abnormal or we need to change your treatment, we will call you to review the results.  Testing/Procedures: none  Follow-Up: At [facility], you and your health needs are our priority.  As part of our continuing mission to provide you with exceptional heart care, our providers are all part of one team.  This team includes your primary Cardiologist (physician) and Advanced Practice Providers or APPs (Physician Assistants and Nurse Practitioners) who all work together to provide you with the care you need, when you need it.  Your next appointment:   1 year(s)  Provider:   Lonni LITTIE Nanas, MD    We recommend signing up for the patient portal called MyChart.  Sign up information is provided on this After Visit Summary.  MyChart is used to connect with patients for Virtual Visits (Telemedicine).  Patients are able to view lab/test results, encounter notes, upcoming appointments, etc.  Non-urgent messages can be  sent to your provider as well.   To learn more about what you can do with MyChart, go to [email or web address].   Other Instructions Please check blood pressure once a week and report those reading       Signed, Lonni LITTIE Nanas, MD  07/27/2023 3:35 PM    [facility]

## 2023-07-27 ENCOUNTER — Ambulatory Visit: Attending: Cardiology | Admitting: Cardiology

## 2023-07-27 ENCOUNTER — Encounter: Payer: Self-pay | Admitting: Cardiology

## 2023-07-27 VITALS — BP 148/80 | HR 69 | Resp 16 | Ht 72.0 in | Wt 187.6 lb

## 2023-07-27 DIAGNOSIS — I10 Essential (primary) hypertension: Secondary | ICD-10-CM

## 2023-07-27 DIAGNOSIS — I422 Other hypertrophic cardiomyopathy: Secondary | ICD-10-CM

## 2023-07-27 DIAGNOSIS — I471 Supraventricular tachycardia, unspecified: Secondary | ICD-10-CM

## 2023-07-27 DIAGNOSIS — E785 Hyperlipidemia, unspecified: Secondary | ICD-10-CM | POA: Diagnosis not present

## 2023-07-27 NOTE — Patient Instructions (Signed)
 Medication Instructions:  Continue current medications *If you need a refill on your cardiac medications before your next appointment, please call your pharmacy*  Lab Work: Echo  Your physician has requested that you have an echocardiogram. Echocardiography is a painless test that uses sound waves to create images of your heart. It provides your doctor with information about the size and shape of your heart and how well your heart's chambers and valves are working. This procedure takes approximately one hour. There are no restrictions for this procedure. Please do NOT wear cologne, perfume, aftershave, or lotions (deodorant is allowed). Please arrive 15 minutes prior to your appointment time.  Please note: We ask at that you not bring children with you during ultrasound (echo/ vascular) testing. Due to room size and safety concerns, children are not allowed in the ultrasound rooms during exams. Our front office staff cannot provide observation of children in our lobby area while testing is being conducted. An adult accompanying a patient to their appointment will only be allowed in the ultrasound room at the discretion of the ultrasound technician under special circumstances. We apologize for any inconvenience.  If you have labs (blood work) drawn today and your tests are completely normal, you will receive your results only by: MyChart Message (if you have MyChart) OR A paper copy in the mail If you have any lab test that is abnormal or we need to change your treatment, we will call you to review the results.  Testing/Procedures: none  Follow-Up: At [facility], you and your health needs are our priority.  As part of our continuing mission to provide you with exceptional heart care, our providers are all part of one team.  This team includes your primary Cardiologist (physician) and Advanced Practice Providers or APPs (Physician Assistants and Nurse Practitioners) who all work  together to provide you with the care you need, when you need it.  Your next appointment:   1 year(s)  Provider:   Lonni LITTIE Nanas, MD    We recommend signing up for the patient portal called MyChart.  Sign up information is provided on this After Visit Summary.  MyChart is used to connect with patients for Virtual Visits (Telemedicine).  Patients are able to view lab/test results, encounter notes, upcoming appointments, etc.  Non-urgent messages can be sent to your provider as well.   To learn more about what you can do with MyChart, go to [email or web address].   Other Instructions Please check blood pressure once a week and report those reading

## 2023-08-05 ENCOUNTER — Other Ambulatory Visit: Payer: Self-pay | Admitting: Cardiology

## 2023-09-13 ENCOUNTER — Ambulatory Visit (HOSPITAL_COMMUNITY)
Admission: RE | Admit: 2023-09-13 | Discharge: 2023-09-13 | Disposition: A | Discharge status: Home / Self Care | Source: Ambulatory Visit | Attending: Cardiology | Admitting: Cardiology

## 2023-09-13 DIAGNOSIS — I421 Obstructive hypertrophic cardiomyopathy: Secondary | ICD-10-CM | POA: Diagnosis not present

## 2023-09-13 DIAGNOSIS — I422 Other hypertrophic cardiomyopathy: Secondary | ICD-10-CM | POA: Insufficient documentation

## 2023-09-13 LAB — ECHOCARDIOGRAM COMPLETE
Area-P 1/2: 2.42 cm2
S' Lateral: 3.4 cm

## 2023-09-14 ENCOUNTER — Ambulatory Visit: Payer: Self-pay | Admitting: Cardiology
